# Patient Record
Sex: Female | Born: 1940 | Race: Black or African American | Hispanic: No | Smoking: Never smoker
Health system: Southern US, Community
[De-identification: ages and names within clinical notes are randomized; demographics above are authoritative.]

## PROBLEM LIST (undated history)

## (undated) DIAGNOSIS — M1990 Unspecified osteoarthritis, unspecified site: Secondary | ICD-10-CM

## (undated) DIAGNOSIS — E669 Obesity, unspecified: Secondary | ICD-10-CM

## (undated) DIAGNOSIS — R7303 Prediabetes: Secondary | ICD-10-CM

## (undated) DIAGNOSIS — H409 Unspecified glaucoma: Secondary | ICD-10-CM

## (undated) DIAGNOSIS — N6019 Diffuse cystic mastopathy of unspecified breast: Secondary | ICD-10-CM

## (undated) DIAGNOSIS — Z1211 Encounter for screening for malignant neoplasm of colon: Secondary | ICD-10-CM

## (undated) DIAGNOSIS — I10 Essential (primary) hypertension: Secondary | ICD-10-CM

## (undated) DIAGNOSIS — T7840XA Allergy, unspecified, initial encounter: Secondary | ICD-10-CM

## (undated) HISTORY — DX: Diffuse cystic mastopathy of unspecified breast: N60.19

## (undated) HISTORY — PX: BREAST BIOPSY: SHX20

## (undated) HISTORY — DX: Obesity, unspecified: E66.9

## (undated) HISTORY — DX: Unspecified glaucoma: H40.9

## (undated) HISTORY — DX: Unspecified osteoarthritis, unspecified site: M19.90

## (undated) HISTORY — DX: Allergy, unspecified, initial encounter: T78.40XA

## (undated) HISTORY — PX: TUBAL LIGATION: SHX77

## (undated) HISTORY — DX: Encounter for screening for malignant neoplasm of colon: Z12.11

---

## 1995-01-05 HISTORY — PX: OTHER SURGICAL HISTORY: SHX169

## 2004-11-30 ENCOUNTER — Ambulatory Visit: Payer: Self-pay

## 2005-11-02 ENCOUNTER — Ambulatory Visit: Payer: Self-pay | Admitting: Family Medicine

## 2006-01-18 ENCOUNTER — Other Ambulatory Visit: Payer: Self-pay

## 2006-01-18 ENCOUNTER — Ambulatory Visit: Payer: Self-pay | Admitting: General Surgery

## 2006-01-25 ENCOUNTER — Ambulatory Visit: Payer: Self-pay | Admitting: General Surgery

## 2006-11-08 ENCOUNTER — Ambulatory Visit: Payer: Self-pay | Admitting: General Surgery

## 2007-11-13 ENCOUNTER — Ambulatory Visit: Payer: Self-pay

## 2008-01-05 HISTORY — PX: COLONOSCOPY: SHX174

## 2008-04-03 ENCOUNTER — Ambulatory Visit: Payer: Self-pay

## 2008-06-14 ENCOUNTER — Ambulatory Visit: Payer: Self-pay | Admitting: Gastroenterology

## 2008-11-14 ENCOUNTER — Ambulatory Visit: Payer: Self-pay | Admitting: General Surgery

## 2009-11-17 ENCOUNTER — Ambulatory Visit: Payer: Self-pay | Admitting: General Surgery

## 2010-02-04 ENCOUNTER — Ambulatory Visit: Payer: Self-pay | Admitting: Family

## 2010-11-19 ENCOUNTER — Ambulatory Visit: Payer: Self-pay | Admitting: Family

## 2011-12-16 ENCOUNTER — Ambulatory Visit: Payer: Self-pay | Admitting: General Surgery

## 2012-05-19 ENCOUNTER — Other Ambulatory Visit: Payer: Self-pay | Admitting: Family Medicine

## 2012-05-19 LAB — LIPID PANEL
Cholesterol: 136 mg/dL (ref 0–200)
Ldl Cholesterol, Calc: 85 mg/dL (ref 0–100)
VLDL Cholesterol, Calc: 13 mg/dL (ref 5–40)

## 2012-05-19 LAB — BASIC METABOLIC PANEL
Anion Gap: 4 — ABNORMAL LOW (ref 7–16)
BUN: 10 mg/dL (ref 7–18)
Chloride: 107 mmol/L (ref 98–107)
Co2: 29 mmol/L (ref 21–32)
EGFR (African American): >60
Glucose: 95 mg/dL (ref 65–99)
Potassium: 4 mmol/L (ref 3.5–5.1)
Sodium: 140 mmol/L (ref 136–145)

## 2012-07-03 ENCOUNTER — Encounter: Payer: Self-pay | Admitting: *Deleted

## 2012-11-27 ENCOUNTER — Telehealth: Payer: Self-pay

## 2012-11-27 NOTE — Telephone Encounter (Signed)
Patient will follow up with her PCP from now on.

## 2012-11-27 NOTE — Telephone Encounter (Signed)
Message copied by Sinda Du on Mon Nov 27, 2012  8:51 AM ------      Message from: Kieth Brightly      Created: Mon Nov 27, 2012  8:33 AM      Regarding: RE: recalls       Please document this. Ok for her to follow with PCP      ----- Message -----         From: Sinda Du, LPN         Sent: 11/20/2012  10:31 AM           To: Kieth Brightly, MD      Subject: recalls                                                  Patient was in recalls for a yearly follow up screening mammogram. Patient called and says that she will be seeing her PCP Dr. Nira Conn at [facility] from now on for her breast exams and mammograms.       ------

## 2012-12-21 ENCOUNTER — Ambulatory Visit: Payer: Self-pay | Admitting: General Surgery

## 2013-01-04 HISTORY — PX: BREAST CYST ASPIRATION: SHX578

## 2013-01-05 LAB — HM DIABETES EYE EXAM

## 2013-01-24 ENCOUNTER — Ambulatory Visit: Payer: Self-pay | Admitting: Family Medicine

## 2013-01-24 LAB — HM MAMMOGRAPHY: HM Mammogram: NORMAL

## 2013-07-15 ENCOUNTER — Ambulatory Visit: Payer: Self-pay | Admitting: Physician Assistant

## 2013-07-15 LAB — URINALYSIS, COMPLETE
BILIRUBIN, UR: NEGATIVE
GLUCOSE, UR: NEGATIVE mg/dL (ref 0–75)
KETONE: NEGATIVE
LEUKOCYTE ESTERASE: NEGATIVE
Nitrite: NEGATIVE
Ph: 6 (ref 4.5–8.0)
Specific Gravity: 1.025 (ref 1.003–1.030)

## 2013-07-17 LAB — URINE CULTURE

## 2013-09-13 ENCOUNTER — Encounter: Payer: Self-pay | Admitting: *Deleted

## 2013-09-25 ENCOUNTER — Other Ambulatory Visit: Payer: Medicare Other

## 2013-09-25 ENCOUNTER — Encounter: Payer: Self-pay | Admitting: General Surgery

## 2013-09-25 ENCOUNTER — Ambulatory Visit (INDEPENDENT_AMBULATORY_CARE_PROVIDER_SITE_OTHER): Payer: Medicare Other | Admitting: General Surgery

## 2013-09-25 VITALS — BP 130/90 | HR 80 | Resp 14 | Ht 64.0 in | Wt 188.0 lb

## 2013-09-25 DIAGNOSIS — N644 Mastodynia: Secondary | ICD-10-CM

## 2013-09-25 NOTE — Patient Instructions (Signed)
Continue self breast exams. Call office for any new breast issues or concerns. 

## 2013-09-25 NOTE — Progress Notes (Signed)
[name], female   DOB: Sep 25, 1940, 73 y.o.   MRN: [id number]  Chief Complaint  Patient presents with  . Other    left breast soreness    HPI Natalie Gibbs is a 73 y.o. female.  who presents for a breast evaluation. The most recent mammogram was done in Jan 2015.  Patient does perform regular self breast checks and gets regular mammograms done.  States she is having tenderness in the left breast for about a week. States she can't feel anything different in the breast. Family history of breast cancer includes a niece.  She has a history of breast cyst and a core biopsy that was benign.  HPI  Past Medical History  Diagnosis Date  . Allergy   . Glaucoma   . Arthritis   . Obesity, unspecified   . Diabetes mellitus without complication   . Diffuse cystic mastopathy   . Special screening for malignant neoplasms, colon     Past Surgical History  Procedure Laterality Date  . Breast biopsy Left 2008    benign  . Tubal ligation    . Excision mass abdominal wall  1997  . Colonoscopy  2010    Dr. Candace Cruise, [facility]    Family History  Problem Relation Age of Onset  . Cancer Other     niece/breast    Social History History  Substance Use Topics  . Smoking status: Never Smoker   . Smokeless tobacco: Never Used  . Alcohol Use: No    Allergies  Allergen Reactions  . Iodine Itching  . Shellfish Allergy Itching  . Codeine Rash    Current Outpatient Prescriptions  Medication Sig Dispense Refill  . aspirin 81 MG tablet Take 81 mg by mouth daily.      . dorzolamide (TRUSOPT) 2 % ophthalmic solution 1 drop 3 (three) times daily.      . Travoprost, BAK Free, (TRAVATAN) 0.004 % SOLN ophthalmic solution Place 1 drop into both eyes at bedtime.       No current facility-administered medications for this visit.    Review of Systems Review of Systems  Constitutional: Negative.   Respiratory: Negative.   Cardiovascular: Negative.     Blood pressure 130/90,  pulse 80, resp. rate 14, height 5\' 4"  (1.626 m), weight 188 lb (85.276 kg).  Physical Exam Physical Exam  Constitutional: She is oriented to person, place, and time. She appears well-developed and well-nourished.  Eyes: Conjunctivae are normal. No scleral icterus.  Neck: Neck supple.  Cardiovascular: Normal rate, regular rhythm and normal heart sounds.   Pulmonary/Chest: Effort normal and breath sounds normal. Right breast exhibits no inverted nipple, no mass, no nipple discharge, no skin change and no tenderness. Left breast exhibits no inverted nipple, no mass, no nipple discharge, no skin change and no tenderness.  Her soreness is above the left nipple region  Lymphadenopathy:    She has no cervical adenopathy.    She has no axillary adenopathy.  Neurological: She is alert and oriented to person, place, and time.  Skin: Skin is warm and dry.  multiple keratotic lesions over her trunk    Data Reviewed Mammogram from January was reviewed showing prior biopsy clip above the left nipple but no abnormality otherwise.  Assessment    Stable physical exam. Ultrasound today shows a 4 mm shadowing area just above the left nipple and a 4 -5 mm hypoechoic irregular mass 3 cm above left nipple.[name]    Plan  Follow up office visit with left mammogram.       SANKAR,SEEPLAPUTHUR G 09/25/2013, 11:51 AM

## 2013-09-27 ENCOUNTER — Encounter: Payer: Self-pay | Admitting: General Surgery

## 2013-10-08 ENCOUNTER — Other Ambulatory Visit: Payer: Medicare Other

## 2013-10-08 ENCOUNTER — Encounter: Payer: Self-pay | Admitting: General Surgery

## 2013-10-08 ENCOUNTER — Ambulatory Visit (INDEPENDENT_AMBULATORY_CARE_PROVIDER_SITE_OTHER): Payer: Medicare Other | Admitting: General Surgery

## 2013-10-08 VITALS — BP 110/70 | HR 68 | Resp 12 | Ht 64.0 in | Wt 190.0 lb

## 2013-10-08 DIAGNOSIS — N63 Unspecified lump in breast: Secondary | ICD-10-CM

## 2013-10-08 DIAGNOSIS — N6320 Unspecified lump in the left breast, unspecified quadrant: Secondary | ICD-10-CM

## 2013-10-08 NOTE — Patient Instructions (Signed)

## 2013-10-08 NOTE — Progress Notes (Signed)
[name], female   DOB: October 22, 1940, 73 y.o.   MRN: [id number]  Chief Complaint  Patient presents with  . Follow-up    mammogram    HPI Natalie Gibbs is a 73 y.o. female who presents for a breast evaluation. She has a history of breast cyst and a core biopsy that was benign.     HPI  Past Medical History  Diagnosis Date  . Allergy   . Glaucoma   . Arthritis   . Obesity, unspecified   . Diabetes mellitus without complication   . Diffuse cystic mastopathy   . Special screening for malignant neoplasms, colon     Past Surgical History  Procedure Laterality Date  . Breast biopsy Left 2008    benign  . Tubal ligation    . Excision mass abdominal wall  1997  . Colonoscopy  2010    Dr. Candace Cruise, [facility]    Family History  Problem Relation Age of Onset  . Cancer Other     niece/breast    Social History History  Substance Use Topics  . Smoking status: Never Smoker   . Smokeless tobacco: Never Used  . Alcohol Use: No    Allergies  Allergen Reactions  . Iodine Itching  . Shellfish Allergy Itching  . Codeine Rash    Current Outpatient Prescriptions  Medication Sig Dispense Refill  . aspirin 81 MG tablet Take 81 mg by mouth daily.      . dorzolamide (TRUSOPT) 2 % ophthalmic solution 1 drop 3 (three) times daily.      . Travoprost, BAK Free, (TRAVATAN) 0.004 % SOLN ophthalmic solution Place 1 drop into both eyes at bedtime.       No current facility-administered medications for this visit.    Review of Systems Review of Systems  Constitutional: Negative.   Respiratory: Negative.   Cardiovascular: Negative.     Blood pressure 110/70, pulse 68, resp. rate 12, height 5\' 4"  (1.626 m), weight 190 lb (86.183 kg).  Physical Exam Physical Exam Unchanged from 09/25/13 Data Reviewed Left mammogram- no findings to correlate with [place]  Assessment  Patient was seen on 09/25/13 with complaint of pain above left nipple. Exam was normal. Ultrasound showed  a small shadowing mass above left nipple and a second small hypoechoic mass 3 cm from nipple. Left diagnostic mammogram is normal. Core biopsy of the shadowing mass recommended and patient agreeable. The second 14mm hypoechoic mass can be followed.    Plan    Core biopsy completed today. If path is benign will recheck in 3 mos.       Criss Alvine F 10/08/2013, 9:15 AM

## 2013-10-10 ENCOUNTER — Telehealth: Payer: Self-pay | Admitting: *Deleted

## 2013-10-10 LAB — PATHOLOGY

## 2013-10-10 NOTE — Telephone Encounter (Signed)
Message copied by Carson Myrtle on Wed Oct 10, 2013  3:56 PM ------      Message from: Christene Lye      Created: Wed Oct 10, 2013  1:13 PM       Please let pt pt know the pathology was normal. F/u as scheduled ------

## 2013-10-11 NOTE — Telephone Encounter (Signed)
Notified patient as instructed, patient pleased. Discussed follow-up appointments, patient agrees  

## 2013-10-15 ENCOUNTER — Ambulatory Visit (INDEPENDENT_AMBULATORY_CARE_PROVIDER_SITE_OTHER): Payer: Self-pay | Admitting: *Deleted

## 2013-10-15 DIAGNOSIS — N6320 Unspecified lump in the left breast, unspecified quadrant: Secondary | ICD-10-CM

## 2013-10-15 DIAGNOSIS — N63 Unspecified lump in breast: Secondary | ICD-10-CM

## 2013-10-15 NOTE — Progress Notes (Signed)
Patient here today for follow up post left breast biopsy.  Dressing removed, steristrip in place and aware it may come off in one week.  Minimal bruising noted.  The patient is aware that a heating pad may be used for comfort as needed.  Aware of pathology. Follow up as scheduled. 

## 2013-11-05 ENCOUNTER — Encounter: Payer: Self-pay | Admitting: General Surgery

## 2013-11-23 ENCOUNTER — Ambulatory Visit: Payer: Self-pay | Admitting: Gastroenterology

## 2014-01-16 ENCOUNTER — Ambulatory Visit: Payer: Medicare Other | Admitting: General Surgery

## 2014-01-22 ENCOUNTER — Other Ambulatory Visit: Payer: Commercial Managed Care - HMO

## 2014-01-22 ENCOUNTER — Encounter: Payer: Self-pay | Admitting: General Surgery

## 2014-01-22 ENCOUNTER — Ambulatory Visit (INDEPENDENT_AMBULATORY_CARE_PROVIDER_SITE_OTHER): Payer: Commercial Managed Care - HMO | Admitting: General Surgery

## 2014-01-22 VITALS — BP 130/70 | HR 82 | Resp 14 | Ht 64.0 in | Wt 188.0 lb

## 2014-01-22 DIAGNOSIS — N6002 Solitary cyst of left breast: Secondary | ICD-10-CM

## 2014-01-22 NOTE — Patient Instructions (Signed)
Continue self breast exams. Call office for any new breast issues or concerns. 

## 2014-01-22 NOTE — Progress Notes (Signed)
[name], female   DOB: [birth year], 74 y.o.   MRN: [id number]  Chief Complaint  Patient presents with  . Follow-up    HPI Natalie Gibbs is a 74 y.o. female.  Here today for follow up left breast ultrasound. She was having pain above the left nipple which has resolved. She has a history of breast cyst and a core biopsy that was benign. No new breast issues.  HPI  Past Medical History  Diagnosis Date  . Allergy   . Glaucoma   . Arthritis   . Obesity, unspecified   . Diabetes mellitus without complication   . Diffuse cystic mastopathy   . Special screening for malignant neoplasms, colon     Past Surgical History  Procedure Laterality Date  . Breast biopsy Left 2008    benign  . Tubal ligation    . Excision mass abdominal wall  1997  . Colonoscopy  2010    Dr. Candace Cruise, [facility]    Family History  Problem Relation Age of Onset  . Cancer Other     niece/breast    Social History History  Substance Use Topics  . Smoking status: Never Smoker   . Smokeless tobacco: Never Used  . Alcohol Use: No    Allergies  Allergen Reactions  . Iodine Itching  . Shellfish Allergy Itching  . Codeine Rash    Current Outpatient Prescriptions  Medication Sig Dispense Refill  . aspirin 81 MG tablet Take 81 mg by mouth daily.    . dorzolamide (TRUSOPT) 2 % ophthalmic solution 1 drop 3 (three) times daily.    . Travoprost, BAK Free, (TRAVATAN) 0.004 % SOLN ophthalmic solution Place 1 drop into both eyes at bedtime.     No current facility-administered medications for this visit.    Review of Systems Review of Systems  Constitutional: Negative.   Respiratory: Negative.   Cardiovascular: Negative.     Blood pressure 130/70, pulse 82, resp. rate 14, height 5\' 4"  (1.626 m), weight 188 lb (85.276 kg).  Physical Exam Physical Exam  Constitutional: She is oriented to person, place, and time. She appears well-developed and well-nourished.  Pulmonary/Chest: Right  breast exhibits no inverted nipple, no mass, no nipple discharge, no skin change and no tenderness. Left breast exhibits no inverted nipple, no mass, no nipple discharge, no skin change and no tenderness.  Lymphadenopathy:    She has no axillary adenopathy.  Neurological: She is alert and oriented to person, place, and time.  Skin: Skin is warm and dry.    Data Reviewed Previous breast ultrasound.   Assessment    Stable physical exam. [place] left breast repeated today. There is a 67mm cystic structure 1cm above nipple-at site of core biopsy done 3 mos ago-path stromal fibrosis.  3cm above nipple an irregular hypo to anochoic mass 7 by 5 mm is noted with some enhancement. Likely benign findings. Pt advised.    Plan     The patient has been asked to return to the office in 3 months with a bilateral screening mammogram.       SANKAR,SEEPLAPUTHUR G 01/22/2014, 10:08 AM

## 2014-02-21 DIAGNOSIS — H4011X2 Primary open-angle glaucoma, moderate stage: Secondary | ICD-10-CM | POA: Diagnosis not present

## 2014-03-06 DIAGNOSIS — N6019 Diffuse cystic mastopathy of unspecified breast: Secondary | ICD-10-CM | POA: Diagnosis not present

## 2014-03-06 DIAGNOSIS — E119 Type 2 diabetes mellitus without complications: Secondary | ICD-10-CM | POA: Diagnosis not present

## 2014-03-06 DIAGNOSIS — Z23 Encounter for immunization: Secondary | ICD-10-CM | POA: Diagnosis not present

## 2014-03-06 DIAGNOSIS — Z0000 Encounter for general adult medical examination without abnormal findings: Secondary | ICD-10-CM | POA: Diagnosis not present

## 2014-03-06 DIAGNOSIS — J3089 Other allergic rhinitis: Secondary | ICD-10-CM | POA: Diagnosis not present

## 2014-03-06 LAB — LIPID PANEL
Cholesterol: 132 mg/dL (ref 0–200)
HDL: 36 mg/dL (ref 35–70)
LDL CALC: 86 mg/dL
Triglycerides: 64 mg/dL (ref 40–160)

## 2014-03-06 LAB — HM DIABETES FOOT EXAM: HM Diabetic Foot Exam: NORMAL

## 2014-03-06 LAB — HEMOGLOBIN A1C: Hgb A1c MFr Bld: 6.2 % — AB (ref 4.0–6.0)

## 2014-03-06 LAB — BASIC METABOLIC PANEL
BUN: 12 mg/dL (ref 4–21)
Creatinine: 0.7 mg/dL (ref <=1.1)

## 2014-03-06 LAB — TSH: TSH: 1.6 u[IU]/mL (ref <=5.90)

## 2014-04-02 ENCOUNTER — Ambulatory Visit: Payer: Self-pay | Admitting: General Surgery

## 2014-04-02 DIAGNOSIS — Z1231 Encounter for screening mammogram for malignant neoplasm of breast: Secondary | ICD-10-CM | POA: Diagnosis not present

## 2014-04-05 ENCOUNTER — Encounter: Payer: Self-pay | Admitting: General Surgery

## 2014-04-10 ENCOUNTER — Encounter: Payer: Self-pay | Admitting: General Surgery

## 2014-04-10 ENCOUNTER — Ambulatory Visit (INDEPENDENT_AMBULATORY_CARE_PROVIDER_SITE_OTHER): Payer: Commercial Managed Care - HMO | Admitting: General Surgery

## 2014-04-10 VITALS — BP 124/82 | HR 72 | Resp 14 | Ht 64.0 in | Wt 189.0 lb

## 2014-04-10 DIAGNOSIS — N6019 Diffuse cystic mastopathy of unspecified breast: Secondary | ICD-10-CM

## 2014-04-10 NOTE — Progress Notes (Signed)
[name], female   DOB: [birth year], 74 y.o.   MRN: [id number]  Chief Complaint  Patient presents with  . Follow-up    mammogram    HPI Natalie Gibbs is a 74 y.o. female who presents for a breast evaluation. The most recent mammogram was done on 04/02/14.  Patient does perform regular self breast checks and gets regular mammograms done.    HPI  Past Medical History  Diagnosis Date  . Allergy   . Glaucoma   . Arthritis   . Obesity, unspecified   . Diabetes mellitus without complication   . Diffuse cystic mastopathy   . Special screening for malignant neoplasms, colon     Past Surgical History  Procedure Laterality Date  . Breast biopsy Left 2008    benign  . Tubal ligation    . Excision mass abdominal wall  1997  . Colonoscopy  2010    Dr. Candace Cruise, [facility]    Family History  Problem Relation Age of Onset  . Cancer Other     niece/breast    Social History History  Substance Use Topics  . Smoking status: Never Smoker   . Smokeless tobacco: Never Used  . Alcohol Use: No    Allergies  Allergen Reactions  . Iodine Itching  . Shellfish Allergy Itching  . Codeine Rash    Current Outpatient Prescriptions  Medication Sig Dispense Refill  . aspirin 81 MG tablet Take 81 mg by mouth daily.    . dorzolamide (TRUSOPT) 2 % ophthalmic solution 1 drop 3 (three) times daily.    . Travoprost, BAK Free, (TRAVATAN) 0.004 % SOLN ophthalmic solution Place 1 drop into both eyes at bedtime.     No current facility-administered medications for this visit.    Review of Systems Review of Systems  Constitutional: Negative.   Respiratory: Negative.   Cardiovascular: Negative.     Blood pressure 124/82, pulse 72, resp. rate 14, height 5\' 4"  (1.626 m), weight 189 lb (85.73 kg).  Physical Exam Physical Exam  Constitutional: She is oriented to person, place, and time. She appears well-developed and well-nourished.  Eyes: Conjunctivae are normal. No  scleral icterus.  Neck: Neck supple.  Cardiovascular: Normal rate, regular rhythm and normal heart sounds.   Pulmonary/Chest: Effort normal and breath sounds normal. Right breast exhibits no inverted nipple, no mass, no nipple discharge, no skin change and no tenderness. Left breast exhibits no inverted nipple, no mass, no nipple discharge, no skin change and no tenderness.  Abdominal: Soft. Bowel sounds are normal. There is no tenderness.  Lymphadenopathy:    She has no cervical adenopathy.    She has no axillary adenopathy.  Neurological: She is alert and oriented to person, place, and time.  Skin: Skin is warm and dry.  Keratotic lesions thoughout the body .     Data Reviewed Mammogram reviewed and stable  Assessment    Stable exam , Fibrocystic disease.      Plan    The patient has been asked to return to the office in one year with a bilateral screening mammogram.      PCP:  Levy Pupa 04/10/2014, 10:26 AM

## 2014-04-10 NOTE — Patient Instructions (Addendum)
The patient has been asked to return to the office in one year with a bilateral screening mammogram. Continue self breast exams. Call office for any new breast issues or concerns.  

## 2014-04-29 LAB — SURGICAL PATHOLOGY

## 2014-05-13 ENCOUNTER — Encounter
Admission: RE | Disposition: A | Discharge status: Home / Self Care | Payer: Self-pay | Source: Ambulatory Visit | Attending: Gastroenterology

## 2014-05-13 ENCOUNTER — Ambulatory Visit
Admission: RE | Admit: 2014-05-13 | Discharge: 2014-05-13 | Disposition: A | Discharge status: Home / Self Care | Payer: Commercial Managed Care - HMO | Source: Ambulatory Visit | Attending: Gastroenterology | Admitting: Gastroenterology

## 2014-05-13 ENCOUNTER — Encounter: Payer: Self-pay | Admitting: *Deleted

## 2014-05-13 ENCOUNTER — Ambulatory Visit: Payer: Commercial Managed Care - HMO | Admitting: Certified Registered"

## 2014-05-13 DIAGNOSIS — Z91013 Allergy to seafood: Secondary | ICD-10-CM | POA: Insufficient documentation

## 2014-05-13 DIAGNOSIS — K5730 Diverticulosis of large intestine without perforation or abscess without bleeding: Secondary | ICD-10-CM | POA: Diagnosis not present

## 2014-05-13 DIAGNOSIS — Z09 Encounter for follow-up examination after completed treatment for conditions other than malignant neoplasm: Secondary | ICD-10-CM | POA: Diagnosis not present

## 2014-05-13 DIAGNOSIS — D124 Benign neoplasm of descending colon: Secondary | ICD-10-CM | POA: Insufficient documentation

## 2014-05-13 DIAGNOSIS — Z91041 Radiographic dye allergy status: Secondary | ICD-10-CM | POA: Insufficient documentation

## 2014-05-13 DIAGNOSIS — Z885 Allergy status to narcotic agent status: Secondary | ICD-10-CM | POA: Diagnosis not present

## 2014-05-13 DIAGNOSIS — Z8371 Family history of colonic polyps: Secondary | ICD-10-CM | POA: Insufficient documentation

## 2014-05-13 DIAGNOSIS — Z86010 Personal history of colonic polyps: Secondary | ICD-10-CM | POA: Diagnosis not present

## 2014-05-13 DIAGNOSIS — K635 Polyp of colon: Secondary | ICD-10-CM | POA: Diagnosis not present

## 2014-05-13 DIAGNOSIS — Z1211 Encounter for screening for malignant neoplasm of colon: Secondary | ICD-10-CM | POA: Diagnosis not present

## 2014-05-13 HISTORY — PX: COLONOSCOPY: SHX5424

## 2014-05-13 SURGERY — COLONOSCOPY
Anesthesia: General

## 2014-05-13 MED ORDER — SODIUM CHLORIDE 0.9 % IV SOLN
INTRAVENOUS | Status: DC
  Administered 2014-05-13: 08:00:00 via INTRAVENOUS

## 2014-05-13 MED ORDER — LIDOCAINE HCL (CARDIAC) 20 MG/ML IV SOLN
INTRAVENOUS | Status: DC | PRN
  Administered 2014-05-13: 60 mg via INTRAVENOUS

## 2014-05-13 MED ORDER — PROPOFOL INFUSION 10 MG/ML OPTIME
INTRAVENOUS | Status: DC | PRN
  Administered 2014-05-13: 120 ug/kg/min via INTRAVENOUS

## 2014-05-13 MED ORDER — PROPOFOL INFUSION 10 MG/ML OPTIME: INTRAVENOUS | Status: DC | PRN

## 2014-05-13 MED ORDER — EPHEDRINE SULFATE 50 MG/ML IJ SOLN
INTRAMUSCULAR | Status: DC | PRN
  Administered 2014-05-13: 5 mg via INTRAVENOUS

## 2014-05-13 NOTE — Anesthesia Preprocedure Evaluation (Signed)
Anesthesia Evaluation  Patient identified by MRN, date of birth, ID band Patient awake    Reviewed: Allergy & Precautions, H&P , NPO status , Patient's Chart, lab work & pertinent test results, reviewed documented beta blocker date and time   Airway Mallampati: III  TM Distance: >3 FB Neck ROM: full    Dental no notable dental hx.    Pulmonary neg pulmonary ROS,  breath sounds clear to auscultation  Pulmonary exam normal       Cardiovascular Exercise Tolerance: Good negative cardio ROS  Rhythm:regular Rate:Normal     Neuro/Psych negative neurological ROS  negative psych ROS   GI/Hepatic negative GI ROS, Neg liver ROS,   Endo/Other  negative endocrine ROS  Renal/GU negative Renal ROS  negative genitourinary   Musculoskeletal   Abdominal   Peds  Hematology negative hematology ROS (+)   Anesthesia Other Findings   Reproductive/Obstetrics negative OB ROS                             Anesthesia Physical Anesthesia Plan  ASA: II  Anesthesia Plan: General   Post-op Pain Management:    Induction:   Airway Management Planned:   Additional Equipment:   Intra-op Plan:   Post-operative Plan:   Informed Consent: I have reviewed the patients History and Physical, chart, labs and discussed the procedure including the risks, benefits and alternatives for the proposed anesthesia with the patient or authorized representative who has indicated his/her understanding and acceptance.   Dental Advisory Given  Plan Discussed with: CRNA  Anesthesia Plan Comments:         Anesthesia Quick Evaluation

## 2014-05-13 NOTE — H&P (Signed)
  Here for repeat colon. Allergies:codeine,iodine PMH; see above Meds:see above  PE; NAD VSS CV:RRR Lungs:CTA Abdomen:NABS, soft, nontender, -HSM  Imp: Hx of colon polyps. Plan: Colon

## 2014-05-13 NOTE — Anesthesia Postprocedure Evaluation (Signed)
  Anesthesia Post-op Note  Patient: Natalie Gibbs  Procedure(s) Performed: Procedure(s): COLONOSCOPY (N/A)  Anesthesia type:General  Patient location: PACU  Post pain: Pain level controlled  Post assessment: Post-op Vital signs reviewed, Patient's Cardiovascular Status Stable, Respiratory Function Stable, Patent Airway and No signs of Nausea or vomiting  Post vital signs: Reviewed and stable  Last Vitals:  Filed Vitals:   05/13/14 0857  BP: 105/56  Pulse: 83  Temp: 35.9 C  Resp: 19    Level of consciousness: awake, alert  and patient cooperative  Complications: No apparent anesthesia complications

## 2014-05-13 NOTE — Transfer of Care (Signed)
Immediate Anesthesia Transfer of Care Note  Patient: Natalie Gibbs  Procedure(s) Performed: Procedure(s): COLONOSCOPY (N/A)  Patient Location: endoscopy unit  Anesthesia Type:General  Level of Consciousness: awake, alert , oriented and patient cooperative  Airway & Oxygen Therapy: Patient Spontanous Breathing and Patient connected to nasal cannula oxygen  Post-op Assessment: Report given to RN, Post -op Vital signs reviewed and stable and Patient moving all extremities X 4  Post vital signs: Reviewed and stable  Last Vitals:  Filed Vitals:   05/13/14 0857  BP: 105/56  Pulse: 83  Temp: 35.9 C  Resp: 19    Complications: No apparent anesthesia complications

## 2014-05-13 NOTE — Op Note (Signed)
[facility] Gastroenterology Patient Name: Natalie Gibbs Procedure Date: 05/13/2014 7:52 AM MRN: [id number] Account #: [id number] Date of Birth: 07-27-1940 Admit Type: Outpatient Age: 74 Room: [facility] ENDO ROOM 4 Gender: Female Note Status: Finalized Procedure:         Colonoscopy Indications:       Personal history of colonic polyps, F/U of large polyp in                     ascending colon from 11/15. Providers:         Lupita Dawn. Candace Cruise, MD Referring MD:      Forest Gleason Md, MD (Referring MD) Medicines:         Monitored Anesthesia Care Complications:     No immediate complications. Procedure:         Pre-Anesthesia Assessment:                    - Prior to the procedure, a History and Physical was                     performed, and patient medications, allergies and                     sensitivities were reviewed. The patient's tolerance of                     previous anesthesia was reviewed.                    - The risks and benefits of the procedure and the sedation                     options and risks were discussed with the patient. All                     questions were answered and informed consent was obtained.                    - After reviewing the risks and benefits, the patient was                     deemed in satisfactory condition to undergo the procedure.                    After obtaining informed consent, the colonoscope was                     passed under direct vision. Throughout the procedure, the                     patient's blood pressure, pulse, and oxygen saturations                     were monitored continuously. The Colonoscope was                     introduced through the anus and advanced to the the cecum,                     identified by appendiceal orifice and ileocecal valve. The                     colonoscopy was performed without difficulty. The patient  tolerated the procedure well. The quality of the bowel                     preparation was good. Findings:      Multiple small and large-mouthed diverticula were found in the sigmoid       colon.      A diminutive polyp was found in the descending colon. The polyp was       sessile. The polyp was removed with a jumbo cold forceps. Resection and       retrieval were complete.      No evidence of polyp left from previous polypectomy      The exam was otherwise without abnormality. Impression:        - Diverticulosis in the sigmoid colon.                    - One diminutive polyp in the descending colon. Resected                     and retrieved.                    - The examination was otherwise normal. Recommendation:    - Discharge patient to home.                    - Await pathology results.                    - Repeat colonoscopy in 3 years for surveillance.                    - The findings and recommendations were discussed with the                     patient. Procedure Code(s): --- Professional ---                    [phone], Colonoscopy, flexible; with biopsy, single or                     multiple Diagnosis Code(s): --- Professional ---                    D12.4, Benign neoplasm of descending colon                    Z86.010, Personal history of colonic polyps                    K57.30, Diverticulosis of large intestine without                     perforation or abscess without bleeding CPT copyright 2014 [employer]. All rights reserved. The codes documented in this report are preliminary and upon coder review may  be revised to meet current compliance requirements. Hulen Luster, MD 05/13/2014 8:52:54 AM This report has been signed electronically. Number of Addenda: 0 Note Initiated On: 05/13/2014 7:52 AM Scope Withdrawal Time: 0 hours 10 minutes 41 seconds  Total Procedure Duration: 0 hours 14 minutes 51 seconds       [facility]

## 2014-05-15 ENCOUNTER — Encounter: Payer: Self-pay | Admitting: Gastroenterology

## 2014-05-15 ENCOUNTER — Encounter: Payer: Self-pay | Admitting: Internal Medicine

## 2014-05-15 DIAGNOSIS — E1129 Type 2 diabetes mellitus with other diabetic kidney complication: Secondary | ICD-10-CM | POA: Insufficient documentation

## 2014-05-15 DIAGNOSIS — H409 Unspecified glaucoma: Secondary | ICD-10-CM | POA: Insufficient documentation

## 2014-05-15 DIAGNOSIS — R809 Proteinuria, unspecified: Secondary | ICD-10-CM

## 2014-05-15 DIAGNOSIS — T7840XA Allergy, unspecified, initial encounter: Secondary | ICD-10-CM | POA: Insufficient documentation

## 2014-05-15 DIAGNOSIS — N6019 Diffuse cystic mastopathy of unspecified breast: Secondary | ICD-10-CM | POA: Insufficient documentation

## 2014-05-15 DIAGNOSIS — R7303 Prediabetes: Secondary | ICD-10-CM | POA: Insufficient documentation

## 2014-05-15 LAB — HM COLONOSCOPY

## 2014-05-15 LAB — SURGICAL PATHOLOGY

## 2014-08-01 DIAGNOSIS — H2513 Age-related nuclear cataract, bilateral: Secondary | ICD-10-CM | POA: Diagnosis not present

## 2014-08-01 DIAGNOSIS — H5210 Myopia, unspecified eye: Secondary | ICD-10-CM | POA: Diagnosis not present

## 2014-08-01 DIAGNOSIS — H4011X2 Primary open-angle glaucoma, moderate stage: Secondary | ICD-10-CM | POA: Diagnosis not present

## 2014-08-01 DIAGNOSIS — H524 Presbyopia: Secondary | ICD-10-CM | POA: Diagnosis not present

## 2014-09-06 ENCOUNTER — Ambulatory Visit: Payer: Self-pay | Admitting: Internal Medicine

## 2014-09-19 ENCOUNTER — Ambulatory Visit: Payer: Self-pay | Admitting: Internal Medicine

## 2014-09-25 ENCOUNTER — Encounter: Payer: Self-pay | Admitting: Internal Medicine

## 2014-09-26 ENCOUNTER — Ambulatory Visit (INDEPENDENT_AMBULATORY_CARE_PROVIDER_SITE_OTHER): Payer: Commercial Managed Care - HMO | Admitting: Internal Medicine

## 2014-09-26 ENCOUNTER — Encounter: Payer: Self-pay | Admitting: Internal Medicine

## 2014-09-26 VITALS — BP 124/76 | HR 68 | Ht 64.0 in | Wt 182.8 lb

## 2014-09-26 DIAGNOSIS — E1129 Type 2 diabetes mellitus with other diabetic kidney complication: Secondary | ICD-10-CM | POA: Diagnosis not present

## 2014-09-26 DIAGNOSIS — H409 Unspecified glaucoma: Secondary | ICD-10-CM

## 2014-09-26 NOTE — Progress Notes (Signed)
Date:  09/26/2014   Name:  Natalie Gibbs   DOB:  [birth year]   MRN:  [id number]   Chief Complaint: Diabetes Diabetes She presents for her follow-up diabetic visit. She has type 2 diabetes mellitus. Her disease course has been stable (a1c 6.2). There are no hypoglycemic associated symptoms. Pertinent negatives for diabetes include no chest pain and no fatigue. There are no hypoglycemic complications. Symptoms are stable. Current diabetic treatment includes diet. Her breakfast blood glucose range is generally 110-130 mg/dl.   Review of Systems:  Review of Systems  Constitutional: Negative for fever, fatigue and unexpected weight change.  HENT: Negative for hearing loss.   Eyes: Positive for visual disturbance.  Respiratory: Negative for cough, shortness of breath and wheezing.   Cardiovascular: Negative for chest pain, palpitations and leg swelling.  Gastrointestinal: Negative for nausea and vomiting.  Genitourinary: Negative for dysuria and urgency.  Skin: Negative for rash.  Psychiatric/Behavioral: Negative for dysphoric mood.    Patient Active Problem List   Diagnosis Date Noted  . Type II diabetes mellitus with renal manifestations 05/15/2014  . Allergic state 05/15/2014  . Fibrocystic breast 05/15/2014  . Glaucoma 05/15/2014    Prior to Admission medications   Medication Sig Start Date End Date Taking? Authorizing Provider  aspirin 81 MG tablet Take 81 mg by mouth daily.   Yes Historical Provider, MD  dorzolamide (TRUSOPT) 2 % ophthalmic solution Place 1 drop into both eyes 2 (two) times daily.    Yes Historical Provider, MD  ibuprofen (ADVIL,MOTRIN) 200 MG tablet Take 200 mg by mouth every 6 (six) hours as needed for moderate pain.   Yes Historical Provider, MD  Multiple Vitamin (MULTIVITAMIN) tablet Take 1 tablet by mouth daily.   Yes Historical Provider, MD  Travoprost, BAK Free, (TRAVATAN) 0.004 % SOLN ophthalmic solution Place 1 drop into both eyes at bedtime.   Yes  Historical Provider, MD    Allergies  Allergen Reactions  . Iodine Itching  . Shellfish Allergy Itching  . Codeine Rash    Past Surgical History  Procedure Laterality Date  . Breast biopsy Left 2008    benign  . Tubal ligation    . Excision mass abdominal wall  1997  . Colonoscopy  2010    Dr. Candace Cruise, [facility]  . Colonoscopy N/A 05/13/2014    Procedure: COLONOSCOPY;  Surgeon: Hulen Luster, MD;  Location: [facility] ENDOSCOPY;  Service: Gastroenterology;  Laterality: N/A;  . Breast biopsy Left 10/2013    benign    Social History  Substance Use Topics  . Smoking status: Never Smoker   . Smokeless tobacco: Never Used  . Alcohol Use: No     Medication list has been reviewed and updated.  Physical Examination:  Physical Exam  Constitutional: She is oriented to person, place, and time. She appears well-developed. No distress.  HENT:  Head: Normocephalic and atraumatic.  Eyes: Conjunctivae are normal. Right eye exhibits no discharge. Left eye exhibits no discharge. No scleral icterus.  Neck: Normal range of motion. Neck supple. Carotid bruit is not present. No thyromegaly present.  Cardiovascular: Normal rate, regular rhythm and normal heart sounds.   Pulmonary/Chest: Effort normal and breath sounds normal. No respiratory distress. She has no wheezes.  Musculoskeletal: Normal range of motion.  Lymphadenopathy:    She has no cervical adenopathy.  Neurological: She is alert and oriented to person, place, and time.  Skin: Skin is warm and dry. No rash noted.  Psychiatric: She has a normal mood and  affect. Her behavior is normal. Thought content normal.  Nursing note and vitals reviewed.   BP 124/76 mmHg  Pulse 68  Ht 5\' 4"  (1.626 m)  Wt 182 lb 12.8 oz (82.918 kg)  BMI 31.36 kg/m2  Assessment and Plan: 1. Type 2 diabetes mellitus with other diabetic kidney complication Doing well with diet alone - Hemoglobin A1c  2. Glaucoma Referrals are up-to-date   Halina Maidens, MD [facility]  09/26/2014

## 2014-09-27 LAB — HEMOGLOBIN A1C
Est. average glucose Bld gHb Est-mCnc: 126 mg/dL
Hgb A1c MFr Bld: 6 % — ABNORMAL HIGH (ref 4.8–5.6)

## 2014-10-31 DIAGNOSIS — H401122 Primary open-angle glaucoma, left eye, moderate stage: Secondary | ICD-10-CM | POA: Diagnosis not present

## 2014-10-31 DIAGNOSIS — H40033 Anatomical narrow angle, bilateral: Secondary | ICD-10-CM | POA: Diagnosis not present

## 2014-12-09 DIAGNOSIS — H2511 Age-related nuclear cataract, right eye: Secondary | ICD-10-CM | POA: Diagnosis not present

## 2014-12-09 DIAGNOSIS — H18411 Arcus senilis, right eye: Secondary | ICD-10-CM | POA: Diagnosis not present

## 2014-12-09 DIAGNOSIS — H2512 Age-related nuclear cataract, left eye: Secondary | ICD-10-CM | POA: Diagnosis not present

## 2014-12-09 DIAGNOSIS — H02839 Dermatochalasis of unspecified eye, unspecified eyelid: Secondary | ICD-10-CM | POA: Diagnosis not present

## 2014-12-09 DIAGNOSIS — H402290 Chronic angle-closure glaucoma, unspecified eye, stage unspecified: Secondary | ICD-10-CM | POA: Diagnosis not present

## 2014-12-09 DIAGNOSIS — H402220 Chronic angle-closure glaucoma, left eye, stage unspecified: Secondary | ICD-10-CM | POA: Diagnosis not present

## 2015-01-05 HISTORY — PX: CATARACT EXTRACTION, BILATERAL: SHX1313

## 2015-01-05 HISTORY — PX: EYE SURGERY: SHX253

## 2015-01-30 DIAGNOSIS — H2513 Age-related nuclear cataract, bilateral: Secondary | ICD-10-CM | POA: Diagnosis not present

## 2015-01-30 DIAGNOSIS — H269 Unspecified cataract: Secondary | ICD-10-CM | POA: Diagnosis not present

## 2015-01-30 DIAGNOSIS — H2512 Age-related nuclear cataract, left eye: Secondary | ICD-10-CM | POA: Diagnosis not present

## 2015-01-31 DIAGNOSIS — H2511 Age-related nuclear cataract, right eye: Secondary | ICD-10-CM | POA: Diagnosis not present

## 2015-02-04 ENCOUNTER — Encounter: Payer: Self-pay | Admitting: Internal Medicine

## 2015-02-04 ENCOUNTER — Ambulatory Visit (INDEPENDENT_AMBULATORY_CARE_PROVIDER_SITE_OTHER): Payer: Commercial Managed Care - HMO | Admitting: Internal Medicine

## 2015-02-04 ENCOUNTER — Other Ambulatory Visit: Payer: Self-pay | Admitting: *Deleted

## 2015-02-04 VITALS — BP 118/66 | HR 76 | Temp 98.0°F | Ht 64.0 in | Wt 183.0 lb

## 2015-02-04 DIAGNOSIS — N3000 Acute cystitis without hematuria: Secondary | ICD-10-CM

## 2015-02-04 DIAGNOSIS — Z1231 Encounter for screening mammogram for malignant neoplasm of breast: Secondary | ICD-10-CM

## 2015-02-04 LAB — POC URINALYSIS WITH MICROSCOPIC (NON AUTO)MANUAL RESULT
BILIRUBIN UA: NEGATIVE
Crystals: 0
Epithelial cells, urine per micros: 2
GLUCOSE UA: NEGATIVE
KETONES UA: NEGATIVE
Mucus, UA: 0
NITRITE UA: NEGATIVE
RBC: 2 M/uL — AB (ref 4.04–5.48)
Spec Grav, UA: 1.02
Urobilinogen, UA: 1
WBC Casts, UA: 5
pH, UA: 6

## 2015-02-04 MED ORDER — CIPROFLOXACIN HCL 250 MG PO TABS: 250.0000 mg | ORAL_TABLET | Freq: Two times a day (BID) | ORAL | Status: DC

## 2015-02-04 NOTE — Progress Notes (Signed)
Date:  02/04/2015   Name:  Natalie Gibbs   DOB:  26-Dec-1940   MRN:  [id number]   Chief Complaint: Urinary Frequency Urinary Frequency  This is a new problem. The current episode started in the past 7 days. The problem occurs every urination. The problem has been unchanged. The quality of the pain is described as burning. The pain is mild. There has been no fever. Associated symptoms include frequency and urgency. Pertinent negatives include no chills, hematuria, nausea or vomiting.  Diabetes She presents for her follow-up diabetic visit. She has type 2 diabetes mellitus. Her disease course has been stable. Pertinent negatives for diabetes include no fatigue. Risk factors for coronary artery disease include diabetes mellitus. Current diabetic treatment includes diet. Her breakfast blood glucose is taken between 6-7 am. Her breakfast blood glucose range is generally 90-110 mg/dl.     Review of Systems  Constitutional: Negative for fever, chills and fatigue.  Eyes: Positive for visual disturbance (had left cataract surgery; now having right next month).  Gastrointestinal: Negative for nausea, vomiting, abdominal pain, diarrhea and abdominal distention.  Genitourinary: Positive for dysuria, urgency and frequency. Negative for hematuria and vaginal discharge.  Musculoskeletal: Negative for back pain.    Patient Active Problem List   Diagnosis Date Noted  . Type II diabetes mellitus with renal manifestations ([place]) 05/15/2014  . Allergic state 05/15/2014  . Fibrocystic breast 05/15/2014  . Glaucoma 05/15/2014    Prior to Admission medications   Medication Sig Start Date End Date Taking? Authorizing Provider  aspirin 81 MG tablet Take 81 mg by mouth daily.   Yes Historical Provider, MD  dorzolamide (TRUSOPT) 2 % ophthalmic solution Place 1 drop into both eyes 2 (two) times daily.    Yes Historical Provider, MD  Travoprost, BAK Free, (TRAVATAN) 0.004 % SOLN ophthalmic solution  Place 1 drop into both eyes at bedtime.   Yes Historical Provider, MD    Allergies  Allergen Reactions  . Iodine Itching  . Shellfish Allergy Itching  . Codeine Rash    Past Surgical History  Procedure Laterality Date  . Breast biopsy Left 2008    benign  . Tubal ligation    . Excision mass abdominal wall  1997  . Colonoscopy  2010    Dr. Candace Cruise, [facility]  . Colonoscopy N/A 05/13/2014    Procedure: COLONOSCOPY;  Surgeon: Hulen Luster, MD;  Location: [facility] ENDOSCOPY;  Service: Gastroenterology;  Laterality: N/A;  . Breast biopsy Left 10/2013    benign    Social History  Substance Use Topics  . Smoking status: Never Smoker   . Smokeless tobacco: Never Used  . Alcohol Use: No   Medication list has been reviewed and updated.   Physical Exam  Constitutional: She is oriented to person, place, and time. She appears well-developed. No distress.  Cardiovascular: Normal rate, regular rhythm and normal heart sounds.   Pulmonary/Chest: Effort normal and breath sounds normal.  Abdominal: Soft. Bowel sounds are normal. She exhibits no mass. There is tenderness in the suprapubic area. There is no guarding.  Musculoskeletal: She exhibits no edema.  Neurological: She is alert and oriented to person, place, and time.  Nursing note and vitals reviewed.   BP 118/66 mmHg  Pulse 76  Temp(Src) 98 F (36.7 C) (Oral)  Ht 5\' 4"  (1.626 m)  Wt 183 lb (83.008 kg)  BMI 31.40 kg/m2  Assessment and Plan: 1. Acute cystitis without hematuria Increase fluid intake - ciprofloxacin (CIPRO) 250 MG  tablet; Take 1 tablet (250 mg total) by mouth 2 (two) times daily.  Dispense: 14 tablet; Refill: 0 - POC urinalysis w microscopic (non auto)   Halina Maidens, MD [facility]  02/04/2015

## 2015-02-05 DIAGNOSIS — H2513 Age-related nuclear cataract, bilateral: Secondary | ICD-10-CM | POA: Diagnosis not present

## 2015-02-12 ENCOUNTER — Encounter: Payer: Self-pay | Admitting: *Deleted

## 2015-02-20 DIAGNOSIS — H40211 Acute angle-closure glaucoma, right eye: Secondary | ICD-10-CM | POA: Diagnosis not present

## 2015-02-20 DIAGNOSIS — H2511 Age-related nuclear cataract, right eye: Secondary | ICD-10-CM | POA: Diagnosis not present

## 2015-02-20 DIAGNOSIS — H2513 Age-related nuclear cataract, bilateral: Secondary | ICD-10-CM | POA: Diagnosis not present

## 2015-02-21 DIAGNOSIS — H2511 Age-related nuclear cataract, right eye: Secondary | ICD-10-CM | POA: Diagnosis not present

## 2015-03-10 ENCOUNTER — Ambulatory Visit (INDEPENDENT_AMBULATORY_CARE_PROVIDER_SITE_OTHER): Payer: Commercial Managed Care - HMO | Admitting: Internal Medicine

## 2015-03-10 ENCOUNTER — Encounter: Payer: Self-pay | Admitting: Internal Medicine

## 2015-03-10 VITALS — BP 108/70 | HR 84 | Ht 64.0 in | Wt 185.8 lb

## 2015-03-10 DIAGNOSIS — Z0000 Encounter for general adult medical examination without abnormal findings: Secondary | ICD-10-CM | POA: Diagnosis not present

## 2015-03-10 DIAGNOSIS — E1121 Type 2 diabetes mellitus with diabetic nephropathy: Secondary | ICD-10-CM

## 2015-03-10 DIAGNOSIS — Z23 Encounter for immunization: Secondary | ICD-10-CM

## 2015-03-10 DIAGNOSIS — N6019 Diffuse cystic mastopathy of unspecified breast: Secondary | ICD-10-CM

## 2015-03-10 LAB — POCT URINALYSIS DIPSTICK
BILIRUBIN UA: NEGATIVE
Blood, UA: NEGATIVE
GLUCOSE UA: NEGATIVE
Ketones, UA: NEGATIVE
Leukocytes, UA: NEGATIVE
Nitrite, UA: NEGATIVE
PH UA: 7
SPEC GRAV UA: 1.02
Urobilinogen, UA: 0.2

## 2015-03-10 NOTE — Patient Instructions (Addendum)
Health Maintenance  Topic Date Due  . TETANUS/TDAP  03/06/1961  . ZOSTAVAX  03/07/2002  . DEXA SCAN  03/07/2007  . FOOT EXAM  03/06/2015  . URINE MICROALBUMIN  03/06/2015  . INFLUENZA VACCINE  08/05/2015 (Originally 08/05/2014)  . HEMOGLOBIN A1C  03/26/2015  . MAMMOGRAM  04/02/2015  . OPHTHALMOLOGY EXAM  01/19/2016  . COLONOSCOPY  05/14/2017  . PNA vac Low Risk Adult  Completed   Tdap Vaccine (Tetanus, Diphtheria and Pertussis): What You Need to Know 1. Why get vaccinated? Tetanus, diphtheria and pertussis are very serious diseases. Tdap vaccine can protect [place] from these diseases. And, Tdap vaccine given to pregnant women can protect newborn babies against pertussis. TETANUS (Lockjaw) is rare in the [place] today. It causes painful muscle tightening and stiffness, usually all over the body.  It can lead to tightening of muscles in the head and neck so you can't open your mouth, swallow, or sometimes even breathe. Tetanus kills about 1 out of 10 people who are infected even after receiving the best medical care. DIPHTHERIA is also rare in the [place] today. It can cause a thick coating to form in the back of the throat.  It can lead to breathing problems, heart failure, paralysis, and death. PERTUSSIS (Whooping Cough) causes severe coughing spells, which can cause difficulty breathing, vomiting and disturbed sleep.  It can also lead to weight loss, incontinence, and rib fractures. Up to 2 in 100 adolescents and 5 in 100 adults with pertussis are hospitalized or have complications, which could include pneumonia or death. These diseases are caused by bacteria. Diphtheria and pertussis are spread from person to person through secretions from coughing or sneezing. Tetanus enters the body through cuts, scratches, or wounds. Before vaccines, as many as 200,000 cases of diphtheria, 200,000 cases of pertussis, and hundreds of cases of tetanus, were reported in the [place] each year.  Since vaccination began, reports of cases for tetanus and diphtheria have dropped by about 99% and for pertussis by about 80%. 2. Tdap vaccine Tdap vaccine can protect adolescents and adults from tetanus, diphtheria, and pertussis. One dose of Tdap is routinely given at age 50 or 40. People who did not get Tdap at that age should get it as soon as possible. Tdap is especially important for healthcare professionals and anyone having close contact with a baby younger than 12 months. Pregnant women should get a dose of Tdap during every pregnancy, to protect the newborn from pertussis. Infants are most at risk for severe, life-threatening complications from pertussis. Another vaccine, called Td, protects against tetanus and diphtheria, but not pertussis. A Td booster should be given every 10 years. Tdap may be given as one of these boosters if you have never gotten Tdap before. Tdap may also be given after a severe cut or burn to prevent tetanus infection. Your doctor or the person giving you the vaccine can give you more information. Tdap may safely be given at the same time as other vaccines. 3. Some people should not get this vaccine  A person who has ever had a life-threatening allergic reaction after a previous dose of any diphtheria, tetanus or pertussis containing vaccine, OR has a severe allergy to any part of this vaccine, should not get Tdap vaccine. Tell the person giving the vaccine about any severe allergies.  Anyone who had coma or long repeated seizures within 7 days after a childhood dose of DTP or DTaP, or a previous dose of Tdap, should  not get Tdap, unless a cause other than the vaccine was found. They can still get Td.  Talk to your doctor if you:  have seizures or another nervous system problem,  had severe pain or swelling after any vaccine containing diphtheria, tetanus or pertussis,  ever had a condition called Guillain-Barr Syndrome (GBS),  aren't feeling well on the day  the shot is scheduled. 4. Risks With any medicine, including vaccines, there is a chance of side effects. These are usually mild and go away on their own. Serious reactions are also possible but are rare. Most people who get Tdap vaccine do not have any problems with it. Mild problems following Tdap (Did not interfere with activities)  Pain where the shot was given (about 3 in 4 adolescents or 2 in 3 adults)  Redness or swelling where the shot was given (about 1 person in 5)  Mild fever of at least 100.10F (up to about 1 in 25 adolescents or 1 in 100 adults)  Headache (about 3 or 4 people in 10)  Tiredness (about 1 person in 3 or 4)  Nausea, vomiting, diarrhea, stomach ache (up to 1 in 4 adolescents or 1 in 10 adults)  Chills, sore joints (about 1 person in 10)  Body aches (about 1 person in 3 or 4)  Rash, swollen glands (uncommon) Moderate problems following Tdap (Interfered with activities, but did not require medical attention)  Pain where the shot was given (up to 1 in 5 or 6)  Redness or swelling where the shot was given (up to about 1 in 16 adolescents or 1 in 12 adults)  Fever over 102F (about 1 in 100 adolescents or 1 in 250 adults)  Headache (about 1 in 7 adolescents or 1 in 10 adults)  Nausea, vomiting, diarrhea, stomach ache (up to 1 or 3 people in 100)  Swelling of the entire arm where the shot was given (up to about 1 in 500). Severe problems following Tdap (Unable to perform usual activities; required medical attention)  Swelling, severe pain, bleeding and redness in the arm where the shot was given (rare). Problems that could happen after any vaccine:  People sometimes faint after a medical procedure, including vaccination. Sitting or lying down for about 15 minutes can help prevent fainting, and injuries caused by a fall. Tell your doctor if you feel dizzy, or have vision changes or ringing in the ears.  Some people get severe pain in the shoulder and  have difficulty moving the arm where a shot was given. This happens very rarely.  Any medication can cause a severe allergic reaction. Such reactions from a vaccine are very rare, estimated at fewer than 1 in a million doses, and would happen within a few minutes to a few hours after the vaccination. As with any medicine, there is a very remote chance of a vaccine causing a serious injury or death. The safety of vaccines is always being monitored. For more information, visit: http://www.aguilar.org/ 5. What if there is a serious problem? What should I look for?  Look for anything that concerns you, such as signs of a severe allergic reaction, very high fever, or unusual behavior.  Signs of a severe allergic reaction can include hives, swelling of the face and throat, difficulty breathing, a fast heartbeat, dizziness, and weakness. These would usually start a few minutes to a few hours after the vaccination. What should I do?  If you think it is a severe allergic reaction or other emergency  that can't wait, call 9-1-1 or get the person to the nearest hospital. Otherwise, call your doctor.  Afterward, the reaction should be reported to the Vaccine Adverse Event Reporting System (VAERS). Your doctor might file this report, or you can do it yourself through the VAERS web site at www.vaers.SamedayNews.es, or by calling (443) 150-0909. VAERS does not give medical advice.  6. The National Vaccine Injury Compensation Program The [employer] Vaccine Injury Compensation Program (VICP) is a federal program that was created to compensate people who may have been injured by certain vaccines. Persons who believe they may have been injured by a vaccine can learn about the program and about filing a claim by calling 236-547-3637 or visiting the [employer] website at GoldCloset.com.ee. There is a time limit to file a claim for compensation. 7. How can I learn more?  Ask your doctor. He or she can give you  the vaccine package insert or suggest other sources of information.  Call your local or state health department.  Contact the Centers for Disease Control and Prevention (CDC):  Call 854 629 7729 (1-800-CDC-INFO) or  Visit CDC's website at http://hunter.com/ CDC Tdap Vaccine VIS (02/27/13)   This information is not intended to replace advice given to you by your health care provider. Make sure you discuss any questions you have with your health care provider.   Document Released: 06/22/2011 Document Revised: 01/11/2014 Document Reviewed: 04/04/2013 Elsevier Interactive Patient Education [employer].

## 2015-03-10 NOTE — Progress Notes (Signed)
Patient: Natalie Gibbs, Female    DOB: September 28, 1940, 75 y.o.   MRN: [id number] Visit Date: 03/10/2015  Today's Provider: Halina Maidens, MD   Chief Complaint  Patient presents with  . Medicare Wellness   Subjective:    Annual wellness visit Natalie Gibbs is a 75 y.o. female who presents today for her Subsequent Annual Wellness Visit. She feels well. She reports exercising rarely - home exercise bike and treadmill. She reports she is sleeping well. She has a mammogram scheduled in April.  She also will see general surgery for followup from her breast biopsy last year.   She remains very active caring for her daughter with MS.  ----------------------------------------------------------- Diabetes She presents for her follow-up diabetic visit. She has type 2 diabetes mellitus. Her disease course has been stable. Pertinent negatives for hypoglycemia include no dizziness or headaches. Pertinent negatives for diabetes include no chest pain and no fatigue. Her weight is stable. She is following a generally healthy diet. Her breakfast blood glucose is taken between 6-7 am. Her breakfast blood glucose range is generally 110-130 mg/dl. Her dinner blood glucose is taken between 6-7 pm. Her dinner blood glucose range is generally 110-130 mg/dl. An ACE inhibitor/angiotensin II receptor blocker is not being taken. Eye exam is current.    Review of Systems  Constitutional: Negative for fever, diaphoresis, fatigue and unexpected weight change.  HENT: Negative for sore throat, tinnitus and trouble swallowing.   Respiratory: Negative for cough, chest tightness and shortness of breath.   Cardiovascular: Negative for chest pain, palpitations and leg swelling.  Gastrointestinal: Negative for abdominal pain, diarrhea and constipation.  Genitourinary: Negative for dysuria and hematuria.  Musculoskeletal: Negative for back pain and arthralgias.  Skin: Negative for color change and rash.    Neurological: Negative for dizziness, light-headedness and headaches.  Hematological: Negative for adenopathy. Does not bruise/bleed easily.  Psychiatric/Behavioral: Negative for sleep disturbance and dysphoric mood.    Social History   Social History  . Marital Status: Married    Spouse Name: N/A  . Number of Children: N/A  . Years of Education: N/A   Occupational History  . Not on file.   Social History Main Topics  . Smoking status: Never Smoker   . Smokeless tobacco: Never Used  . Alcohol Use: No  . Drug Use: No  . Sexual Activity: Not on file   Other Topics Concern  . Not on file   Social History Narrative    Patient Active Problem List   Diagnosis Date Noted  . Type II diabetes mellitus with renal manifestations ([place]) 05/15/2014  . Allergic state 05/15/2014  . Fibrocystic breast 05/15/2014  . Glaucoma 05/15/2014    Past Surgical History  Procedure Laterality Date  . Breast biopsy Left 2008    benign  . Tubal ligation    . Excision mass abdominal wall  1997  . Colonoscopy  2010    Dr. Candace Cruise, [facility]  . Colonoscopy N/A 05/13/2014    Procedure: COLONOSCOPY;  Surgeon: Hulen Luster, MD;  Location: [facility] ENDOSCOPY;  Service: Gastroenterology;  Laterality: N/A;  . Breast biopsy Left 10/2013    benign  . Cataract extraction, bilateral Bilateral 2017    Her family history includes Breast cancer in her other; Diabetes in her sister.    Previous Medications   ASPIRIN 81 MG TABLET    Take 81 mg by mouth daily.   DORZOLAMIDE (TRUSOPT) 2 % OPHTHALMIC SOLUTION    Place 1 drop into both eyes  2 (two) times daily.    TRAVOPROST, BAK FREE, (TRAVATAN) 0.004 % SOLN OPHTHALMIC SOLUTION    Place 1 drop into both eyes at bedtime.    Patient Care Team: Glean Hess, MD as PCP - General (Family Medicine) Christene Lye, MD (General Surgery) Glean Hess, MD (Family Medicine)     Objective:   Vitals: BP 108/70 mmHg  Pulse 84  Ht 5\' 4"  (1.626 m)  Wt 185 lb 12.8  oz (84.278 kg)  BMI 31.88 kg/m2  Physical Exam  Constitutional: She is oriented to person, place, and time. She appears well-developed and well-nourished. No distress.  HENT:  Head: Normocephalic and atraumatic.  Right Ear: Tympanic membrane and ear canal normal.  Left Ear: Tympanic membrane and ear canal normal.  Nose: Right sinus exhibits no maxillary sinus tenderness. Left sinus exhibits no maxillary sinus tenderness.  Mouth/Throat: Uvula is midline and oropharynx is clear and moist.  Eyes: Conjunctivae and EOM are normal. Right eye exhibits no discharge. Left eye exhibits no discharge. No scleral icterus.  Neck: Normal range of motion. Carotid bruit is not present. No erythema present. No thyromegaly present.  Cardiovascular: Normal rate, regular rhythm, normal heart sounds and normal pulses.   Pulmonary/Chest: Effort normal. No respiratory distress. She has no wheezes. Right breast exhibits no mass, no nipple discharge, no skin change and no tenderness. Left breast exhibits no mass, no nipple discharge, no skin change and no tenderness.  Abdominal: Soft. Bowel sounds are normal. There is no hepatosplenomegaly. There is no tenderness. There is no CVA tenderness.  Musculoskeletal: Normal range of motion.  Lymphadenopathy:    She has no cervical adenopathy.    She has no axillary adenopathy.  Neurological: She is alert and oriented to person, place, and time. She has normal reflexes. No cranial nerve deficit or sensory deficit.  Skin: Skin is warm, dry and intact. No rash noted.  Multiple skin tags and SK over chest and back  Psychiatric: She has a normal mood and affect. Her speech is normal and behavior is normal. Thought content normal.  Nursing note and vitals reviewed.   Activities of Daily Living In your present state of health, do you have any difficulty performing the following activities: 02/04/2015  Hearing? N  Vision? N  Difficulty concentrating or making decisions? N   Walking or climbing stairs? N  Dressing or bathing? N  Doing errands, shopping? N    Fall Risk Assessment Fall Risk  02/04/2015  Falls in the past year? No      Depression Screen PHQ 2/9 Scores 02/04/2015  PHQ - 2 Score 0    Cognitive Testing - 6-CIT   Correct? Score   What year is it? yes 0 Yes = 0    No = 4  What month is it? yes 0 Yes = 0    No = 3  Remember:     Pia [address], [place], [place]     What time is it? yes 0 Yes = 0    No = 3  Count backwards from 20 to 1 yes 0 Correct = 0    1 error = 2   More than 1 error = 4  Say the months of the year in reverse. yes 0 Correct = 0    1 error = 2   More than 1 error = 4  What address did I ask you to remember? no 2 Correct = 0  1 error = 2  2 error = 4    3 error = 6    4 error = 8    All wrong = 10       TOTAL SCORE  2/28   Interpretation:  Normal  Normal (0-7) Abnormal (8-28)        Medicare Annual Wellness Visit Summary:  Reviewed patient's Family Medical History Reviewed and updated list of patient's medical providers Assessment of cognitive impairment was done Assessed patient's functional ability Established a written schedule for health screening [place] Completed and Reviewed  Exercise Activities and Dietary recommendations Goals    . Increase physical activity       Immunization History  Administered Date(s) Administered  . Pneumococcal Conjugate-13 03/06/2014  . Pneumococcal Polysaccharide-23 03/06/2008    Health Maintenance  Topic Date Due  . TETANUS/TDAP  03/06/1961  . ZOSTAVAX  03/07/2002  . DEXA SCAN  03/07/2007  . FOOT EXAM  03/06/2015  . URINE MICROALBUMIN  03/06/2015  . INFLUENZA VACCINE  08/05/2015 (Originally 08/05/2014)  . HEMOGLOBIN A1C  03/26/2015  . MAMMOGRAM  04/02/2015  . OPHTHALMOLOGY EXAM  01/19/2016  . COLONOSCOPY  05/14/2017  . PNA vac Low Risk Adult  Completed     Discussed health benefits of physical activity, and encouraged her to  engage in regular exercise appropriate for her age and condition.    ------------------------------------------------------------------------------------------------------------   Assessment & Plan:  1. Medicare annual wellness visit, subsequent Measures satisfied - POCT urinalysis dipstick  2. Type 2 diabetes mellitus with diabetic nephropathy, without long-term current use of insulin (HCC) Controlled with diet If microalbumin elevated will start ACEI or ARB (discussed with patient) - Microalbumin / creatinine urine ratio - CBC with Differential/Platelet - Comprehensive metabolic panel - Hemoglobin A1c - Lipid panel - TSH  3. Fibrocystic breast, unspecified laterality followup with mammogram and Gen Surg as planned  4. Need for diphtheria-tetanus-pertussis (Tdap) vaccine - Tdap vaccine greater than or equal to 7yo IM   Halina Maidens, MD [facility]  03/10/2015

## 2015-03-11 ENCOUNTER — Other Ambulatory Visit: Payer: Self-pay | Admitting: Internal Medicine

## 2015-03-11 LAB — COMPREHENSIVE METABOLIC PANEL
A/G RATIO: 1.2 (ref 1.1–2.5)
ALK PHOS: 88 IU/L (ref 39–117)
ALT: 16 IU/L (ref 0–32)
AST: 13 IU/L (ref 0–40)
Albumin: 4.1 g/dL (ref 3.5–4.8)
BUN/Creatinine Ratio: 19 (ref 11–26)
BUN: 12 mg/dL (ref 8–27)
Bilirubin Total: 0.4 mg/dL (ref 0.0–1.2)
CHLORIDE: 102 mmol/L (ref 96–106)
CO2: 24 mmol/L (ref 18–29)
Calcium: 8.9 mg/dL (ref 8.7–10.3)
Creatinine, Ser: 0.64 mg/dL (ref 0.57–1.00)
GFR calc Af Amer: 102 mL/min/{1.73_m2} (ref >59)
GFR, EST NON AFRICAN AMERICAN: 89 mL/min/{1.73_m2} (ref >59)
GLOBULIN, TOTAL: 3.4 g/dL (ref 1.5–4.5)
Glucose: 107 mg/dL — ABNORMAL HIGH (ref 65–99)
POTASSIUM: 4.5 mmol/L (ref 3.5–5.2)
SODIUM: 142 mmol/L (ref 134–144)
Total Protein: 7.5 g/dL (ref 6.0–8.5)

## 2015-03-11 LAB — MICROALBUMIN / CREATININE URINE RATIO
CREATININE, UR: 110.4 mg/dL
MICROALB/CREAT RATIO: 59.7 mg/g creat — ABNORMAL HIGH (ref 0.0–30.0)
MICROALBUM., U, RANDOM: 65.9 ug/mL

## 2015-03-11 LAB — CBC WITH DIFFERENTIAL/PLATELET
BASOS: 0 %
Basophils Absolute: 0 10*3/uL (ref 0.0–0.2)
EOS (ABSOLUTE): 0.1 10*3/uL (ref 0.0–0.4)
EOS: 1 %
HEMATOCRIT: 38.2 % (ref 34.0–46.6)
Hemoglobin: 12.2 g/dL (ref 11.1–15.9)
IMMATURE GRANULOCYTES: 0 %
Immature Grans (Abs): 0 10*3/uL (ref 0.0–0.1)
LYMPHS ABS: 2.2 10*3/uL (ref 0.7–3.1)
Lymphs: 45 %
MCH: 29 pg (ref 26.6–33.0)
MCHC: 31.9 g/dL (ref 31.5–35.7)
MCV: 91 fL (ref 79–97)
MONOS ABS: 0.4 10*3/uL (ref 0.1–0.9)
Monocytes: 8 %
NEUTROS ABS: 2.2 10*3/uL (ref 1.4–7.0)
Neutrophils: 46 %
Platelets: 275 10*3/uL (ref 150–379)
RBC: 4.21 x10E6/uL (ref 3.77–5.28)
RDW: 14.3 % (ref 12.3–15.4)
WBC: 4.8 10*3/uL (ref 3.4–10.8)

## 2015-03-11 LAB — LIPID PANEL
Chol/HDL Ratio: 3.3 ratio units (ref 0.0–4.4)
Cholesterol, Total: 139 mg/dL (ref 100–199)
HDL: 42 mg/dL (ref >39)
LDL Calculated: 84 mg/dL (ref 0–99)
Triglycerides: 66 mg/dL (ref 0–149)
VLDL Cholesterol Cal: 13 mg/dL (ref 5–40)

## 2015-03-11 LAB — HEMOGLOBIN A1C
Est. average glucose Bld gHb Est-mCnc: 128 mg/dL
HEMOGLOBIN A1C: 6.1 % — AB (ref 4.8–5.6)

## 2015-03-11 LAB — TSH: TSH: 2.18 u[IU]/mL (ref 0.450–4.500)

## 2015-03-11 MED ORDER — LOSARTAN POTASSIUM 25 MG PO TABS: 25.0000 mg | ORAL_TABLET | Freq: Every day | ORAL | Status: DC

## 2015-03-12 ENCOUNTER — Telehealth: Payer: Self-pay

## 2015-03-12 NOTE — Telephone Encounter (Signed)
-----   Message from Glean Hess, MD sent at 03/11/2015  4:48 PM EST ----- Urine protein positive so will send in Rx for medication.  All other labs are good.  DM is well controlled.  Cholesterol is excellent.

## 2015-03-12 NOTE — Telephone Encounter (Signed)
Left message for patient to call back  

## 2015-03-12 NOTE — Telephone Encounter (Signed)
Spoke with patient. Patient advised of all results and verbalized understanding. Will call back with any future questions or concerns. MAH  

## 2015-04-07 ENCOUNTER — Ambulatory Visit: Payer: Commercial Managed Care - HMO

## 2015-04-09 ENCOUNTER — Ambulatory Visit: Payer: Commercial Managed Care - HMO

## 2015-04-09 ENCOUNTER — Ambulatory Visit
Admission: RE | Admit: 2015-04-09 | Discharge: 2015-04-09 | Disposition: A | Discharge status: Home / Self Care | Payer: Commercial Managed Care - HMO | Source: Ambulatory Visit | Attending: General Surgery | Admitting: General Surgery

## 2015-04-09 ENCOUNTER — Other Ambulatory Visit: Payer: Self-pay | Admitting: General Surgery

## 2015-04-09 DIAGNOSIS — Z1231 Encounter for screening mammogram for malignant neoplasm of breast: Secondary | ICD-10-CM

## 2015-04-15 ENCOUNTER — Ambulatory Visit: Payer: Commercial Managed Care - HMO | Admitting: General Surgery

## 2015-04-22 ENCOUNTER — Encounter: Payer: Self-pay | Admitting: General Surgery

## 2015-04-22 ENCOUNTER — Ambulatory Visit (INDEPENDENT_AMBULATORY_CARE_PROVIDER_SITE_OTHER): Payer: Commercial Managed Care - HMO | Admitting: General Surgery

## 2015-04-22 VITALS — BP 158/88 | HR 80 | Resp 14 | Ht 64.0 in | Wt 190.0 lb

## 2015-04-22 DIAGNOSIS — N6019 Diffuse cystic mastopathy of unspecified breast: Secondary | ICD-10-CM

## 2015-04-22 NOTE — Patient Instructions (Signed)
Patient will be asked to return to the office in one year with a bilateral screening mammogram. 

## 2015-04-22 NOTE — Progress Notes (Signed)
[name], female   DOB: 04-12-1940, 75 y.o.   MRN: [id number]  Chief Complaint  Patient presents with  . Follow-up    mammogram    HPI Natalie Gibbs is a 75 y.o. female who presents for a breast evaluation. The most recent mammogram was done on 04/12/15.  Patient does perform regular self breast checks and gets regular mammograms done.  No new breast issues. I have reviewed the history of present illness with the patient. HPI  Past Medical History  Diagnosis Date  . Allergy   . Glaucoma   . Arthritis   . Obesity, unspecified   . Diffuse cystic mastopathy   . Special screening for malignant neoplasms, colon   . Type II diabetes mellitus with renal manifestations ([place]) 05/15/2014    Past Surgical History  Procedure Laterality Date  . Tubal ligation    . Excision mass abdominal wall  1997  . Colonoscopy  2010    Dr. Candace Cruise, [facility]  . Colonoscopy N/A 05/13/2014    Procedure: COLONOSCOPY;  Surgeon: Hulen Luster, MD;  Location: [facility] ENDOSCOPY;  Service: Gastroenterology;  Laterality: N/A;  . Cataract extraction, bilateral Bilateral 2017  . Breast cyst aspiration Left 2015    neg  . Breast biopsy Left 20 + yrs ago    benign    Family History  Problem Relation Age of Onset  . Breast cancer Other     niece/breast  . Diabetes Sister     Social History Social History  Substance Use Topics  . Smoking status: Never Smoker   . Smokeless tobacco: Never Used  . Alcohol Use: No    Allergies  Allergen Reactions  . Iodine Itching  . Shellfish Allergy Itching  . Codeine Rash    Current Outpatient Prescriptions  Medication Sig Dispense Refill  . aspirin 81 MG tablet Take 81 mg by mouth daily.    . dorzolamide (TRUSOPT) 2 % ophthalmic solution Place 1 drop into both eyes 2 (two) times daily.     [name] losartan (COZAAR) 25 MG tablet Take 1 tablet (25 mg total) by mouth daily. 30 tablet 5  . POTASSIUM CHLORIDE ER PO Take by mouth daily.    . Travoprost,  BAK Free, (TRAVATAN) 0.004 % SOLN ophthalmic solution Place 1 drop into both eyes at bedtime.     No current facility-administered medications for this visit.    Review of Systems Review of Systems  Constitutional: Negative.   Respiratory: Negative.   Cardiovascular: Negative.     Blood pressure 158/88, pulse 80, resp. rate 14, height 5\' 4"  (1.626 m), weight 190 lb (86.183 kg).  Physical Exam Physical Exam  Constitutional: She is oriented to person, place, and time. She appears well-developed and well-nourished.  Eyes: Conjunctivae are normal. No scleral icterus.  Neck: Neck supple.  Cardiovascular: Normal rate, regular rhythm and normal heart sounds.   Pulmonary/Chest: Effort normal and breath sounds normal. Right breast exhibits no inverted nipple, no mass, no nipple discharge, no skin change and no tenderness. Left breast exhibits no inverted nipple, no mass, no nipple discharge, no skin change and no tenderness.  Abdominal: Soft. Bowel sounds are normal. There is no tenderness.  Lymphadenopathy:    She has no cervical adenopathy.    She has no axillary adenopathy.  Neurological: She is alert and oriented to person, place, and time.  Skin: Skin is warm.  Keratotic lesions thoughout the body   Data Reviewed Mammogram reviewed  Assessment  Stable exam , Fibrocystic disease.     Plan    Patient will be asked to return to the office in one year with a bilateral screening mammogram.  PCP:  Glean Hess This information has been scribed by Karie Fetch RN, BSN,BC.        SANKAR,SEEPLAPUTHUR G 04/22/2015, 4:47 PM

## 2015-05-07 ENCOUNTER — Ambulatory Visit (INDEPENDENT_AMBULATORY_CARE_PROVIDER_SITE_OTHER): Payer: Commercial Managed Care - HMO | Admitting: Internal Medicine

## 2015-05-07 ENCOUNTER — Encounter: Payer: Self-pay | Admitting: Internal Medicine

## 2015-05-07 VITALS — BP 128/80 | HR 80 | Ht 64.0 in | Wt 190.0 lb

## 2015-05-07 DIAGNOSIS — J40 Bronchitis, not specified as acute or chronic: Secondary | ICD-10-CM

## 2015-05-07 MED ORDER — CEFDINIR 300 MG PO CAPS: 300.0000 mg | ORAL_CAPSULE | Freq: Two times a day (BID) | ORAL | Status: DC

## 2015-05-07 MED ORDER — BENZONATATE 100 MG PO CAPS: 100.0000 mg | ORAL_CAPSULE | Freq: Two times a day (BID) | ORAL | Status: DC | PRN

## 2015-05-07 MED ORDER — DM-GUAIFENESIN ER 30-600 MG PO TB12: 1.0000 | ORAL_TABLET | Freq: Two times a day (BID) | ORAL | Status: DC

## 2015-05-07 NOTE — Progress Notes (Signed)
Date:  05/07/2015   Name:  Natalie Gibbs   DOB:  10-17-1940   MRN:  [id number]   Chief Complaint: Cough Cough This is a new problem. The current episode started 1 to 4 weeks ago. The problem has been unchanged. The problem occurs hourly. The cough is productive of purulent sputum. Associated symptoms include ear congestion, shortness of breath, sweats and wheezing. Pertinent negatives include no chest pain, chills, fever, headaches, heartburn, nasal congestion, postnasal drip or weight loss. She has tried OTC cough suppressant for the symptoms. The treatment provided mild relief.  She is having trouble sleeping at night due to cough.    Review of Systems  Constitutional: Positive for diaphoresis. Negative for fever, chills and weight loss.  HENT: Negative for postnasal drip.   Respiratory: Positive for cough, shortness of breath and wheezing. Negative for chest tightness.   Cardiovascular: Negative for chest pain and palpitations.  Gastrointestinal: Negative for heartburn, nausea and vomiting.  Neurological: Negative for dizziness, syncope and headaches.  Psychiatric/Behavioral: Positive for sleep disturbance.    Patient Active Problem List   Diagnosis Date Noted  . Type II diabetes mellitus with renal manifestations ([place]) 05/15/2014  . Allergic state 05/15/2014  . Fibrocystic breast 05/15/2014  . Glaucoma 05/15/2014    Prior to Admission medications   Medication Sig Start Date End Date Taking? Authorizing Provider  aspirin 81 MG tablet Take 81 mg by mouth daily.   Yes Historical Provider, MD  dorzolamide (TRUSOPT) 2 % ophthalmic solution Place 1 drop into both eyes 2 (two) times daily.    Yes Historical Provider, MD  losartan (COZAAR) 25 MG tablet Take 1 tablet (25 mg total) by mouth daily. 03/11/15  Yes Glean Hess, MD  POTASSIUM CHLORIDE ER PO Take by mouth daily.   Yes Historical Provider, MD  Travoprost, BAK Free, (TRAVATAN) 0.004 % SOLN ophthalmic solution  Place 1 drop into both eyes at bedtime.   Yes Historical Provider, MD    Allergies  Allergen Reactions  . Iodine Itching  . Shellfish Allergy Itching  . Codeine Rash    Past Surgical History  Procedure Laterality Date  . Tubal ligation    . Excision mass abdominal wall  1997  . Colonoscopy  2010    Dr. Candace Cruise, [facility]  . Colonoscopy N/A 05/13/2014    Procedure: COLONOSCOPY;  Surgeon: Hulen Luster, MD;  Location: [facility] ENDOSCOPY;  Service: Gastroenterology;  Laterality: N/A;  . Cataract extraction, bilateral Bilateral 2017  . Breast cyst aspiration Left 2015    neg  . Breast biopsy Left 20 + yrs ago    benign    Social History  Substance Use Topics  . Smoking status: Never Smoker   . Smokeless tobacco: Never Used  . Alcohol Use: No    Medication list has been reviewed and updated.   Physical Exam  Constitutional: She is oriented to person, place, and time. She appears well-developed. No distress.  HENT:  Head: Normocephalic and atraumatic.  Right Ear: Tympanic membrane and ear canal normal.  Left Ear: Tympanic membrane and ear canal normal.  Mouth/Throat: Uvula is midline and oropharynx is clear and moist.  Neck: Normal range of motion. Neck supple.  Cardiovascular: Normal rate, regular rhythm and normal heart sounds.   Pulmonary/Chest: Effort normal. No respiratory distress. She has decreased breath sounds. She has wheezes in the right upper field and the left upper field. She has no rhonchi. She has no rales.  Musculoskeletal: Normal range  of motion.  Neurological: She is alert and oriented to person, place, and time.  Skin: Skin is warm and dry. No rash noted.  Psychiatric: She has a normal mood and affect. Her behavior is normal. Thought content normal.    BP 128/80 mmHg  Pulse 80  Ht 5\' 4"  (1.626 m)  Wt 190 lb (86.183 kg)  BMI 32.60 kg/m2  Assessment and Plan: 1. Bronchitis - cefdinir (OMNICEF) 300 MG capsule; Take 1 capsule (300 mg total) by mouth 2 (two) times  daily.  Dispense: 20 capsule; Refill: 0 - dextromethorphan-guaiFENesin (MUCINEX DM) 30-600 MG 12hr tablet; Take 1 tablet by mouth 2 (two) times daily.  Dispense: 10 tablet; Refill: 0 - benzonatate (TESSALON) 100 MG capsule; Take 1 capsule (100 mg total) by mouth 2 (two) times daily as needed for cough.  Dispense: 20 capsule; Refill: 0   Halina Maidens, MD [facility]  05/07/2015

## 2015-05-07 NOTE — Patient Instructions (Addendum)
Chronic Bronchitis Chronic bronchitis is a lasting inflammation of the bronchial tubes, which are the tubes that carry air into your lungs. This is inflammation that occurs:   On most days of the week.   For at least three months at a time.   Over a period of two years in a row. When the bronchial tubes are inflamed, they start to produce mucus. The inflammation and buildup of mucus make it more difficult to breathe. Chronic bronchitis is usually a permanent problem and is one type of chronic obstructive pulmonary disease (COPD). People with chronic bronchitis are at greater risk for getting repeated colds, or respiratory infections. CAUSES  Chronic bronchitis most often occurs in people who have:  Long-standing, severe asthma.  A history of smoking.  Asthma and who also smoke. SIGNS AND SYMPTOMS  Chronic bronchitis may cause the following:   A cough that brings up mucus (productive cough).  Shortness of breath.  Early morning headache.  Wheezing.  Chest discomfort.   Recurring respiratory infections. DIAGNOSIS  Your health care provider may confirm the diagnosis by:  Taking your medical history.  Performing a physical exam.  Taking a chest X-ray.   Performing pulmonary function tests. TREATMENT  Treatment involves controlling symptoms with medicines, oxygen therapy, or making lifestyle changes, such as exercising and eating a healthy, well-balanced diet. Medicines could include:  Inhalers to improve air flow in and out of your lungs.  Antibiotics to treat bacterial infections, such as pneumonia, sinus infections, and acute bronchitis. As a preventative measure, your health care provider may recommend routine vaccinations for influenza and pneumonia. This is to prevent infection and hospitalization since you may be more at risk for these types of infections.  HOME CARE INSTRUCTIONS  Take medicines only as directed by your health care provider.   If you smoke  cigarettes, chew tobacco, or use electronic cigarettes, quit. If you need help quitting, ask your health care provider.  Avoid pollen, dust, animal dander, molds, smoke, and other things that cause shortness of breath or wheezing attacks.  Talk to your health care provider about possible exercise routines. Regular exercise is very important to help you feel better.  If you are prescribed oxygen use at home follow these guidelines:  Never smoke while using oxygen. Oxygen does not burn or explode, but flammable materials will burn faster in the presence of oxygen.  Keep a fire extinguisher close by. Let your fire department know that you have oxygen in your home.  Warn visitors not to smoke near you when you are using oxygen. Put up "no smoking" signs in your home where you most often use the oxygen.  Regularly test your smoke detectors at home to make sure they work. If you receive care in your home from a nurse or other health care provider, he or she may also check to make sure your smoke detectors work.  Ask your health care provider whether you would benefit from a pulmonary rehabilitation program.  Do not wait to get medical care if you have any concerning symptoms. Delays could cause permanent injury and may be life threatening. SEEK MEDICAL CARE IF:  You have increased coughing or shortness of breath or both.  You have muscle aches.  You have chest pain.  Your mucus gets thicker.  Your mucus changes from clear or white to yellow, green, gray, or bloody. SEEK IMMEDIATE MEDICAL CARE IF:  Your usual medicines do not stop your wheezing.   You have increased difficulty breathing.     You have any problems with the medicine you are taking, such as a rash, itching, swelling, or trouble breathing. MAKE SURE YOU:   Understand these instructions.  Will watch your condition.  Will get help right away if you are not doing well or get worse.   This information is not intended to  replace advice given to you by your health care provider. Make sure you discuss any questions you have with your health care provider.   Document Released: 10/08/2005 Document Revised: 01/11/2014 Document Reviewed: 01/29/2013 Elsevier Interactive Patient Education 2016 [employer]  

## 2015-05-20 DIAGNOSIS — M7541 Impingement syndrome of right shoulder: Secondary | ICD-10-CM | POA: Diagnosis not present

## 2015-09-10 ENCOUNTER — Encounter: Payer: Self-pay | Admitting: Internal Medicine

## 2015-09-10 ENCOUNTER — Ambulatory Visit (INDEPENDENT_AMBULATORY_CARE_PROVIDER_SITE_OTHER): Payer: Commercial Managed Care - HMO | Admitting: Internal Medicine

## 2015-09-10 VITALS — BP 132/78 | HR 68 | Resp 16 | Ht 64.0 in | Wt 188.0 lb

## 2015-09-10 DIAGNOSIS — E1121 Type 2 diabetes mellitus with diabetic nephropathy: Secondary | ICD-10-CM | POA: Diagnosis not present

## 2015-09-10 DIAGNOSIS — Z23 Encounter for immunization: Secondary | ICD-10-CM

## 2015-09-10 MED ORDER — LOSARTAN POTASSIUM 25 MG PO TABS: 25.0000 mg | ORAL_TABLET | Freq: Every day | ORAL | 12 refills | Status: DC

## 2015-09-10 NOTE — Progress Notes (Signed)
Date:  09/10/2015   Name:  Natalie Gibbs   DOB:  September 28, 1940   MRN:  [id number]   Chief Complaint: Diabetes Diabetes  She presents for her follow-up diabetic visit. She has type 2 diabetes mellitus. Her disease course has been stable. There are no hypoglycemic associated symptoms. Pertinent negatives for hypoglycemia include no headaches or tremors. Pertinent negatives for diabetes include no blurred vision, no chest pain, no fatigue, no polydipsia, no polyphagia and no polyuria. Symptoms are stable. Current diabetic treatment includes diet. Her weight is stable. Her breakfast blood glucose is taken between 6-7 am. Her breakfast blood glucose range is generally 110-130 mg/dl.     Review of Systems  Constitutional: Negative for appetite change, fatigue, fever and unexpected weight change.  HENT: Negative for tinnitus and trouble swallowing.   Eyes: Negative for blurred vision and visual disturbance.  Respiratory: Negative for cough, chest tightness and shortness of breath.   Cardiovascular: Negative for chest pain, palpitations and leg swelling.  Gastrointestinal: Negative for abdominal pain.  Endocrine: Negative for polydipsia, polyphagia and polyuria.  Genitourinary: Negative for dysuria and hematuria.  Musculoskeletal: Negative for arthralgias.  Neurological: Negative for tremors, numbness and headaches.  Psychiatric/Behavioral: Negative for dysphoric mood.    Patient Active Problem List   Diagnosis Date Noted  . Type II diabetes mellitus with renal manifestations ([place]) 05/15/2014  . Allergic state 05/15/2014  . Fibrocystic breast 05/15/2014  . Glaucoma 05/15/2014    Prior to Admission medications   Medication Sig Start Date End Date Taking? Authorizing Provider  aspirin 81 MG tablet Take 81 mg by mouth daily.   Yes Historical Provider, MD  dorzolamide (TRUSOPT) 2 % ophthalmic solution Place 1 drop into both eyes 2 (two) times daily.    Yes Historical Provider, MD    losartan (COZAAR) 25 MG tablet Take 1 tablet (25 mg total) by mouth daily. 03/11/15  Yes Glean Hess, MD  meloxicam (MOBIC) 15 MG tablet Take 15 mg by mouth daily.   Yes Historical Provider, MD  POTASSIUM CHLORIDE ER PO Take by mouth daily.   Yes Historical Provider, MD  Travoprost, BAK Free, (TRAVATAN) 0.004 % SOLN ophthalmic solution Place 1 drop into both eyes at bedtime.   Yes Historical Provider, MD    Allergies  Allergen Reactions  . Iodine Itching  . Shellfish Allergy Itching  . Codeine Rash    Past Surgical History:  Procedure Laterality Date  . BREAST BIOPSY Left 20 + yrs ago   benign  . BREAST CYST ASPIRATION Left 2015   neg  . CATARACT EXTRACTION, BILATERAL Bilateral 2017  . COLONOSCOPY  2010   Dr. Candace Cruise, [facility]  . COLONOSCOPY N/A 05/13/2014   Procedure: COLONOSCOPY;  Surgeon: Hulen Luster, MD;  Location: [facility] ENDOSCOPY;  Service: Gastroenterology;  Laterality: N/A;  . excision mass abdominal wall  1997  . TUBAL LIGATION      Social History  Substance Use Topics  . Smoking status: Never Smoker  . Smokeless tobacco: Never Used  . Alcohol use No     Medication list has been reviewed and updated.   Physical Exam  Constitutional: She is oriented to person, place, and time. She appears well-developed. No distress.  HENT:  Head: Normocephalic and atraumatic.  Neck: Normal range of motion. Neck supple. Carotid bruit is not present.  Cardiovascular: Normal rate, regular rhythm and normal heart sounds.   Pulmonary/Chest: Effort normal and breath sounds normal. No respiratory distress. She has no wheezes.  Musculoskeletal: Normal range of motion. She exhibits no edema or tenderness.  Neurological: She is alert and oriented to person, place, and time.  Skin: Skin is warm and dry. No rash noted.  Psychiatric: She has a normal mood and affect. Her speech is normal and behavior is normal. Thought content normal.  Nursing note and vitals reviewed.   BP 132/78 (BP Location:  Right Arm, Patient Position: Sitting, Cuff Size: Normal)   Pulse 68   Resp 16   Ht 5\' 4"  (1.626 m)   Wt 188 lb (85.3 kg)   SpO2 97%   BMI 32.27 kg/m   Assessment and Plan: 1. Type 2 diabetes mellitus with diabetic nephropathy, without long-term current use of insulin (HCC) Continue diet, losartan - Hemoglobin A1c - losartan (COZAAR) 25 MG tablet; Take 1 tablet (25 mg total) by mouth daily.  Dispense: 30 tablet; Refill: 12  2. Need for influenza vaccination - Flu Vaccine QUAD 36+ mos IM   Halina Maidens, MD [facility]  09/10/2015

## 2015-09-11 LAB — HEMOGLOBIN A1C
ESTIMATED AVERAGE GLUCOSE: 123 mg/dL
HEMOGLOBIN A1C: 5.9 % — AB (ref 4.8–5.6)

## 2015-09-24 DIAGNOSIS — H402222 Chronic angle-closure glaucoma, left eye, moderate stage: Secondary | ICD-10-CM | POA: Diagnosis not present

## 2016-01-15 ENCOUNTER — Telehealth: Payer: Self-pay

## 2016-01-15 NOTE — Telephone Encounter (Signed)
Called for [employer] Ref to [facility] but when I called them they said No Ref Needed. [facility]

## 2016-02-11 DIAGNOSIS — H402222 Chronic angle-closure glaucoma, left eye, moderate stage: Secondary | ICD-10-CM | POA: Diagnosis not present

## 2016-02-11 DIAGNOSIS — H402211 Chronic angle-closure glaucoma, right eye, mild stage: Secondary | ICD-10-CM | POA: Diagnosis not present

## 2016-02-20 ENCOUNTER — Other Ambulatory Visit: Payer: Self-pay

## 2016-02-20 DIAGNOSIS — Z1231 Encounter for screening mammogram for malignant neoplasm of breast: Secondary | ICD-10-CM

## 2016-03-10 ENCOUNTER — Encounter: Payer: Commercial Managed Care - HMO | Admitting: Internal Medicine

## 2016-03-24 DIAGNOSIS — H402231 Chronic angle-closure glaucoma, bilateral, mild stage: Secondary | ICD-10-CM | POA: Diagnosis not present

## 2016-03-31 ENCOUNTER — Encounter: Payer: Self-pay | Admitting: Internal Medicine

## 2016-03-31 ENCOUNTER — Ambulatory Visit (INDEPENDENT_AMBULATORY_CARE_PROVIDER_SITE_OTHER): Payer: Medicare HMO | Admitting: Internal Medicine

## 2016-03-31 VITALS — BP 116/62 | HR 66 | Ht 64.0 in | Wt 187.0 lb

## 2016-03-31 DIAGNOSIS — R809 Proteinuria, unspecified: Secondary | ICD-10-CM | POA: Diagnosis not present

## 2016-03-31 DIAGNOSIS — R7989 Other specified abnormal findings of blood chemistry: Secondary | ICD-10-CM | POA: Diagnosis not present

## 2016-03-31 DIAGNOSIS — H409 Unspecified glaucoma: Secondary | ICD-10-CM | POA: Diagnosis not present

## 2016-03-31 DIAGNOSIS — E1129 Type 2 diabetes mellitus with other diabetic kidney complication: Secondary | ICD-10-CM | POA: Diagnosis not present

## 2016-03-31 DIAGNOSIS — Z0000 Encounter for general adult medical examination without abnormal findings: Secondary | ICD-10-CM

## 2016-03-31 LAB — POCT URINALYSIS DIPSTICK
Bilirubin, UA: NEGATIVE
Blood, UA: NEGATIVE
GLUCOSE UA: NEGATIVE
Ketones, UA: NEGATIVE
Leukocytes, UA: NEGATIVE
NITRITE UA: NEGATIVE
PROTEIN UA: NEGATIVE
Spec Grav, UA: 1.01 (ref 1.030–1.035)
UROBILINOGEN UA: 0.2 (ref ?–2.0)
pH, UA: 6 (ref 5.0–8.0)

## 2016-03-31 NOTE — Patient Instructions (Signed)
Health Maintenance  Topic Date Due  . FOOT EXAM  03/09/2016  . HEMOGLOBIN A1C  03/09/2016  . MAMMOGRAM  04/08/2016  . OPHTHALMOLOGY EXAM  03/24/2017  . COLONOSCOPY  05/14/2017  . TETANUS/TDAP  03/09/2025  . INFLUENZA VACCINE  Completed  . DEXA SCAN  Completed  . PNA vac Low Risk Adult  Completed    Breast Self-Awareness Breast self-awareness means being familiar with how your breasts look and feel. It involves checking your breasts regularly and reporting any changes to your health care provider. Practicing breast self-awareness is important. A change in your breasts can be a sign of a serious medical problem. Being familiar with how your breasts look and feel allows you to find any problems early, when treatment is more likely to be successful. All women should practice breast self-awareness, including women who have had breast implants. How to do a breast self-exam One way to learn what is normal for your breasts and whether your breasts are changing is to do a breast self-exam. To do a breast self-exam: Look for Changes   1. Remove all the clothing above your waist. 2. Stand in front of a mirror in a room with good lighting. 3. Put your hands on your hips. 4. Push your hands firmly downward. 5. Compare your breasts in the mirror. Look for differences between them (asymmetry), such as:  Differences in shape.  Differences in size.  Puckers, dips, and bumps in one breast and not the other. 6. Look at each breast for changes in your skin, such as:  Redness.  Scaly areas. 7. Look for changes in your nipples, such as:  Discharge.  Bleeding.  Dimpling.  Redness.  A change in position. Feel for Changes   Carefully feel your breasts for lumps and changes. It is best to do this while lying on your back on the floor and again while sitting or standing in the shower or tub with soapy water on your skin. Feel each breast in the following way:  Place the arm on the side of the  breast you are examining above your head.  Feel your breast with the other hand.  Start in the nipple area and make  inch (2 cm) overlapping circles to feel your breast. Use the pads of your three middle fingers to do this. Apply light pressure, then medium pressure, then firm pressure. The light pressure will allow you to feel the tissue closest to the skin. The medium pressure will allow you to feel the tissue that is a little deeper. The firm pressure will allow you to feel the tissue close to the ribs.  Continue the overlapping circles, moving downward over the breast until you feel your ribs below your breast.  Move one finger-width toward the center of the body. Continue to use the  inch (2 cm) overlapping circles to feel your breast as you move slowly up toward your collarbone.  Continue the up and down exam using all three pressures until you reach your armpit. Write Down What You Find   Write down what is normal for each breast and any changes that you find. Keep a written record with breast changes or normal findings for each breast. By writing this information down, you do not need to depend only on memory for size, tenderness, or location. Write down where you are in your menstrual cycle, if you are still menstruating. If you are having trouble noticing differences in your breasts, do not get discouraged. With time  you will become more familiar with the variations in your breasts and more comfortable with the exam. How often should I examine my breasts? Examine your breasts every month. If you are breastfeeding, the best time to examine your breasts is after a feeding or after using a breast pump. If you menstruate, the best time to examine your breasts is 5-7 days after your period is over. During your period, your breasts are lumpier, and it may be more difficult to notice changes. When should I see my health care provider? See your health care provider if you notice:  A change in  shape or size of your breasts or nipples.  A change in the skin of your breast or nipples, such as a reddened or scaly area.  Unusual discharge from your nipples.  A lump or thick area that was not there before.  Pain in your breasts.  Anything that concerns you. This information is not intended to replace advice given to you by your health care provider. Make sure you discuss any questions you have with your health care provider. Document Released: 12/21/2004 Document Revised: 05/29/2015 Document Reviewed: 11/10/2014 Elsevier Interactive Patient Education  2017 [employer].

## 2016-03-31 NOTE — Progress Notes (Signed)
Patient: Natalie Gibbs, Female    DOB: 06/18/1940, 76 y.o.   MRN: [id number] Visit Date: 03/31/2016  Today's Provider: Halina Maidens, MD   Chief Complaint  Patient presents with  . Medicare Wellness    breast exam.   Subjective:    Annual wellness visit Natalie Gibbs is a 76 y.o. female who presents today for her Subsequent Annual Wellness Visit. She feels fairly well. She reports exercising none. She reports she is sleeping fairly well. No breast problems.  She is due for mammogram in April.  ----------------------------------------------------------- Diabetes  She presents for her follow-up diabetic visit. She has type 2 diabetes mellitus. Her disease course has been stable. Pertinent negatives for hypoglycemia include no dizziness, headaches, nervousness/anxiousness or tremors. Pertinent negatives for diabetes include no chest pain, no fatigue, no polydipsia and no polyuria. Diabetic complications include nephropathy. Current diabetic treatment includes diet. She is compliant with treatment most of the time. She monitors blood glucose at home 1-2 x per day. Her breakfast blood glucose is taken between 7-8 am. Her breakfast blood glucose range is generally 90-110 mg/dl. An ACE inhibitor/angiotensin II receptor blocker is being taken.    Review of Systems  Constitutional: Negative for chills, fatigue and fever.  HENT: Negative for congestion, hearing loss, tinnitus, trouble swallowing and voice change.   Eyes: Negative for visual disturbance.  Respiratory: Negative for cough, chest tightness, shortness of breath and wheezing.   Cardiovascular: Negative for chest pain, palpitations and leg swelling.  Gastrointestinal: Negative for abdominal pain, constipation, diarrhea and vomiting.  Endocrine: Negative for polydipsia and polyuria.  Genitourinary: Negative for dysuria, frequency, genital sores, vaginal bleeding and vaginal discharge.  Musculoskeletal: Positive for  back pain. Negative for arthralgias, gait problem and joint swelling.  Skin: Negative for color change and rash.  Neurological: Negative for dizziness, tremors, light-headedness and headaches.  Hematological: Negative for adenopathy. Does not bruise/bleed easily.  Psychiatric/Behavioral: Positive for sleep disturbance. Negative for dysphoric mood. The patient is not nervous/anxious.     Social History   Social History  . Marital status: Married    Spouse name: N/A  . Number of children: N/A  . Years of education: N/A   Occupational History  . Not on file.   Social History Main Topics  . Smoking status: Never Smoker  . Smokeless tobacco: Never Used  . Alcohol use No  . Drug use: No  . Sexual activity: Not on file   Other Topics Concern  . Not on file   Social History Narrative  . No narrative on file    Patient Active Problem List   Diagnosis Date Noted  . Type II diabetes mellitus with renal manifestations ([place]) 05/15/2014  . Allergic state 05/15/2014  . Fibrocystic breast 05/15/2014  . Glaucoma 05/15/2014    Past Surgical History:  Procedure Laterality Date  . BREAST BIOPSY Left 20 + yrs ago   benign  . BREAST CYST ASPIRATION Left 2015   neg  . CATARACT EXTRACTION, BILATERAL Bilateral 2017  . COLONOSCOPY  2010   Dr. Candace Cruise, [facility]  . COLONOSCOPY N/A 05/13/2014   Procedure: COLONOSCOPY;  Surgeon: Hulen Luster, MD;  Location: [facility] ENDOSCOPY;  Service: Gastroenterology;  Laterality: N/A;  . excision mass abdominal wall  1997  . TUBAL LIGATION      Her family history includes Breast cancer in her other; Diabetes in her sister.     Previous Medications   ASPIRIN 81 MG TABLET    Take 81  mg by mouth daily.   BRIMONIDINE (ALPHAGAN) 0.2 % OPHTHALMIC SOLUTION    3 (three) times daily.   DORZOLAMIDE (TRUSOPT) 2 % OPHTHALMIC SOLUTION    Place 1 drop into both eyes 2 (two) times daily.    LATANOPROST (XALATAN) 0.005 % OPHTHALMIC SOLUTION    1 drop at bedtime.   LOSARTAN  (COZAAR) 25 MG TABLET    Take 1 tablet (25 mg total) by mouth daily.   MELOXICAM (MOBIC) 15 MG TABLET    Take 15 mg by mouth daily.   POTASSIUM CHLORIDE ER PO    Take by mouth daily.    Patient Care Team: Glean Hess, MD as PCP - General (Family Medicine) Christene Lye, MD (General Surgery) Glean Hess, MD (Family Medicine)      Objective:   Vitals: BP 116/62 (BP Location: Right Arm, Patient Position: Sitting, Cuff Size: Normal)   Pulse 66   Ht 5\' 4"  (1.626 m)   Wt 187 lb (84.8 kg)   SpO2 97%   BMI 32.10 kg/m   Physical Exam  Constitutional: She is oriented to person, place, and time. She appears well-developed and well-nourished. No distress.  HENT:  Head: Normocephalic and atraumatic.  Right Ear: Tympanic membrane and ear canal normal.  Left Ear: Tympanic membrane and ear canal normal.  Nose: Right sinus exhibits no maxillary sinus tenderness. Left sinus exhibits no maxillary sinus tenderness.  Mouth/Throat: Uvula is midline and oropharynx is clear and moist.  Eyes: Conjunctivae and EOM are normal. Right eye exhibits no discharge. Left eye exhibits no discharge. No scleral icterus.  Neck: Normal range of motion. Carotid bruit is not present. No erythema present. No thyromegaly present.  Cardiovascular: Normal rate, regular rhythm, normal heart sounds and normal pulses.   Pulmonary/Chest: Effort normal. No respiratory distress. She has no wheezes. Right breast exhibits no mass, no nipple discharge, no skin change and no tenderness. Left breast exhibits no mass, no nipple discharge, no skin change and no tenderness.  Abdominal: Soft. Bowel sounds are normal. There is no hepatosplenomegaly. There is no tenderness. There is no CVA tenderness.  Musculoskeletal: Normal range of motion.  Lymphadenopathy:    She has no cervical adenopathy.    She has no axillary adenopathy.  Neurological: She is alert and oriented to person, place, and time. She has normal reflexes.  No cranial nerve deficit or sensory deficit.  Skin: Skin is warm, dry and intact. No rash noted.  Multiple nevi and skin tage  Psychiatric: She has a normal mood and affect. Her speech is normal and behavior is normal. Thought content normal.  Nursing note and vitals reviewed.   Activities of Daily Living In your present state of health, do you have any difficulty performing the following activities: 03/31/2016  Hearing? N  Vision? N  Difficulty concentrating or making decisions? N  Walking or climbing stairs? N  Dressing or bathing? N  Doing errands, shopping? N  Preparing Food and eating ? N  Using the Toilet? N  In the past six months, have you accidently leaked urine? N  Do you have problems with loss of bowel control? N  Managing your Medications? N  Managing your Finances? N  Housekeeping or managing your Housekeeping? N  Some recent data might be hidden    Fall Risk Assessment Fall Risk  03/31/2016 02/04/2015  Falls in the past year? No No    Depression Screen PHQ 2/9 Scores 03/31/2016 02/04/2015  PHQ - 2 Score 0 0  6CIT Screen 03/31/2016  What Year? 0 points  What month? 0 points  What time? 0 points  Count back from 20 0 points  Months in reverse 0 points  Repeat phrase 2 points  Total Score 2    Medicare Annual Wellness Visit Summary:  Reviewed patient's Family Medical History Reviewed and updated list of patient's medical providers Assessment of cognitive impairment was done Assessed patient's functional ability Established a written schedule for health screening [place] Completed and Reviewed  Exercise Activities and Dietary recommendations Goals    . Increase physical activity       Immunization History  Administered Date(s) Administered  . Influenza,inj,Quad PF,36+ Mos 09/10/2015  . Pneumococcal Conjugate-13 03/06/2014  . Pneumococcal Polysaccharide-23 03/06/2008  . Tdap 03/10/2015    Health Maintenance  Topic Date  Due  . OPHTHALMOLOGY EXAM  01/19/2016  . FOOT EXAM  03/09/2016  . HEMOGLOBIN A1C  03/09/2016  . MAMMOGRAM  04/08/2016  . COLONOSCOPY  05/14/2017  . TETANUS/TDAP  03/09/2025  . INFLUENZA VACCINE  Completed  . DEXA SCAN  Completed  . PNA vac Low Risk Adult  Completed    Discussed health benefits of physical activity, and encouraged her to engage in regular exercise appropriate for her age and condition.    ------------------------------------------------------------------------------------------------------------  Assessment & Plan:  1. Medicare annual wellness visit, subsequent Measures satisfied - POCT urinalysis dipstick  2. Type 2 diabetes mellitus with microalbuminuria, without long-term current use of insulin (HCC) Continue diet, ARB Consider statin  - CBC with Differential/Platelet - Lipid panel - Comprehensive metabolic panel - TSH  3. Glaucoma of both eyes, unspecified glaucoma type Followed closely by Opthalmology   No orders of the defined types were placed in this encounter.   Halina Maidens, MD [facility]  03/31/2016

## 2016-04-01 LAB — COMPREHENSIVE METABOLIC PANEL
ALBUMIN: 4.1 g/dL (ref 3.5–4.8)
ALT: 19 IU/L (ref 0–32)
AST: 14 IU/L (ref 0–40)
Albumin/Globulin Ratio: 1.2 (ref 1.2–2.2)
Alkaline Phosphatase: 94 IU/L (ref 39–117)
BILIRUBIN TOTAL: 0.4 mg/dL (ref 0.0–1.2)
BUN / CREAT RATIO: 15 (ref 12–28)
BUN: 10 mg/dL (ref 8–27)
CALCIUM: 9.3 mg/dL (ref 8.7–10.3)
CO2: 26 mmol/L (ref 18–29)
Chloride: 102 mmol/L (ref 96–106)
Creatinine, Ser: 0.68 mg/dL (ref 0.57–1.00)
GFR, EST AFRICAN AMERICAN: 100 mL/min/{1.73_m2} (ref >59)
GFR, EST NON AFRICAN AMERICAN: 86 mL/min/{1.73_m2} (ref >59)
GLOBULIN, TOTAL: 3.4 g/dL (ref 1.5–4.5)
Glucose: 103 mg/dL — ABNORMAL HIGH (ref 65–99)
POTASSIUM: 4.7 mmol/L (ref 3.5–5.2)
SODIUM: 142 mmol/L (ref 134–144)
TOTAL PROTEIN: 7.5 g/dL (ref 6.0–8.5)

## 2016-04-01 LAB — CBC WITH DIFFERENTIAL/PLATELET
BASOS ABS: 0 10*3/uL (ref 0.0–0.2)
Basos: 0 %
EOS (ABSOLUTE): 0.1 10*3/uL (ref 0.0–0.4)
EOS: 2 %
HEMATOCRIT: 39.6 % (ref 34.0–46.6)
Hemoglobin: 12.4 g/dL (ref 11.1–15.9)
IMMATURE GRANULOCYTES: 0 %
Immature Grans (Abs): 0 10*3/uL (ref 0.0–0.1)
Lymphocytes Absolute: 1.9 10*3/uL (ref 0.7–3.1)
Lymphs: 43 %
MCH: 29 pg (ref 26.6–33.0)
MCHC: 31.3 g/dL — AB (ref 31.5–35.7)
MCV: 93 fL (ref 79–97)
MONOS ABS: 0.4 10*3/uL (ref 0.1–0.9)
Monocytes: 8 %
NEUTROS PCT: 47 %
Neutrophils Absolute: 2.1 10*3/uL (ref 1.4–7.0)
PLATELETS: 270 10*3/uL (ref 150–379)
RBC: 4.28 x10E6/uL (ref 3.77–5.28)
RDW: 13.7 % (ref 12.3–15.4)
WBC: 4.5 10*3/uL (ref 3.4–10.8)

## 2016-04-01 LAB — LIPID PANEL
CHOL/HDL RATIO: 3.2 ratio (ref 0.0–4.4)
CHOLESTEROL TOTAL: 126 mg/dL (ref 100–199)
HDL: 39 mg/dL — AB (ref >39)
LDL Calculated: 73 mg/dL (ref 0–99)
TRIGLYCERIDES: 69 mg/dL (ref 0–149)
VLDL Cholesterol Cal: 14 mg/dL (ref 5–40)

## 2016-04-01 LAB — TSH: TSH: 2.24 u[IU]/mL (ref 0.450–4.500)

## 2016-04-08 LAB — HGB A1C W/O EAG: Hgb A1c MFr Bld: 6.1 % — ABNORMAL HIGH (ref 4.8–5.6)

## 2016-04-08 LAB — SPECIMEN STATUS REPORT

## 2016-04-09 ENCOUNTER — Ambulatory Visit
Admission: RE | Admit: 2016-04-09 | Discharge: 2016-04-09 | Disposition: A | Discharge status: Home / Self Care | Payer: Commercial Managed Care - HMO | Source: Ambulatory Visit | Attending: General Surgery | Admitting: General Surgery

## 2016-04-09 DIAGNOSIS — Z1231 Encounter for screening mammogram for malignant neoplasm of breast: Secondary | ICD-10-CM

## 2016-04-15 ENCOUNTER — Ambulatory Visit: Payer: Commercial Managed Care - HMO | Admitting: General Surgery

## 2016-04-19 ENCOUNTER — Encounter: Payer: Self-pay | Admitting: General Surgery

## 2016-04-19 ENCOUNTER — Ambulatory Visit (INDEPENDENT_AMBULATORY_CARE_PROVIDER_SITE_OTHER): Payer: Medicare HMO | Admitting: General Surgery

## 2016-04-19 VITALS — BP 130/78 | HR 74 | Resp 12 | Ht 62.0 in | Wt 189.0 lb

## 2016-04-19 DIAGNOSIS — N6019 Diffuse cystic mastopathy of unspecified breast: Secondary | ICD-10-CM | POA: Diagnosis not present

## 2016-04-19 NOTE — Progress Notes (Signed)
[name], female   DOB: 13-Feb-1940, 76 y.o.   MRN: [id number]  Chief Complaint  Patient presents with  . Follow-up    HPI Natalie Gibbs is a 76 y.o. female who presents for a breast evaluation. The most recent mammogram was done on 04/09/2016. Patient does perform regular self breast checks and gets regular mammograms done.  No new breast problems.  HPI  Past Medical History:  Diagnosis Date  . Allergy   . Arthritis   . Diffuse cystic mastopathy   . Glaucoma   . Obesity, unspecified   . Special screening for malignant neoplasms, colon   . Type II diabetes mellitus with renal manifestations ([place]) 05/15/2014    Past Surgical History:  Procedure Laterality Date  . BREAST BIOPSY Left 20 + yrs ago   benign  . BREAST CYST ASPIRATION Left 2015   neg  . CATARACT EXTRACTION, BILATERAL Bilateral 2017  . COLONOSCOPY  2010   Dr. Candace Cruise, [facility]  . COLONOSCOPY N/A 05/13/2014   Procedure: COLONOSCOPY;  Surgeon: Hulen Luster, MD;  Location[facility] ENDOSCOPY;  Service: Gastroenterology;  Laterality: N/A;  . excision mass abdominal wall  1997  . TUBAL LIGATION      Family History  Problem Relation Age of Onset  . Breast cancer Other     niece/breast  . Diabetes Sister     Social History Social History  Substance Use Topics  . Smoking status: Never Smoker  . Smokeless tobacco: Never Used  . Alcohol use No    Allergies  Allergen Reactions  . Iodine Itching  . Shellfish Allergy Itching  . Codeine Rash    Current Outpatient Prescriptions  Medication Sig Dispense Refill  . aspirin 81 MG tablet Take 81 mg by mouth daily.    . brimonidine (ALPHAGAN) 0.2 % ophthalmic solution 3 (three) times daily.    . dorzolamide (TRUSOPT) 2 % ophthalmic solution Place 1 drop into both eyes 2 (two) times daily.     [name] latanoprost (XALATAN) 0.005 % ophthalmic solution 1 drop at bedtime.    [name] losartan (COZAAR) 25 MG tablet Take 1 tablet (25 mg total) by mouth daily. 30  tablet 12  . POTASSIUM CHLORIDE ER PO Take by mouth daily.     No current facility-administered medications for this visit.     Review of Systems Review of Systems  Constitutional: Negative.   Respiratory: Negative.   Cardiovascular: Negative.     Blood pressure 130/78, pulse 74, resp. rate 12, height 5\' 2"  (1.575 m), weight 189 lb (85.7 kg).  Physical Exam Physical Exam  Constitutional: She is oriented to person, place, and time. She appears well-developed and well-nourished.  Eyes: Conjunctivae are normal. No scleral icterus.  Neck: Neck supple.  Cardiovascular: Normal rate, regular rhythm and normal heart sounds.   Pulmonary/Chest: Effort normal and breath sounds normal. Right breast exhibits no inverted nipple, no mass, no nipple discharge, no skin change and no tenderness. Left breast exhibits no inverted nipple, no mass, no nipple discharge, no skin change and no tenderness.  Abdominal: Soft. Bowel sounds are normal. There is no tenderness.  Lymphadenopathy:    She has no cervical adenopathy.    She has no axillary adenopathy.  Neurological: She is alert and oriented to person, place, and time.  Skin: Skin is warm and dry.  Multiple keratotic lesions over body, particularly chest and back. Appear benign.   Data Reviewed Mammogram reviewed and stable.   Assessment  Stable exam, Fibrocystic disease.  Patient is seeing a dermatologist for keratotic lesions.     Plan     Patient to return to her PCP for screening mammogram and breast checks.  Encouraged her to continue close monitoring of keratotic lesions with dermatologist.     HPI, Physical Exam, Assessment and Plan have been scribed under the direction and in the presence of Mckinley Jewel, MD  Gaspar Cola, CMA    I have completed the exam and reviewed the above documentation for accuracy and completeness.  I agree with the above.  Haematologist has been used and any errors in dictation or transcription  are unintentional.  Seeplaputhur G. Jamal Collin, M.D., F.A.C.S.  Junie Panning G 04/19/2016, 11:45 AM

## 2016-04-19 NOTE — Patient Instructions (Addendum)
    Patient to return to her PCP for screening mammogram and breast checks.  Encouraged her to continue close monitoring of keratotic lesions with dermatologist.

## 2016-04-28 HISTORY — PX: COLONOSCOPY: SHX174

## 2016-05-07 ENCOUNTER — Encounter: Payer: Self-pay | Admitting: Internal Medicine

## 2016-05-07 ENCOUNTER — Ambulatory Visit (INDEPENDENT_AMBULATORY_CARE_PROVIDER_SITE_OTHER): Payer: Medicare HMO | Admitting: Internal Medicine

## 2016-05-07 VITALS — BP 120/80 | HR 68 | Ht 64.0 in | Wt 188.0 lb

## 2016-05-07 DIAGNOSIS — R350 Frequency of micturition: Secondary | ICD-10-CM

## 2016-05-07 DIAGNOSIS — N3000 Acute cystitis without hematuria: Secondary | ICD-10-CM | POA: Diagnosis not present

## 2016-05-07 LAB — POCT URINALYSIS DIPSTICK
Bilirubin, UA: NEGATIVE
GLUCOSE UA: NEGATIVE
Ketones, UA: NEGATIVE
Nitrite, UA: NEGATIVE
SPEC GRAV UA: 1.01 (ref 1.010–1.025)
Urobilinogen, UA: 1 E.U./dL
pH, UA: 6.5 (ref 5.0–8.0)

## 2016-05-07 MED ORDER — CIPROFLOXACIN HCL 250 MG PO TABS: 250.0000 mg | ORAL_TABLET | Freq: Two times a day (BID) | ORAL | 0 refills | Status: DC

## 2016-05-07 NOTE — Progress Notes (Signed)
Date:  05/07/2016   Name:  Natalie Gibbs   DOB:  [birth year]   MRN:  [id number]   Chief Complaint: Urinary Tract Infection (burning/ stinging "a little bit" when urinating. Has had an infection before- this time has been occurring x 3 days) Urinary Tract Infection   This is a new problem. The current episode started in the past 7 days. The problem occurs every urination. The problem has been unchanged. The quality of the pain is described as burning. The pain is mild. There has been no fever. Associated symptoms include frequency and urgency. Pertinent negatives include no chills, hematuria or nausea. She has tried nothing for the symptoms.      Review of Systems  Constitutional: Negative for chills and fever.  Respiratory: Negative for shortness of breath.   Cardiovascular: Negative for chest pain.  Gastrointestinal: Negative for nausea.  Genitourinary: Positive for dysuria, frequency and urgency. Negative for hematuria.  Musculoskeletal: Negative for back pain.  Neurological: Negative for weakness.    Patient Active Problem List   Diagnosis Date Noted  . Type 2 diabetes mellitus with microalbuminuria, without long-term current use of insulin ([place]) 05/15/2014  . Allergic state 05/15/2014  . Fibrocystic breast 05/15/2014  . Glaucoma 05/15/2014    Prior to Admission medications   Medication Sig Start Date End Date Taking? Authorizing Provider  aspirin 81 MG tablet Take 81 mg by mouth daily.   Yes Historical Provider, MD  brimonidine (ALPHAGAN) 0.2 % ophthalmic solution 3 (three) times daily.   Yes Historical Provider, MD  dorzolamide (TRUSOPT) 2 % ophthalmic solution Place 1 drop into both eyes 2 (two) times daily.    Yes Historical Provider, MD  latanoprost (XALATAN) 0.005 % ophthalmic solution 1 drop at bedtime.   Yes Historical Provider, MD  losartan (COZAAR) 25 MG tablet Take 1 tablet (25 mg total) by mouth daily. 09/10/15  Yes Glean Hess, MD  POTASSIUM CHLORIDE  ER PO Take by mouth daily.   Yes Historical Provider, MD    Allergies  Allergen Reactions  . Iodine Itching  . Shellfish Allergy Itching  . Codeine Rash    Past Surgical History:  Procedure Laterality Date  . BREAST BIOPSY Left 20 + yrs ago   benign  . BREAST CYST ASPIRATION Left 2015   neg  . CATARACT EXTRACTION, BILATERAL Bilateral 2017  . COLONOSCOPY  2010   Dr. Candace Cruise, [facility]  . COLONOSCOPY N/A 05/13/2014   Procedure: COLONOSCOPY;  Surgeon: Hulen Luster, MD;  Location: [facility] ENDOSCOPY;  Service: Gastroenterology;  Laterality: N/A;  . excision mass abdominal wall  1997  . TUBAL LIGATION      Social History  Substance Use Topics  . Smoking status: Never Smoker  . Smokeless tobacco: Never Used  . Alcohol use No     Medication list has been reviewed and updated.   Physical Exam  Constitutional: She is oriented to person, place, and time. She appears well-developed. No distress.  HENT:  Head: Normocephalic and atraumatic.  Cardiovascular: Normal rate, regular rhythm and normal heart sounds.   Pulmonary/Chest: Effort normal and breath sounds normal. No respiratory distress.  Abdominal: Normal appearance. There is no tenderness. There is no CVA tenderness.  Musculoskeletal: Normal range of motion.  Neurological: She is alert and oriented to person, place, and time.  Skin: Skin is warm and dry. No rash noted.  Psychiatric: She has a normal mood and affect. Her behavior is normal. Thought content normal.  Nursing note  and vitals reviewed.   BP 120/80   Pulse 68   Ht 5\' 4"  (1.626 m)   Wt 188 lb (85.3 kg)   BMI 32.27 kg/m   Assessment and Plan: 1. Frequent urination Continue fluids - POCT Urinalysis Dipstick  2. Acute cystitis without hematuria Cipro x 3 days   Meds ordered this encounter  Medications  . ciprofloxacin (CIPRO) 250 MG tablet    Sig: Take 1 tablet (250 mg total) by mouth 2 (two) times daily.    Dispense:  6 tablet    Refill:  0    Halina Maidens,  MD [facility]  05/07/2016

## 2016-05-07 NOTE — Patient Instructions (Signed)
Urinary Frequency, Adult Urinary frequency means urinating more often than usual. People with urinary frequency urinate at least 8 times in 24 hours, even if they drink a normal amount of fluid. Although they urinate more often than normal, the total amount of urine produced in a day may be normal. Urinary frequency is also called pollakiuria. What are the causes? This condition may be caused by:  A urinary tract infection.  Obesity.  Bladder problems, such as bladder stones.  Caffeine or alcohol.  Eating food or drinking fluids that irritate the bladder. These include coffee, tea, soda, artificial sweeteners, citrus, tomato-based foods, and chocolate.  Certain medicines, such as medicines that help the body get rid of extra fluid (diuretics).  Muscle or nerve weakness.  Overactive bladder.  Chronic diabetes.  Interstitial cystitis.  In men, problems with the prostate, such as an enlarged prostate.  In women, pregnancy. In some cases, the cause may not be known. What increases the risk? This condition is more likely to develop in:  Women who have gone through menopause.  Men with prostate problems.  People with a disease or injury that affects the nerves or spinal cord.  People who have or have had a condition that affects the brain, such as a stroke. What are the signs or symptoms? Symptoms of this condition include:  Feeling an urgent need to urinate often. The stress and anxiety of needing to find a bathroom quickly can make this urge worse.  Urinating 8 or more times in 24 hours.  Urinating as often as every 1 to 2 hours. How is this diagnosed? This condition is diagnosed based on your symptoms, your medical history, and a physical exam. You may have tests, such as:  Blood tests.  Urine tests.  Imaging tests, such as X-rays or ultrasounds.  A bladder test.  A test of your neurological system. This is the body system that senses the need to urinate.  A  test to check for problems in the urethra and bladder called cystoscopy. You may also be asked to keep a bladder diary. A bladder diary is a record of what you eat and drink, how often you urinate, and how much you urinate. You may need to see a health care provider who specializes in conditions of the urinary tract (urologist) or kidneys (nephrologist). How is this treated? Treatment for this condition depends on the cause. Sometimes the condition goes away on its own and treatment is not necessary. If treatment is needed, it may include:  Taking medicine.  Learning exercises that strengthen the muscles that help control urination.  Following a bladder training program. This may include:  Learning to delay going to the bathroom.  Double urinating (voiding). This helps if you are not completely emptying your bladder.  Scheduled voiding.  Making diet changes, such as:  Avoiding caffeine.  Drinking fewer fluids, especially alcohol.  Not drinking in the evening.  Not having foods or drinks that may irritate the bladder.  Eating foods that help prevent or ease constipation. Constipation can make this condition worse.  Having the nerves in your bladder stimulated. There are two options for stimulating the nerves to your bladder:  Outpatient electrical nerve stimulation. This is done by your health care provider.  Surgery to implant a bladder pacemaker. The pacemaker helps to control the urge to urinate. Follow these instructions at home:  Keep a bladder diary if told to by your health care provider.  Take over-the-counter and prescription medicines only as   told by your health care provider.  Do any exercises as told by your health care provider.  Follow a bladder training program as told by your health care provider.  Make any recommended diet changes.  Keep all follow-up visits as told by your health care provider. This is important. Contact a health care provider  if:  You start urinating more often.  You feel pain or irritation when you urinate.  You notice blood in your urine.  Your urine looks cloudy.  You develop a fever.  You begin vomiting. Get help right away if:  You are unable to urinate. This information is not intended to replace advice given to you by your health care provider. Make sure you discuss any questions you have with your health care provider. Document Released: 10/17/2008 Document Revised: 01/22/2015 Document Reviewed: 07/17/2014 Elsevier Interactive Patient Education  2017 Elsevier Inc.  

## 2016-05-12 DIAGNOSIS — H402231 Chronic angle-closure glaucoma, bilateral, mild stage: Secondary | ICD-10-CM | POA: Diagnosis not present

## 2016-06-01 ENCOUNTER — Other Ambulatory Visit: Payer: Self-pay

## 2016-06-01 DIAGNOSIS — E1121 Type 2 diabetes mellitus with diabetic nephropathy: Secondary | ICD-10-CM

## 2016-06-01 MED ORDER — LOSARTAN POTASSIUM 25 MG PO TABS: 25.0000 mg | ORAL_TABLET | Freq: Every day | ORAL | 1 refills | Status: DC

## 2016-06-07 ENCOUNTER — Telehealth: Payer: Self-pay

## 2016-06-07 NOTE — Telephone Encounter (Signed)
Pt calling to get potasium medication sent into [facility].

## 2016-06-08 ENCOUNTER — Ambulatory Visit (INDEPENDENT_AMBULATORY_CARE_PROVIDER_SITE_OTHER): Payer: Medicare HMO | Admitting: Internal Medicine

## 2016-06-08 ENCOUNTER — Encounter: Payer: Self-pay | Admitting: Internal Medicine

## 2016-06-08 ENCOUNTER — Ambulatory Visit: Payer: Medicare HMO | Admitting: Internal Medicine

## 2016-06-08 ENCOUNTER — Other Ambulatory Visit: Payer: Self-pay | Admitting: Internal Medicine

## 2016-06-08 VITALS — BP 118/70 | HR 67 | Ht 64.0 in | Wt 188.0 lb

## 2016-06-08 DIAGNOSIS — E1121 Type 2 diabetes mellitus with diabetic nephropathy: Secondary | ICD-10-CM | POA: Diagnosis not present

## 2016-06-08 DIAGNOSIS — N939 Abnormal uterine and vaginal bleeding, unspecified: Secondary | ICD-10-CM

## 2016-06-08 MED ORDER — LOSARTAN POTASSIUM 25 MG PO TABS: 25.0000 mg | ORAL_TABLET | Freq: Every day | ORAL | 3 refills | Status: DC

## 2016-06-08 NOTE — Addendum Note (Signed)
Addended by: Glean Hess on: 06/08/2016 09:54 AM   Modules accepted: Orders

## 2016-06-08 NOTE — Telephone Encounter (Signed)
I have never prescribed the potassium and there is no dose or instructions in the chart.  Ask to have the pharmacy request the refill.

## 2016-06-08 NOTE — Progress Notes (Signed)
Date:  06/08/2016   Name:  Natalie Gibbs   DOB:  November 25, 1940   MRN:  [id number]   Chief Complaint: Vaginal Bleeding (Friday X 4 days ago her daughter fell. She lifted her and started spotting. Spotting stopped last night. It was just "a little drop or two, not a spoon full". Would like checked out just to be sure. ) Vaginal Bleeding  The patient's primary symptoms include vaginal bleeding. The patient's pertinent negatives include no pelvic pain or vaginal discharge. This is a new problem. The current episode started in the past 7 days. The problem occurs intermittently. The problem has been gradually improving. The patient is experiencing no pain. Pertinent negatives include no chills or fever. The vaginal discharge was bloody and scant. The vaginal bleeding is spotting. She has not been passing clots. She has not been passing tissue. The symptoms are aggravated by heavy lifting (had to lift her daughter 5 days ago). She has tried nothing for the symptoms.     Review of Systems  Constitutional: Negative for chills, fatigue and fever.  Respiratory: Negative for chest tightness and shortness of breath.   Cardiovascular: Negative for chest pain and leg swelling.  Genitourinary: Positive for vaginal bleeding. Negative for difficulty urinating, genital sores, pelvic pain and vaginal discharge.    Patient Active Problem List   Diagnosis Date Noted  . Type 2 diabetes mellitus with microalbuminuria, without long-term current use of insulin ([place]) 05/15/2014  . Allergic state 05/15/2014  . Fibrocystic breast 05/15/2014  . Glaucoma 05/15/2014    Prior to Admission medications   Medication Sig Start Date End Date Taking? Authorizing Provider  aspirin 81 MG tablet Take 81 mg by mouth daily.   Yes [provider]  brimonidine (ALPHAGAN) 0.2 % ophthalmic solution 3 (three) times daily.   Yes [provider]  dorzolamide (TRUSOPT) 2 % ophthalmic solution Place 1 drop into  both eyes 2 (two) times daily.    Yes [provider]  latanoprost (XALATAN) 0.005 % ophthalmic solution 1 drop at bedtime.   Yes [provider]  losartan (COZAAR) 25 MG tablet Take 1 tablet (25 mg total) by mouth daily. 06/01/16  Yes Glean Hess, MD  POTASSIUM CHLORIDE ER PO Take by mouth daily.   Yes [provider]    Allergies  Allergen Reactions  . Iodine Itching  . Shellfish Allergy Itching  . Codeine Rash    Past Surgical History:  Procedure Laterality Date  . BREAST BIOPSY Left 20 + yrs ago   benign  . BREAST CYST ASPIRATION Left 2015   neg  . CATARACT EXTRACTION, BILATERAL Bilateral 2017  . COLONOSCOPY  2010   Dr. Candace Cruise, [facility]  . COLONOSCOPY N/A 05/13/2014   Procedure: COLONOSCOPY;  Surgeon: Hulen Luster, MD;  Location: [facility] ENDOSCOPY;  Service: Gastroenterology;  Laterality: N/A;  . excision mass abdominal wall  1997  . TUBAL LIGATION      Social History  Substance Use Topics  . Smoking status: Never Smoker  . Smokeless tobacco: Never Used  . Alcohol use No     Medication list has been reviewed and updated.   Physical Exam  Constitutional: She is oriented to person, place, and time. She appears well-developed. No distress.  HENT:  Head: Normocephalic and atraumatic.  Cardiovascular: Normal rate, regular rhythm and normal heart sounds.   Pulmonary/Chest: Effort normal and breath sounds normal. No respiratory distress.  Genitourinary: Vagina normal. There is no tenderness, lesion or  injury on the right labia. There is no tenderness, lesion or injury on the left labia. Uterus is not tender. Cervix exhibits discharge (scant dark blood). Right adnexum displays no mass and no tenderness. Left adnexum displays no mass and no tenderness. No erythema in the vagina.  Musculoskeletal: Normal range of motion.  Neurological: She is alert and oriented to person, place, and time.  Skin: No rash noted.  Psychiatric: She has a normal mood and affect.  Her behavior is normal. Thought content normal.  Nursing note and vitals reviewed.   BP 118/70 (BP Location: Right Arm, Patient Position: Sitting, Cuff Size: Normal)   Pulse 67   Ht 5\' 4"  (1.626 m)   Wt 188 lb (85.3 kg)   SpO2 96%   BMI 32.27 kg/m   Assessment and Plan: 1. Abnormal vaginal bleeding - US Transvaginal Non-OB; Future  2. Type 2 diabetes mellitus with diabetic nephropathy, without long-term current use of insulin ([facility]) - losartan (COZAAR) 25 MG tablet; Take 1 tablet (25 mg total) by mouth daily.  Dispense: 90 tablet; Refill: 3   Meds ordered this encounter  Medications  . losartan (COZAAR) 25 MG tablet    Sig: Take 1 tablet (25 mg total) by mouth daily.    Dispense:  90 tablet    Refill:  [place], MD [facility]  06/08/2016

## 2016-06-08 NOTE — Patient Instructions (Signed)
Abnormal Uterine Bleeding Abnormal uterine bleeding can affect women at various stages in life, including teenagers, women in their reproductive years, pregnant women, and women who have reached menopause. Several kinds of uterine bleeding are considered abnormal, including:  Bleeding or spotting between periods.  Bleeding after sexual intercourse.  Bleeding that is heavier or more than normal.  Periods that last longer than usual.  Bleeding after menopause. Many cases of abnormal uterine bleeding are minor and simple to treat, while others are more serious. Any type of abnormal bleeding should be evaluated by your health care provider. Treatment will depend on the cause of the bleeding. Follow these instructions at home: Monitor your condition for any changes. The following actions may help to alleviate any discomfort you are experiencing:  Avoid the use of tampons and douches as directed by your health care provider.  Change your pads frequently. You should get regular pelvic exams and Pap tests. Keep all follow-up appointments for diagnostic tests as directed by your health care provider. Contact a health care provider if:  Your bleeding lasts more than 1 week.  You feel dizzy at times. Get help right away if:  You pass out.  You are changing pads every 15 to 30 minutes.  You have abdominal pain.  You have a fever.  You become sweaty or weak.  You are passing large blood clots from the vagina.  You start to feel nauseous and vomit. This information is not intended to replace advice given to you by your health care provider. Make sure you discuss any questions you have with your health care provider. Document Released: 12/21/2004 Document Revised: 06/04/2015 Document Reviewed: 07/20/2012 Elsevier Interactive Patient Education  2017 Elsevier Inc.  

## 2016-06-11 ENCOUNTER — Ambulatory Visit
Admission: RE | Admit: 2016-06-11 | Discharge: 2016-06-11 | Disposition: A | Discharge status: Home / Self Care | Payer: Medicare HMO | Source: Ambulatory Visit | Attending: Internal Medicine | Admitting: Internal Medicine

## 2016-06-11 DIAGNOSIS — R938 Abnormal findings on diagnostic imaging of other specified body structures: Secondary | ICD-10-CM | POA: Diagnosis not present

## 2016-06-11 DIAGNOSIS — D259 Leiomyoma of uterus, unspecified: Secondary | ICD-10-CM | POA: Insufficient documentation

## 2016-06-11 DIAGNOSIS — N939 Abnormal uterine and vaginal bleeding, unspecified: Secondary | ICD-10-CM | POA: Diagnosis present

## 2016-06-14 ENCOUNTER — Other Ambulatory Visit: Payer: Self-pay | Admitting: Internal Medicine

## 2016-06-14 DIAGNOSIS — N8500 Endometrial hyperplasia, unspecified: Secondary | ICD-10-CM | POA: Insufficient documentation

## 2016-06-14 DIAGNOSIS — N950 Postmenopausal bleeding: Secondary | ICD-10-CM

## 2016-06-16 ENCOUNTER — Other Ambulatory Visit: Payer: Self-pay | Admitting: Internal Medicine

## 2016-06-16 DIAGNOSIS — E1121 Type 2 diabetes mellitus with diabetic nephropathy: Secondary | ICD-10-CM

## 2016-06-16 MED ORDER — LOSARTAN POTASSIUM 25 MG PO TABS: 25.0000 mg | ORAL_TABLET | Freq: Every day | ORAL | 3 refills | Status: DC

## 2016-07-06 ENCOUNTER — Encounter: Payer: Self-pay | Admitting: Obstetrics & Gynecology

## 2016-07-06 ENCOUNTER — Ambulatory Visit (INDEPENDENT_AMBULATORY_CARE_PROVIDER_SITE_OTHER): Payer: Medicare HMO | Admitting: Obstetrics & Gynecology

## 2016-07-06 VITALS — BP 128/80 | HR 78 | Ht 64.0 in | Wt 190.0 lb

## 2016-07-06 DIAGNOSIS — N840 Polyp of corpus uteri: Secondary | ICD-10-CM | POA: Diagnosis not present

## 2016-07-06 DIAGNOSIS — N841 Polyp of cervix uteri: Secondary | ICD-10-CM

## 2016-07-06 DIAGNOSIS — N950 Postmenopausal bleeding: Secondary | ICD-10-CM | POA: Diagnosis not present

## 2016-07-06 DIAGNOSIS — N858 Other specified noninflammatory disorders of uterus: Secondary | ICD-10-CM | POA: Diagnosis not present

## 2016-07-06 NOTE — Patient Instructions (Signed)

## 2016-07-06 NOTE — Progress Notes (Signed)
Postmenopausal Bleeding Patient complains of vaginal bleeding. She has been menopausal for several years. Currently on no HRT or blood thinners.  Bleeding is described as one episode of bleeding, may have been after lifting, and has occurred 1 times. Other menopausal symptoms include: none. Workup to date: pelvic ultrasound.  Menstrual History: OB History    Gravida Para Term Preterm AB Living   4         4   SAB TAB Ectopic Multiple Live Births                  Obstetric Comments   Age with first pregnancy-24     No LMP recorded. Patient is postmenopausal.     PMHx: She  has a past medical history of Allergy; Arthritis; Diffuse cystic mastopathy; Glaucoma; Obesity, unspecified; Special screening for malignant neoplasms, colon; and Type II diabetes mellitus with renal manifestations ([place]) (05/15/2014). Also,  has a past surgical history that includes Tubal ligation; excision mass abdominal wall (1997); Colonoscopy (2010); Colonoscopy (N/A, 05/13/2014); Cataract extraction, bilateral (Bilateral, 2017); Breast cyst aspiration (Left, 2015); and Breast biopsy (Left, 20 + yrs ago)., family history includes Breast cancer in her other; Diabetes in her sister.,  reports that she has never smoked. She has never used smokeless tobacco. She reports that she does not drink alcohol or use drugs.  She has a current medication list which includes the following prescription(s): aspirin, brimonidine, dorzolamide, latanoprost, losartan, and potassium chloride. Also, is allergic to iodine; shellfish allergy; and codeine.  Review of Systems  Constitutional: Negative for chills, fever and malaise/fatigue.  HENT: Negative for congestion, sinus pain and sore throat.   Eyes: Negative for blurred vision and pain.  Respiratory: Negative for cough and wheezing.   Cardiovascular: Negative for chest pain and leg swelling.  Gastrointestinal: Negative for abdominal pain, constipation, diarrhea, heartburn, nausea and  vomiting.  Genitourinary: Negative for dysuria, frequency, hematuria and urgency.  Musculoskeletal: Negative for back pain, joint pain, myalgias and neck pain.  Skin: Negative for itching and rash.  Neurological: Negative for dizziness, tremors and weakness.  Endo/Heme/Allergies: Does not bruise/bleed easily.  Psychiatric/Behavioral: Negative for depression. The patient is not nervous/anxious and does not have insomnia.     Objective: BP 128/80   Pulse 78   Ht 5\' 4"  (1.626 m)   Wt 190 lb (86.2 kg)   BMI 32.61 kg/m  Physical Exam  Constitutional: She is oriented to person, place, and time. She appears well-developed and well-nourished. No distress.  Genitourinary: Vagina normal and uterus normal. Pelvic exam was performed with patient supine. There is no rash, tenderness or lesion on the right labia. There is no rash, tenderness or lesion on the left labia. No erythema or bleeding in the vagina. Right adnexum does not display mass and does not display tenderness. Left adnexum does not display mass and does not display tenderness. Cervix does not exhibit motion tenderness, discharge, polyp or nabothian cyst.   Uterus is mobile and midaxial. Uterus is not enlarged or exhibiting a mass.  Abdominal: Soft. She exhibits no distension. There is no tenderness.  Musculoskeletal: Normal range of motion.  Neurological: She is alert and oriented to person, place, and time. No cranial nerve deficit.  Skin: Skin is warm and dry.  Psychiatric: She has a normal mood and affect.    Review of ULTRASOUND.    I have personally reviewed images and report of recent ultrasound done at [facility].    Plan of management to be discussed with  patient. TRANSABDOMINAL AND TRANSVAGINAL ULTRASOUND OF PELVIS TECHNIQUE: Study was performed transabdominally to optimize pelvic field of view evaluation and transvaginally to optimize internal visceral architecture evaluation. COMPARISON:   None FINDINGS: Uterus Measurements: 7.2 x 4.5 x 5.6 cm. There is a focal mass along the leftward aspect of the myometrium adjacent to the endometrium measuring 1.7 x 2.5 x 2.1 cm. This lesion contains foci of calcification. More superiorly, there is a mixed echogenicity mass with calcification measuring 1.6 x 1.8 x 1.6 cm. There is a mass in the superior fundal region of slightly mixed echogenicity measuring 2.1 x 1.9 x 1.8 cm. Endometrium Thickness: 18 mm. The endometrium appears mildly inhomogeneous in echotexture.  Right ovary Unable to visualize by either transabdominal or transvaginal technique. No right-sided pelvic mass evident.  Left ovary Unable to visualize by either transabdominal or transvaginal technique. No left-sided adnexal mass evident.  Other findings No abnormal free fluid.  IMPRESSION: Leiomyomatous uterus. Diffuse thickening of the endometrium for age with mild inhomogeneous echotexture. Endometrial sampling is felt to be warranted, particularly given history of vaginal bleeding.  Neither ovary could be visualized, likely due to overlying gas and postmenopausal state. No extrauterine pelvic mass or fluid evident.  Endometrial Biopsy After discussion with the patient regarding her abnormal uterine bleeding I recommended that she proceed with an endometrial biopsy for further diagnosis. The risks, benefits, alternatives, and indications for an endometrial biopsy were discussed with the patient in detail. She understood the risks including infection, bleeding, cervical laceration and uterine perforation.  Verbal consent was obtained.   PROCEDURE NOTE:  [place] endometrial biopsy was performed using aseptic technique with iodine preparation.  The uterus was sounded to a length of 6 cm.  Adequate sampling was obtained with minimal blood loss.  The patient tolerated the procedure well.  Disposition will be pending pathology.  Polypectomy: Cervix visualized  and polyp noted.  Ring forcep applied to cervix and twisting motion removed polyp intact.  Hemostasis obtained.  ASSESSMENT/PLAN:   Problem List Items Addressed This Visit      Other   Post-menopausal bleeding - Primary   Relevant Orders   Pathology- polyp   Pathology- EMB    Other Visit Diagnoses    Cervical polyp       Relevant Orders   Removed    [place] discussed. Fibroids counseled. Call w EMB results; if negative then no further therapy and monitor for bleeding. Monitor for bleeding after polypectomy  Barnett Applebaum, MD, [facility], [facility] 07/06/2016  9:41 AM

## 2016-07-09 LAB — PATHOLOGY

## 2016-07-15 NOTE — Progress Notes (Signed)
D/w pt, benign polyp.  Pt reports no further bleeding.

## 2016-10-01 ENCOUNTER — Encounter: Payer: Self-pay | Admitting: Internal Medicine

## 2016-10-01 ENCOUNTER — Ambulatory Visit (INDEPENDENT_AMBULATORY_CARE_PROVIDER_SITE_OTHER): Payer: Medicare HMO | Admitting: Internal Medicine

## 2016-10-01 VITALS — BP 104/72 | HR 66 | Ht 64.0 in | Wt 189.0 lb

## 2016-10-01 DIAGNOSIS — R809 Proteinuria, unspecified: Secondary | ICD-10-CM | POA: Diagnosis not present

## 2016-10-01 DIAGNOSIS — E1129 Type 2 diabetes mellitus with other diabetic kidney complication: Secondary | ICD-10-CM

## 2016-10-01 DIAGNOSIS — N8500 Endometrial hyperplasia, unspecified: Secondary | ICD-10-CM

## 2016-10-01 NOTE — Progress Notes (Signed)
Date:  10/01/2016   Name:  Natalie Gibbs   DOB:  April 05, 1940   MRN:  [id number]   Chief Complaint: Diabetes Diabetes  She presents for her follow-up diabetic visit. She has type 2 diabetes mellitus. Pertinent negatives for hypoglycemia include no tremors. Pertinent negatives for diabetes include no fatigue, no polydipsia and no polyuria.  Postmenopausal bleeding - she diagnosed with mild endometrial hyperplasia and was seen by GYN. She underwent endometrial biopsy which showed 2 benign polyps. She's had no further bleeding and feels well.  Lab Results  Component Value Date   HGBA1C 6.1 (H) 03/31/2016     Review of Systems  Constitutional: Negative for appetite change, fatigue, fever and unexpected weight change.  HENT: Negative for tinnitus and trouble swallowing.   Eyes: Negative for visual disturbance.  Respiratory: Negative for cough and chest tightness.   Cardiovascular: Negative for leg swelling.  Gastrointestinal: Negative for abdominal pain.  Endocrine: Negative for polydipsia and polyuria.  Genitourinary: Negative for dysuria and hematuria.  Musculoskeletal: Negative for arthralgias.  Neurological: Negative for tremors and numbness.  Psychiatric/Behavioral: Negative for dysphoric mood.    Patient Active Problem List   Diagnosis Date Noted  . Post-menopausal bleeding 06/14/2016  . Endometrial hyperplasia 06/14/2016  . Type 2 diabetes mellitus with microalbuminuria, without long-term current use of insulin ([place]) 05/15/2014  . Allergic state 05/15/2014  . Fibrocystic breast 05/15/2014  . Glaucoma 05/15/2014    Prior to Admission medications   Medication Sig Start Date End Date Taking? Authorizing Provider  aspirin 81 MG tablet Take 81 mg by mouth daily.   Yes [provider]  brimonidine (ALPHAGAN) 0.2 % ophthalmic solution 3 (three) times daily.   Yes [provider]  dorzolamide (TRUSOPT) 2 % ophthalmic solution Place 1 drop into both  eyes 2 (two) times daily.    Yes [provider]  latanoprost (XALATAN) 0.005 % ophthalmic solution 1 drop at bedtime.   Yes [provider]  losartan (COZAAR) 25 MG tablet Take 1 tablet (25 mg total) by mouth daily. 06/16/16  Yes Glean Hess, MD  POTASSIUM CHLORIDE ER PO Take by mouth daily.   Yes [provider]    Allergies  Allergen Reactions  . Iodine Itching  . Shellfish Allergy Itching  . Codeine Rash    Past Surgical History:  Procedure Laterality Date  . BREAST BIOPSY Left 20 + yrs ago   benign  . BREAST CYST ASPIRATION Left 2015   neg  . CATARACT EXTRACTION, BILATERAL Bilateral 2017  . COLONOSCOPY  2010   Dr. Candace Cruise, [facility]  . COLONOSCOPY N/A 05/13/2014   Procedure: COLONOSCOPY;  Surgeon: Hulen Luster, MD;  Location: [facility] ENDOSCOPY;  Service: Gastroenterology;  Laterality: N/A;  . excision mass abdominal wall  1997  . TUBAL LIGATION      Social History  Substance Use Topics  . Smoking status: Never Smoker  . Smokeless tobacco: Never Used  . Alcohol use No     Medication list has been reviewed and updated.  PHQ 2/9 Scores 03/31/2016 02/04/2015  PHQ - 2 Score 0 0    Physical Exam  Constitutional: She is oriented to person, place, and time. She appears well-developed. No distress.  HENT:  Head: Normocephalic and atraumatic.  Neck: Normal range of motion. Carotid bruit is not present. No thyromegaly present.  Cardiovascular: Normal rate, regular rhythm and normal heart sounds.   Pulmonary/Chest: Effort normal and breath sounds normal. No respiratory distress. She has  no wheezes.  Musculoskeletal: She exhibits no edema.  Neurological: She is alert and oriented to person, place, and time.  Skin: Skin is warm and dry. No rash noted.  Psychiatric: She has a normal mood and affect. Her behavior is normal. Thought content normal.  Nursing note and vitals reviewed.   BP 104/72 (BP Location: Right Arm, Patient Position: Sitting, Cuff Size:  Normal)   Pulse 66   Ht 5\' 4"  (1.626 m)   Wt 189 lb (85.7 kg)   SpO2 96%   BMI 32.44 kg/m   Assessment and Plan: 1. Type 2 diabetes mellitus with microalbuminuria, without long-term current use of insulin (HCC) Diet controlled On ARB - Hemoglobin A1c  2. Endometrial hyperplasia Resolved bleeding   No orders of the defined types were placed in this encounter.   Partially dictated using Editor, commissioning. Any errors are unintentional.  Halina Maidens, MD [facility]  10/01/2016

## 2016-10-02 LAB — HEMOGLOBIN A1C
Est. average glucose Bld gHb Est-mCnc: 123 mg/dL
Hgb A1c MFr Bld: 5.9 % — ABNORMAL HIGH (ref 4.8–5.6)

## 2016-10-11 ENCOUNTER — Inpatient Hospital Stay: Payer: Self-pay

## 2016-10-11 ENCOUNTER — Ambulatory Visit (INDEPENDENT_AMBULATORY_CARE_PROVIDER_SITE_OTHER): Payer: Medicare HMO | Admitting: General Surgery

## 2016-10-11 ENCOUNTER — Encounter: Payer: Self-pay | Admitting: General Surgery

## 2016-10-11 ENCOUNTER — Telehealth: Payer: Self-pay | Admitting: *Deleted

## 2016-10-11 VITALS — BP 124/74 | HR 78 | Resp 12 | Ht 60.0 in | Wt 188.0 lb

## 2016-10-11 DIAGNOSIS — N6019 Diffuse cystic mastopathy of unspecified breast: Secondary | ICD-10-CM

## 2016-10-11 DIAGNOSIS — N6452 Nipple discharge: Secondary | ICD-10-CM | POA: Diagnosis not present

## 2016-10-11 DIAGNOSIS — N6012 Diffuse cystic mastopathy of left breast: Secondary | ICD-10-CM

## 2016-10-11 NOTE — Telephone Encounter (Signed)
Spoke with the patient and she is aware of date and time of Pre-admission appointment.

## 2016-10-11 NOTE — Patient Instructions (Signed)
Will schedule for left breast mass excision procedure in the near future.

## 2016-10-11 NOTE — Progress Notes (Signed)
[name], female   DOB: 07-28-1940, 76 y.o.   MRN: [id number]  Chief Complaint  Patient presents with  . Follow-up    HPI Natalie Gibbs is a 76 y.o. female here today for clear discharge from left nipple. She noticed a clear spot of discharge from her left nipple last week after hitting a wall. She states the last time she noticed this again was over a year ago. Last mammogram was 04/09/2016 cat 1. History of FCD.   HPI  Past Medical History:  Diagnosis Date  . Allergy   . Arthritis   . Diffuse cystic mastopathy   . Glaucoma   . Obesity, unspecified   . Special screening for malignant neoplasms, colon   . Type II diabetes mellitus with renal manifestations ([place]) 05/15/2014    Past Surgical History:  Procedure Laterality Date  . BREAST BIOPSY Left 20 + yrs ago   benign  . BREAST CYST ASPIRATION Left 2015   neg  . CATARACT EXTRACTION, BILATERAL Bilateral 2017  . COLONOSCOPY  2010   Dr. Candace Cruise, [facility]  . COLONOSCOPY N/A 05/13/2014   Procedure: COLONOSCOPY;  Surgeon: Hulen Luster, MD;  Location: [facility] ENDOSCOPY;  Service: Gastroenterology;  Laterality: N/A;  . excision mass abdominal wall  1997  . TUBAL LIGATION      Family History  Problem Relation Age of Onset  . Breast cancer Other        niece/breast  . Diabetes Sister     Social History Social History  Substance Use Topics  . Smoking status: Never Smoker  . Smokeless tobacco: Never Used  . Alcohol use No    Allergies  Allergen Reactions  . Iodine Itching  . Shellfish Allergy Itching  . Codeine Rash    Current Outpatient Prescriptions  Medication Sig Dispense Refill  . aspirin 81 MG tablet Take 81 mg by mouth daily.    . brimonidine (ALPHAGAN) 0.2 % ophthalmic solution 3 (three) times daily.    . dorzolamide (TRUSOPT) 2 % ophthalmic solution Place 1 drop into both eyes 2 (two) times daily.     [name] latanoprost (XALATAN) 0.005 % ophthalmic solution 1 drop at bedtime.    [name] losartan  (COZAAR) 25 MG tablet Take 1 tablet (25 mg total) by mouth daily. 90 tablet 3  . POTASSIUM CHLORIDE ER PO Take by mouth daily.     No current facility-administered medications for this visit.     Review of Systems Review of Systems  Constitutional: Negative.   Respiratory: Negative.   Cardiovascular: Negative.     Blood pressure 124/74, pulse 78, resp. rate 12, height 5' (1.524 m), weight 188 lb (85.3 kg).  Physical Exam Physical Exam  Constitutional: She is oriented to person, place, and time. She appears well-developed and well-nourished.  Eyes: Conjunctivae are normal. No scleral icterus.  Neck: Neck supple.  Cardiovascular: Normal rate, regular rhythm and normal heart sounds.   Pulmonary/Chest: Effort normal and breath sounds normal. No respiratory distress. Right breast exhibits nipple discharge ( milky discharge with manipulation). Right breast exhibits no inverted nipple, no mass, no skin change and no tenderness. Left breast exhibits nipple discharge (watery discharge from upper outer nipple with manipulation). Left breast exhibits no inverted nipple, no mass, no skin change and no tenderness.  Abdominal: Soft. Bowel sounds are normal. There is no tenderness.  Lymphadenopathy:    She has no cervical adenopathy.    She has no axillary adenopathy.  Neurological: She  is alert and oriented to person, place, and time.  Skin: Skin is warm and dry.  Psychiatric: She has a normal mood and affect. Her behavior is normal.    Data Reviewed Prior notes and mammogram from April 2018 reviewed [place] of left breast in areolar region showed 2 irregularly shaped hypoechoic masses in the superior portion of the areola. On physical exam pressure over this site elicits watery drainage.  Mammogram on 04/09/16 showed no evidence of malignancy. 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 Physical exam today showed milky discharge from right nipple and watery discharge from left upper outer nipple-this is benign. Watery discharge from left nipple on pressure from superior aspect.    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 Recommended excision and pt is agreeable. Will schedule for left breast mass excision procedure in the near future.     HPI, Physical Exam, Assessment and Plan have been scribed under the direction and in the presence of Mckinley Jewel, MD  Natalie Gibbs, CMA  I have completed the exam and reviewed the above documentation for accuracy and completeness.  I agree with the above.  Natalie Gibbs has been used and any errors in dictation or transcription are unintentional.  Natalie Gibbs, M.D., F.A.C.S.   Natalie Gibbs 10/11/2016, 1:26 PM   Patient's surgery has been scheduled for 10-26-16 at [facility]. It is okay for patient to continue an 81 mg aspirin once daily.    Natalie Gibbs, CMA

## 2016-10-11 NOTE — Telephone Encounter (Signed)
Called patient on home number but no answer and not able to leave a message.   We need to inform her of pre-admission appointment date and time.

## 2016-10-15 ENCOUNTER — Inpatient Hospital Stay: Admission: RE | Admit: 2016-10-15 | Payer: Medicare HMO | Source: Ambulatory Visit

## 2016-10-19 ENCOUNTER — Encounter
Admission: RE | Admit: 2016-10-19 | Discharge: 2016-10-19 | Disposition: A | Discharge status: Home / Self Care | Payer: Medicare HMO | Source: Ambulatory Visit | Attending: General Surgery | Admitting: General Surgery

## 2016-10-19 DIAGNOSIS — E119 Type 2 diabetes mellitus without complications: Secondary | ICD-10-CM | POA: Diagnosis not present

## 2016-10-19 DIAGNOSIS — N6452 Nipple discharge: Secondary | ICD-10-CM | POA: Diagnosis not present

## 2016-10-19 DIAGNOSIS — R001 Bradycardia, unspecified: Secondary | ICD-10-CM | POA: Insufficient documentation

## 2016-10-19 DIAGNOSIS — I10 Essential (primary) hypertension: Secondary | ICD-10-CM | POA: Diagnosis not present

## 2016-10-19 DIAGNOSIS — Z01818 Encounter for other preprocedural examination: Secondary | ICD-10-CM | POA: Diagnosis present

## 2016-10-19 HISTORY — DX: Essential (primary) hypertension: I10

## 2016-10-19 HISTORY — DX: Prediabetes: R73.03

## 2016-10-19 LAB — BASIC METABOLIC PANEL
Anion gap: 9 (ref 5–15)
BUN: 15 mg/dL (ref 6–20)
CALCIUM: 9.2 mg/dL (ref 8.9–10.3)
CO2: 26 mmol/L (ref 22–32)
CREATININE: 0.67 mg/dL (ref 0.44–1.00)
Chloride: 105 mmol/L (ref 101–111)
Glucose, Bld: 116 mg/dL — ABNORMAL HIGH (ref 65–99)
Potassium: 4.1 mmol/L (ref 3.5–5.1)
SODIUM: 140 mmol/L (ref 135–145)

## 2016-10-19 NOTE — Patient Instructions (Signed)
Your procedure is scheduled on: Tuesday Oct. 23, 2018. Report to Same Day Surgery. To find out your arrival time please call [phone] between 1PM - 3PM on Monday Oct. 22, 2018.  Remember: Instructions that are not followed completely may result in serious medical risk, up to and including death, or upon the discretion of your surgeon and anesthesiologist your surgery may need to be rescheduled.     _X__ 1. Do not eat food after midnight the night before your procedure.                 No gum chewing or hard candies. You may drink clear liquids up to 2 hours                 before you are scheduled to arrive for your surgery- DO not drink clear                 liquids within 2 hours of the start of your surgery.                 Clear Liquids include:  water, apple juice without pulp, clear carbohydrate                 drink such as Clearfast of Gartorade, Black Coffee or Tea (Do not add                 anything to coffee or tea).     ___ 2.  No Alcohol for 24 hours before or after surgery.   ___ 3.  Do Not Smoke or use e-cigarettes For 24 Hours Prior to Your Surgery.                 Do not use any chewable tobacco products for at least 6 hours prior to                 surgery.  ____  4.  Bring all medications with you on the day of surgery if instructed.   __x__  5.  Notify your doctor if there is any change in your medical condition      (cold, fever, infections).     Do not wear jewelry, make-up, hairpins, clips or nail polish. Do not wear lotions, powders, or perfumes. You may wear deodorant. Do not shave 48 hours prior to surgery. Men may shave face and neck. Do not bring valuables to the hospital.    [facility] is not responsible for any belongings or valuables.  Contacts, dentures or bridgework may not be worn into surgery. Leave your suitcase in the car. After surgery it may be brought to your room. For patients admitted to the hospital, discharge  time is determined by your treatment team.   Patients discharged the day of surgery will not be allowed to drive home.   Please read over the following fact sheets that you were given:   Preparing for Surgery.          ____ Take these medicines the morning of surgery with A SIP OF WATER: None    ____ Fleet Enema (as directed)   __x__ Use CHG Soap as directed  ____ Use inhalers on the day of surgery  ____ Stop metformin 2 days prior to surgery    ____ Take 1/2 of usual insulin dose the night before surgery. No insulin the morning          of surgery.   ____ Stop Coumadin/Plavix/aspirin does not apply.  _x___ Stop Anti-inflammatories Advil, Aleve, Motrin, Ibuprofen, Naproxen, Naprosyn, Goodies Powders or Aspirin products now.  Tylenol is ok.   ____ Stop supplements until after surgery.    ____ Bring C-Pap to the hospital.

## 2016-10-19 NOTE — Patient Instructions (Signed)
Your procedure is scheduled on: Tuesday Oct. 23, 2018. Report to Same Day surgery. To find out your arrival time please call [phone] between 1PM - 3PM on Monday Oct. 22, 2018.  Remember: Instructions that are not followed completely may result in serious medical risk, up to and including death, or upon the discretion of your surgeon and anesthesiologist your surgery may need to be rescheduled.     _X__ 1. Do not eat food after midnight the night before your procedure.                 No gum chewing or hard candies. You may drink clear liquids up to 2 hours                 before you are scheduled to arrive for your surgery- DO not drink clear                 liquids within 2 hours of the start of your surgery.                 Clear Liquids include:  water, apple juice without pulp, clear carbohydrate                 drink such as Clearfast of Gartorade, Black Coffee or Tea (Do not add                 anything to coffee or tea).     ___ 2.  No Alcohol for 24 hours before or after surgery.   ___ 3.  Do Not Smoke or use e-cigarettes For 24 Hours Prior to Your Surgery.                 Do not use any chewable tobacco products for at least 6 hours prior to                 surgery.  ____  4.  Bring all medications with you on the day of surgery if instructed.   __x__  5.  Notify your doctor if there is any change in your medical condition      (cold, fever, infections).     Do not wear jewelry, make-up, hairpins, clips or nail polish. Do not wear lotions, powders, or perfumes. You may wear deodorant. Do not shave 48 hours prior to surgery. Men may shave face and neck. Do not bring valuables to the hospital.    [facility] is not responsible for any belongings or valuables.  Contacts, dentures or bridgework may not be worn into surgery. Leave your suitcase in the car. After surgery it may be brought to your room. For patients admitted to the hospital, discharge  time is determined by your treatment team.   Patients discharged the day of surgery will not be allowed to drive home.   Please read over the following fact sheets that you were given:   Preparing for surgery.          ____ Take these medicines the morning of surgery with A SIP OF WATER:      ____ Fleet Enema (as directed)   __x__ Use CHG Soap as directed  ____ Use inhalers on the day of surgery  ____ Stop metformin 2 days prior to surgery    ____ Take 1/2 of usual insulin dose the night before surgery. No insulin the morning          of surgery.   ____ Stop Coumadin/Plavix/aspirin on does not  apply.  __x__ Stop Anti-inflammatories on Advil, Aleve, Motrin, Ibuprofen, Naproxen, Naprosyn, Goodies Powders or Aspirin products, Tylenol is ok to take.   ____ Stop supplements until after surgery.    ____ Bring C-Pap to the hospital.

## 2016-10-26 ENCOUNTER — Ambulatory Visit: Payer: Medicare HMO | Admitting: Anesthesiology

## 2016-10-26 ENCOUNTER — Ambulatory Visit
Admission: RE | Admit: 2016-10-26 | Discharge: 2016-10-26 | Disposition: A | Discharge status: Home Health | Payer: Medicare HMO | Source: Ambulatory Visit | Attending: General Surgery | Admitting: General Surgery

## 2016-10-26 ENCOUNTER — Encounter: Payer: Self-pay | Admitting: *Deleted

## 2016-10-26 ENCOUNTER — Encounter
Admission: RE | Disposition: A | Discharge status: Home Health | Payer: Self-pay | Source: Ambulatory Visit | Attending: General Surgery

## 2016-10-26 DIAGNOSIS — E669 Obesity, unspecified: Secondary | ICD-10-CM | POA: Diagnosis not present

## 2016-10-26 DIAGNOSIS — Z7982 Long term (current) use of aspirin: Secondary | ICD-10-CM | POA: Insufficient documentation

## 2016-10-26 DIAGNOSIS — Z6832 Body mass index (BMI) 32.0-32.9, adult: Secondary | ICD-10-CM | POA: Diagnosis not present

## 2016-10-26 DIAGNOSIS — N6452 Nipple discharge: Secondary | ICD-10-CM

## 2016-10-26 DIAGNOSIS — E119 Type 2 diabetes mellitus without complications: Secondary | ICD-10-CM | POA: Insufficient documentation

## 2016-10-26 DIAGNOSIS — I10 Essential (primary) hypertension: Secondary | ICD-10-CM | POA: Insufficient documentation

## 2016-10-26 DIAGNOSIS — Z79899 Other long term (current) drug therapy: Secondary | ICD-10-CM | POA: Insufficient documentation

## 2016-10-26 DIAGNOSIS — D242 Benign neoplasm of left breast: Secondary | ICD-10-CM | POA: Insufficient documentation

## 2016-10-26 DIAGNOSIS — N6489 Other specified disorders of breast: Secondary | ICD-10-CM | POA: Diagnosis not present

## 2016-10-26 DIAGNOSIS — Z9842 Cataract extraction status, left eye: Secondary | ICD-10-CM | POA: Insufficient documentation

## 2016-10-26 HISTORY — PX: BREAST DUCTAL SYSTEM EXCISION: SHX5242

## 2016-10-26 LAB — GLUCOSE, CAPILLARY
GLUCOSE-CAPILLARY: 108 mg/dL — AB (ref 65–99)
Glucose-Capillary: 102 mg/dL — ABNORMAL HIGH (ref 65–99)

## 2016-10-26 SURGERY — EXCISION DUCTAL SYSTEM BREAST
Anesthesia: General | Site: Breast | Laterality: Left | Wound class: Clean

## 2016-10-26 MED ORDER — SODIUM CHLORIDE 0.9 % IV SOLN
INTRAVENOUS | Status: DC
  Administered 2016-10-26: 09:00:00 via INTRAVENOUS

## 2016-10-26 MED ORDER — MIDAZOLAM HCL 2 MG/2ML IJ SOLN
INTRAMUSCULAR | Status: AC
  Filled 2016-10-26: qty 2

## 2016-10-26 MED ORDER — FENTANYL CITRATE (PF) 100 MCG/2ML IJ SOLN
INTRAMUSCULAR | Status: AC
  Filled 2016-10-26: qty 2

## 2016-10-26 MED ORDER — LACTATED RINGERS IV SOLN
INTRAVENOUS | Status: DC | PRN
  Administered 2016-10-26 (×2): via INTRAVENOUS

## 2016-10-26 MED ORDER — ONDANSETRON HCL 4 MG/2ML IJ SOLN
INTRAMUSCULAR | Status: AC
  Filled 2016-10-26: qty 2

## 2016-10-26 MED ORDER — FAMOTIDINE 20 MG PO TABS
20.0000 mg | ORAL_TABLET | Freq: Once | ORAL | Status: AC
  Administered 2016-10-26: 20 mg via ORAL

## 2016-10-26 MED ORDER — LIDOCAINE HCL (PF) 1 % IJ SOLN
INTRAMUSCULAR | Status: AC
  Filled 2016-10-26: qty 30

## 2016-10-26 MED ORDER — PHENYLEPHRINE HCL 10 MG/ML IJ SOLN
INTRAMUSCULAR | Status: DC | PRN
  Administered 2016-10-26 (×3): 100 ug via INTRAVENOUS

## 2016-10-26 MED ORDER — MIDAZOLAM HCL 2 MG/2ML IJ SOLN
INTRAMUSCULAR | Status: DC | PRN
Start: 2016-10-26 — End: 2016-10-26
  Administered 2016-10-26 (×2): 1 mg via INTRAVENOUS

## 2016-10-26 MED ORDER — HYDROMORPHONE HCL 1 MG/ML IJ SOLN
INTRAMUSCULAR | Status: AC
  Filled 2016-10-26: qty 1

## 2016-10-26 MED ORDER — BUPIVACAINE HCL (PF) 0.5 % IJ SOLN
INTRAMUSCULAR | Status: AC
  Filled 2016-10-26: qty 30

## 2016-10-26 MED ORDER — ONDANSETRON HCL 4 MG/2ML IJ SOLN
INTRAMUSCULAR | Status: DC | PRN
  Administered 2016-10-26: 4 mg via INTRAVENOUS

## 2016-10-26 MED ORDER — FAMOTIDINE 20 MG PO TABS
ORAL_TABLET | ORAL | Status: AC
  Filled 2016-10-26: qty 1

## 2016-10-26 MED ORDER — LIDOCAINE HCL (CARDIAC) 20 MG/ML IV SOLN
INTRAVENOUS | Status: DC | PRN
  Administered 2016-10-26: 40 mg via INTRAVENOUS

## 2016-10-26 MED ORDER — PROPOFOL 10 MG/ML IV BOLUS
INTRAVENOUS | Status: AC
  Filled 2016-10-26: qty 20

## 2016-10-26 MED ORDER — PROPOFOL 10 MG/ML IV BOLUS
INTRAVENOUS | Status: DC | PRN
  Administered 2016-10-26: 150 mg via INTRAVENOUS

## 2016-10-26 MED ORDER — DEXAMETHASONE SODIUM PHOSPHATE 10 MG/ML IJ SOLN
INTRAMUSCULAR | Status: DC | PRN
  Administered 2016-10-26: 10 mg via INTRAVENOUS

## 2016-10-26 MED ORDER — FENTANYL CITRATE (PF) 100 MCG/2ML IJ SOLN: 25.0000 ug | INTRAMUSCULAR | Status: DC | PRN

## 2016-10-26 MED ORDER — LIDOCAINE HCL 1 % IJ SOLN
INTRAMUSCULAR | Status: DC | PRN
  Administered 2016-10-26: 25 mL via INTRAMUSCULAR

## 2016-10-26 MED ORDER — FENTANYL CITRATE (PF) 250 MCG/5ML IJ SOLN
INTRAMUSCULAR | Status: AC
  Filled 2016-10-26: qty 5

## 2016-10-26 MED ORDER — ONDANSETRON HCL 4 MG/2ML IJ SOLN: 4.0000 mg | Freq: Once | INTRAMUSCULAR | Status: DC | PRN

## 2016-10-26 MED ORDER — CHLORHEXIDINE GLUCONATE CLOTH 2 % EX PADS
6.0000 | MEDICATED_PAD | Freq: Once | CUTANEOUS | Status: AC
  Administered 2016-10-26: 6 via TOPICAL

## 2016-10-26 MED ORDER — EPHEDRINE SULFATE 50 MG/ML IJ SOLN
INTRAMUSCULAR | Status: DC | PRN
  Administered 2016-10-26: 5 mg via INTRAVENOUS

## 2016-10-26 MED ORDER — TRAMADOL HCL 50 MG PO TABS: 50.0000 mg | ORAL_TABLET | Freq: Four times a day (QID) | ORAL | 0 refills | Status: DC | PRN

## 2016-10-26 MED ORDER — FENTANYL CITRATE (PF) 100 MCG/2ML IJ SOLN
INTRAMUSCULAR | Status: DC | PRN
Start: 2016-10-26 — End: 2016-10-26
  Administered 2016-10-26: 50 ug via INTRAVENOUS

## 2016-10-26 SURGICAL SUPPLY — 37 items
BLADE SURG 15 STRL SS SAFETY (BLADE) ×3 IMPLANT
CANISTER SUCT 1200ML W/VALVE (MISCELLANEOUS) ×3 IMPLANT
CHLORAPREP W/TINT 26ML (MISCELLANEOUS) ×3 IMPLANT
CNTNR SPEC 2.5X3XGRAD LEK (MISCELLANEOUS) ×1
CONT SPEC 4OZ STER OR WHT (MISCELLANEOUS) ×2
CONTAINER SPEC 2.5X3XGRAD LEK (MISCELLANEOUS) ×1 IMPLANT
COVER LIGHT HANDLE STERIS (MISCELLANEOUS) ×3 IMPLANT
COVER PROBE FLX POLY STRL (MISCELLANEOUS) ×3 IMPLANT
DERMABOND ADVANCED (GAUZE/BANDAGES/DRESSINGS) ×2
DERMABOND ADVANCED .7 DNX12 (GAUZE/BANDAGES/DRESSINGS) ×1 IMPLANT
DEVICE DUBIN SPECIMEN MAMMOGRA (MISCELLANEOUS) ×3 IMPLANT
DEVICE LOCALIZATION ULTRAWIRE (WIRE) ×1 IMPLANT
DEVICE ULTRAWIRE LOCAL 19X9 (WIRE) IMPLANT
DRAPE LAPAROTOMY 100X77 ABD (DRAPES) ×3 IMPLANT
ELECT CAUTERY BLADE TIP 2.5 (TIP) ×3
ELECT REM PT RETURN 9FT ADLT (ELECTROSURGICAL) ×3
ELECTRODE CAUTERY BLDE TIP 2.5 (TIP) ×1 IMPLANT
ELECTRODE REM PT RTRN 9FT ADLT (ELECTROSURGICAL) ×1 IMPLANT
GLOVE BIO SURGEON STRL SZ7 (GLOVE) ×3 IMPLANT
GOWN STRL REUS W/ TWL LRG LVL3 (GOWN DISPOSABLE) ×2 IMPLANT
GOWN STRL REUS W/TWL LRG LVL3 (GOWN DISPOSABLE) ×4
KIT RM TURNOVER STRD PROC AR (KITS) ×3 IMPLANT
LABEL OR SOLS (LABEL) ×3 IMPLANT
MARGIN MAP 10MM (MISCELLANEOUS) ×6 IMPLANT
NDL SAFETY 22GX1.5 (NEEDLE) ×3 IMPLANT
NEEDLE HYPO 25X1 1.5 SAFETY (NEEDLE) ×3 IMPLANT
PACK BASIN MINOR ARMC (MISCELLANEOUS) ×3 IMPLANT
SHEARS HARMONIC 9CM CVD (BLADE) IMPLANT
SUT ETH BLK MONO 3 0 FS 1 12/B (SUTURE) ×3 IMPLANT
SUT VIC AB 2-0 CT1 (SUTURE) ×3 IMPLANT
SUT VIC AB 3-0 54X BRD REEL (SUTURE) ×1 IMPLANT
SUT VIC AB 3-0 BRD 54 (SUTURE) ×2
SUT VIC AB 4-0 FS2 27 (SUTURE) ×3 IMPLANT
SYR CONTROL 10ML (SYRINGE) ×3 IMPLANT
ULTRAWIRE LOCAL DEVICE 19X9 (WIRE)
ULTRAWIRE LOCALIZATION DEVICE (WIRE) ×3
WATER STERILE IRR 1000ML POUR (IV SOLUTION) ×3 IMPLANT

## 2016-10-26 NOTE — Anesthesia Postprocedure Evaluation (Signed)
Anesthesia Post Note  Patient: Natalie Gibbs  Procedure(s) Performed: LEFT BREAST SUBAREOLA DUCT MASS EXCISION WITH ULTRASOUND GUIDANCE (Left Breast)  Patient location during evaluation: PACU Anesthesia Type: General Level of consciousness: awake Pain management: pain level controlled Vital Signs Assessment: post-procedure vital signs reviewed and stable Respiratory status: spontaneous breathing Cardiovascular status: stable Anesthetic complications: no     Last Vitals:  Vitals:   10/26/16 1136 10/26/16 1145  BP: 129/78 127/65  Pulse: 68 62  Resp: 14 16  Temp: 36.4 C 36.8 C  SpO2: 98% 95%    Last Pain:  Vitals:   10/26/16 1145  TempSrc: Oral  PainSc:                  VAN STAVEREN,Kieron Kantner

## 2016-10-26 NOTE — Op Note (Signed)
Preop diagnosis: Watery left nipple discharge with associated mass left breast  Post op diagnosis: Same  Operation: Excision left breast subareolar mass and ducts with ultrasound guidance and wire localization  Surgeon: Mckinley Jewel  Assistant:     Anesthesia: Gen.  Complications: None  EBL: Minimal  Drains: None  Description: . Patient was put to sleep with an LMA and then the left breast was prepped and draped as sterile field. Timeout was performed. Ultrasound probe was brought up to the field and requiring the left nipple area around areolar region again showed a vague ill-defined masslike effect at the 11:00 location in the subareolar. This area was localized with a wire using the ultrasound. 25 mL of 0.5% Marcaine mixed with 1. Xylocaine was instilled around the areolar region for postop analgesia. Circumareolar incision was made along the superior aspect. The wire was freed from the skin skin was elevated towards the nipple and skin and subcutaneous tissue superiorly. Using the wire as a guide a core tissue was excised out. This portion was tagged for margins and sent to pathology. In the inspecting the ductal system beneath the nipple there appeared to be some prominent ducts with what appeared to be a small pearly pink mass suggestive of a lipoma measuring about 2-3 mm. This was also removed and sent to pathology. No further discharge was identified after removal of these ductal tissue. After ensuring hemostasis the deeper tissue closed with interrupted 2-0 Vicryl. Skin was closed with subcuticular 4-0 Vicryl covered with Dermabond. Procedure was well-tolerated and she was returned recovery room stable condition.

## 2016-10-26 NOTE — Discharge Instructions (Signed)

## 2016-10-26 NOTE — Anesthesia Procedure Notes (Signed)
Procedure Name: LMA Insertion Date/Time: 10/26/2016 9:37 AM Performed by: Justus Memory Pre-anesthesia Checklist: Patient identified, Patient being monitored, Timeout performed, Emergency Drugs available and Suction available Patient Re-evaluated:Patient Re-evaluated prior to induction Oxygen Delivery Method: Circle system utilized Preoxygenation: Pre-oxygenation with 100% oxygen Induction Type: IV induction Ventilation: Mask ventilation without difficulty LMA: LMA inserted LMA Size: 3.5 and 4.5 Tube type: Oral Number of attempts: 1 Placement Confirmation: positive ETCO2 and breath sounds checked- equal and bilateral Tube secured with: Tape Dental Injury: Teeth and Oropharynx as per pre-operative assessment

## 2016-10-26 NOTE — Transfer of Care (Signed)
Immediate Anesthesia Transfer of Care Note  Patient: Natalie Gibbs Dr[place]  Procedure(s) Performed: LEFT BREAST SUBAREOLA DUCT MASS EXCISION WITH ULTRASOUND GUIDANCE (Left Breast)  Patient Location: PACU  Anesthesia Type:General  Level of Consciousness: sedated  Airway & Oxygen Therapy: Patient Spontanous Breathing and Patient connected to face mask oxygen  Post-op Assessment: Report given to RN and Post -op Vital signs reviewed and stable  Post vital signs: Reviewed and stable  Last Vitals:  Vitals:   10/26/16 0805  BP: 135/74  Pulse: 70  Resp: 16  Temp: 36.8 C  SpO2: 96%    Last Pain:  Vitals:   10/26/16 0805  TempSrc: Tympanic         Complications: No apparent anesthesia complications

## 2016-10-26 NOTE — Anesthesia Post-op Follow-up Note (Signed)
Anesthesia QCDR form completed.        

## 2016-10-26 NOTE — Interval H&P Note (Signed)
History and Physical Interval Note:  10/26/2016 9:10 AM  Natalie Gibbs  has presented today for surgery, with the diagnosis of NIPPLE DISCHARGE  The various methods of treatment have been discussed with the patient and family. After consideration of risks, benefits and other options for treatment, the patient has consented to  Procedure(s): LEFT BREAST [place] (Left) as a surgical intervention .  The patient's history has been reviewed, patient examined, no change in status, stable for surgery.  I have reviewed the patient's chart and labs.  Questions were answered to the patient's satisfaction.     [name] G

## 2016-10-26 NOTE — Anesthesia Preprocedure Evaluation (Signed)
Anesthesia Evaluation  Patient identified by MRN, date of birth, ID band Patient awake    Reviewed: Allergy & Precautions, NPO status , Patient's Chart, lab work & pertinent test results  Airway Mallampati: III  TM Distance: >3 FB     Dental  (+) Teeth Intact   Pulmonary neg pulmonary ROS,     + decreased breath sounds      Cardiovascular Exercise Tolerance: Good hypertension, Pt. on medications  Rhythm:Regular Rate:Normal     Neuro/Psych negative neurological ROS     GI/Hepatic negative GI ROS,   Endo/Other  diabetes, Type 2  Renal/GU negative Renal ROS     Musculoskeletal   Abdominal (+) + obese,   Peds negative pediatric ROS (+)  Hematology negative hematology ROS (+)   Anesthesia Other Findings   Reproductive/Obstetrics                             Anesthesia Physical Anesthesia Plan  ASA: III  Anesthesia Plan: General   Post-op Pain Management:    Induction: Intravenous  PONV Risk Score and Plan: 1 and Ondansetron and Dexamethasone  Airway Management Planned: LMA and Oral ETT  Additional Equipment:   Intra-op Plan:   Post-operative Plan: Extubation in OR  Informed Consent: I have reviewed the patients History and Physical, chart, labs and discussed the procedure including the risks, benefits and alternatives for the proposed anesthesia with the patient or authorized representative who has indicated his/her understanding and acceptance.     Plan Discussed with: CRNA  Anesthesia Plan Comments:         Anesthesia Quick Evaluation

## 2016-10-26 NOTE — H&P (View-Only) (Signed)
[name], female   DOB: 01-Nov-1940, 76 y.o.   MRN: [id number]  Chief Complaint  Patient presents with  . Follow-up    HPI Natalie Gibbs is a 76 y.o. female here today for clear discharge from left nipple. She noticed a clear spot of discharge from her left nipple last week after hitting a wall. She states the last time she noticed this again was over a year ago. Last mammogram was 04/09/2016 cat 1. History of FCD.   HPI  Past Medical History:  Diagnosis Date  . Allergy   . Arthritis   . Diffuse cystic mastopathy   . Glaucoma   . Obesity, unspecified   . Special screening for malignant neoplasms, colon   . Type II diabetes mellitus with renal manifestations ([place]) 05/15/2014    Past Surgical History:  Procedure Laterality Date  . BREAST BIOPSY Left 20 + yrs ago   benign  . BREAST CYST ASPIRATION Left 2015   neg  . CATARACT EXTRACTION, BILATERAL Bilateral 2017  . COLONOSCOPY  2010   Dr. Candace Cruise, [facility]  . COLONOSCOPY N/A 05/13/2014   Procedure: COLONOSCOPY;  Surgeon: Hulen Luster, MD;  Location: [facility] ENDOSCOPY;  Service: Gastroenterology;  Laterality: N/A;  . excision mass abdominal wall  1997  . TUBAL LIGATION      Family History  Problem Relation Age of Onset  . Breast cancer Other        niece/breast  . Diabetes Sister     Social History Social History  Substance Use Topics  . Smoking status: Never Smoker  . Smokeless tobacco: Never Used  . Alcohol use No    Allergies  Allergen Reactions  . Iodine Itching  . Shellfish Allergy Itching  . Codeine Rash    Current Outpatient Prescriptions  Medication Sig Dispense Refill  . aspirin 81 MG tablet Take 81 mg by mouth daily.    . brimonidine (ALPHAGAN) 0.2 % ophthalmic solution 3 (three) times daily.    . dorzolamide (TRUSOPT) 2 % ophthalmic solution Place 1 drop into both eyes 2 (two) times daily.     [name] latanoprost (XALATAN) 0.005 % ophthalmic solution 1 drop at bedtime.    [name] losartan  (COZAAR) 25 MG tablet Take 1 tablet (25 mg total) by mouth daily. 90 tablet 3  . POTASSIUM CHLORIDE ER PO Take by mouth daily.     No current facility-administered medications for this visit.     Review of Systems Review of Systems  Constitutional: Negative.   Respiratory: Negative.   Cardiovascular: Negative.     Blood pressure 124/74, pulse 78, resp. rate 12, height 5' (1.524 m), weight 188 lb (85.3 kg).  Physical Exam Physical Exam  Constitutional: She is oriented to person, place, and time. She appears well-developed and well-nourished.  Eyes: Conjunctivae are normal. No scleral icterus.  Neck: Neck supple.  Cardiovascular: Normal rate, regular rhythm and normal heart sounds.   Pulmonary/Chest: Effort normal and breath sounds normal. No respiratory distress. Right breast exhibits nipple discharge ( milky discharge with manipulation). Right breast exhibits no inverted nipple, no mass, no skin change and no tenderness. Left breast exhibits nipple discharge (watery discharge from upper outer nipple with manipulation). Left breast exhibits no inverted nipple, no mass, no skin change and no tenderness.  Abdominal: Soft. Bowel sounds are normal. There is no tenderness.  Lymphadenopathy:    She has no cervical adenopathy.    She has no axillary adenopathy.  Neurological: She  is alert and oriented to person, place, and time.  Skin: Skin is warm and dry.  Psychiatric: She has a normal mood and affect. Her behavior is normal.    Data Reviewed Prior notes and mammogram from April 2018 reviewed [place] of left breast in areolar region showed 2 irregularly shaped hypoechoic masses in the superior portion of the areola. On physical exam pressure over this site elicits watery drainage.  Mammogram on 04/09/16 showed no evidence of malignancy. 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 Physical exam today showed milky discharge from right nipple and watery discharge from left upper outer nipple-this is benign. Watery discharge from left nipple on pressure from superior aspect.    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 Recommended excision and pt is agreeable. Will schedule for left breast mass excision procedure in the near future.     HPI, Physical Exam, Assessment and Plan have been scribed under the direction and in the presence of Mckinley Jewel, MD  Gaspar Cola, CMA  I have completed the exam and reviewed the above documentation for accuracy and completeness.  I agree with the above.  Haematologist has been used and any errors in dictation or transcription are unintentional.  Seeplaputhur G. Jamal Collin, M.D., F.A.C.S.   Junie Panning G 10/11/2016, 1:26 PM   Patient's surgery has been scheduled for 10-26-16 at [facility]. It is okay for patient to continue an 81 mg aspirin once daily.    Dominga Ferry, CMA

## 2016-10-27 ENCOUNTER — Encounter: Payer: Self-pay | Admitting: Internal Medicine

## 2016-10-27 ENCOUNTER — Ambulatory Visit (INDEPENDENT_AMBULATORY_CARE_PROVIDER_SITE_OTHER): Payer: Medicare HMO | Admitting: *Deleted

## 2016-10-27 DIAGNOSIS — D249 Benign neoplasm of unspecified breast: Secondary | ICD-10-CM | POA: Insufficient documentation

## 2016-10-27 DIAGNOSIS — N6452 Nipple discharge: Secondary | ICD-10-CM

## 2016-10-27 LAB — SURGICAL PATHOLOGY

## 2016-10-27 NOTE — Patient Instructions (Signed)
Return as scheduled 

## 2016-10-27 NOTE — Progress Notes (Signed)
Patient came in today for a wound check left breast bleeding below nipple. Applied two  2 by 2 and a Tegaderm .  Ice pack given to patient.The wound is clean, with no signs of infection noted. Follow up as scheduled.

## 2016-10-28 ENCOUNTER — Telehealth: Payer: Self-pay | Admitting: *Deleted

## 2016-10-28 NOTE — Telephone Encounter (Signed)
Notified patient as instructed, patient pleased. Discussed follow-up appointments, patient agrees No further bleeding at site.

## 2016-10-28 NOTE — Telephone Encounter (Signed)
-----   Message from Christene Lye, MD sent at 10/28/2016  9:11 AM EDT ----- Please inform pt- pathology benign- papilloma as suspected.

## 2016-11-02 ENCOUNTER — Encounter: Payer: Self-pay | Admitting: General Surgery

## 2016-11-02 ENCOUNTER — Ambulatory Visit (INDEPENDENT_AMBULATORY_CARE_PROVIDER_SITE_OTHER): Payer: Medicare HMO | Admitting: General Surgery

## 2016-11-02 VITALS — BP 118/68 | HR 72 | Resp 14 | Ht 64.0 in | Wt 189.0 lb

## 2016-11-02 DIAGNOSIS — R221 Localized swelling, mass and lump, neck: Secondary | ICD-10-CM

## 2016-11-02 DIAGNOSIS — D242 Benign neoplasm of left breast: Secondary | ICD-10-CM

## 2016-11-02 NOTE — Progress Notes (Signed)
[name], female   DOB: 1940-05-07, 76 y.o.   MRN: [id number]  Chief Complaint  Patient presents with  . Routine Post Op    HPI Natalie Gibbs is a 76 y.o. female here today for her post op left breast duct mass excision done on 10/26/2016. Patient states she is doing well today.  Patient states she felt a knot under her chin last week before surgery, it has not been there previously. No pain, erythema, or difficulty swallowing.                           Natalie KitchenHPI  Past Medical History:  Diagnosis Date  . Allergy   . Arthritis   . Diffuse cystic mastopathy   . Glaucoma   . Hypertension   . Obesity, unspecified   . Pre-diabetes   . Special screening for malignant neoplasms, colon     Past Surgical History:  Procedure Laterality Date  . BREAST BIOPSY Left 20 + yrs ago   benign  . BREAST CYST ASPIRATION Left 2015   neg  . BREAST DUCTAL SYSTEM EXCISION Left 10/26/2016   Procedure: LEFT BREAST SUBAREOLA DUCT MASS EXCISION WITH ULTRASOUND GUIDANCE;  Surgeon: Christene Lye, MD;  Location: [facility] ORS;  Service: General;  Laterality: Left;  . CATARACT EXTRACTION, BILATERAL Bilateral 2017  . COLONOSCOPY  2010   Dr. Candace Cruise, [facility]  . COLONOSCOPY N/A 05/13/2014   Procedure: COLONOSCOPY;  Surgeon: Hulen Luster, MD;  Location: [facility] ENDOSCOPY;  Service: Gastroenterology;  Laterality: N/A;  . excision mass abdominal wall  1997  . EYE SURGERY  2017   bilateral  . TUBAL LIGATION      Family History  Problem Relation Age of Onset  . Breast cancer Other        niece/breast  . Diabetes Sister     Social History Social History  Substance Use Topics  . Smoking status: Never Smoker  . Smokeless tobacco: Never Used  . Alcohol use No    Allergies  Allergen Reactions  . Iodine Itching  . Shellfish Allergy Itching  . Codeine Rash    Current Outpatient Prescriptions  Medication Sig Dispense Refill  . aspirin 81 MG tablet Take 81 mg by mouth daily.    .  brimonidine (ALPHAGAN) 0.2 % ophthalmic solution Place 1 drop into both eyes 2 (two) times daily.     . dorzolamide-timolol (COSOPT) 22.3-6.8 MG/ML ophthalmic solution Place 1 drop into both eyes 2 (two) times daily.    Natalie Gibbs ibuprofen (ADVIL,MOTRIN) 200 MG tablet Take 200 mg by mouth every 8 (eight) hours as needed for mild pain.    Natalie Gibbs latanoprost (XALATAN) 0.005 % ophthalmic solution Place 1 drop into both eyes at bedtime.     Natalie Gibbs losartan (COZAAR) 25 MG tablet Take 1 tablet (25 mg total) by mouth daily. (Patient taking differently: Take 25 mg by mouth daily. In am.) 90 tablet 3  . traMADol (ULTRAM) 50 MG tablet Take 1 tablet (50 mg total) by mouth every 6 (six) hours as needed. 10 tablet 0   No current facility-administered medications for this visit.     Review of Systems Review of Systems  Constitutional: Negative.   Respiratory: Negative.   Cardiovascular: Negative.     Blood pressure 118/68, pulse 72, resp. rate 14, height 5\' 4"  (1.626 m), weight 189 lb (85.7 kg).  Physical Exam Physical Exam  Constitutional: She is oriented to person,  place, and time. She appears well-developed and well-nourished.  Neck: Neck supple.  1.5 cm firm, mobile, submental mass slightly to the left of midline.     Pulmonary/Chest:  Left breast incision site is clean and healing well.   Lymphadenopathy:    She has no cervical adenopathy.  Neurological: She is alert and oriented to person, place, and time.  Skin: Skin is warm and dry.    Data Reviewed Op notes reviewed, surgical pathology.  Surgical Pathology: papilloma   Assessment    Left breast incision site is clean     Submental mass- needs further eval. Refer to ENT. Pt advised ans is agreeable.  Plan    Patient to return in one month. The patient is aware to call back for any questions or concerns.   Ref patient to ENT office.     HPI, Physical Exam, Assessment and Plan have been scribed under the direction and in the presence of Mckinley Jewel, MD  Gaspar Cola, CMA  I have completed the exam and reviewed the above documentation for accuracy and completeness.  I agree with the above.  Haematologist has been used and any errors in dictation or transcription are unintentional.  Ngai Parcell G. Jamal Collin, M.D., F.A.C.S.   Junie Panning G 11/02/2016, 1:40 PM

## 2016-11-02 NOTE — Patient Instructions (Signed)
Return in one month.  

## 2016-11-08 ENCOUNTER — Other Ambulatory Visit: Payer: Self-pay | Admitting: Internal Medicine

## 2016-11-08 DIAGNOSIS — E1121 Type 2 diabetes mellitus with diabetic nephropathy: Secondary | ICD-10-CM

## 2016-11-10 DIAGNOSIS — H402231 Chronic angle-closure glaucoma, bilateral, mild stage: Secondary | ICD-10-CM | POA: Diagnosis not present

## 2016-11-19 DIAGNOSIS — L040 Acute lymphadenitis of face, head and neck: Secondary | ICD-10-CM | POA: Diagnosis not present

## 2016-12-02 ENCOUNTER — Ambulatory Visit: Payer: Medicare HMO | Admitting: General Surgery

## 2016-12-08 ENCOUNTER — Encounter: Payer: Self-pay | Admitting: General Surgery

## 2016-12-08 ENCOUNTER — Ambulatory Visit (INDEPENDENT_AMBULATORY_CARE_PROVIDER_SITE_OTHER): Payer: Medicare HMO | Admitting: General Surgery

## 2016-12-08 VITALS — BP 120/78 | HR 82 | Resp 13 | Ht 64.0 in | Wt 189.0 lb

## 2016-12-08 DIAGNOSIS — D242 Benign neoplasm of left breast: Secondary | ICD-10-CM

## 2016-12-08 NOTE — Patient Instructions (Signed)
Patient will be asked to return to the office in 4 months with Dr Bary Castilla and with a bilateral screening mammogram.

## 2016-12-08 NOTE — Progress Notes (Signed)
[name], female   DOB: 02/03/1940, 76 y.o.   MRN: [id number]  Chief Complaint  Patient presents with  . Routine Post Op    HPI Natalie Gibbs is a 76 y.o. female here today for her follow up left breast excision done on 12/02/2016. She states it is still sore around her nipple area.  No further discharge noted  HPI  Past Medical History:  Diagnosis Date  . Allergy   . Arthritis   . Diffuse cystic mastopathy   . Glaucoma   . Hypertension   . Obesity, unspecified   . Pre-diabetes   . Special screening for malignant neoplasms, colon     Past Surgical History:  Procedure Laterality Date  . BREAST BIOPSY Left 20 + yrs ago   benign  . BREAST CYST ASPIRATION Left 2015   neg  . BREAST DUCTAL SYSTEM EXCISION Left 10/26/2016   Procedure: LEFT BREAST SUBAREOLA DUCT MASS EXCISION WITH ULTRASOUND GUIDANCE;  Surgeon: Christene Lye, MD;  Location: [facility] ORS;  Service: General;  Laterality: Left;  . CATARACT EXTRACTION, BILATERAL Bilateral 2017  . COLONOSCOPY  2010   Dr. Candace Cruise, [facility]  . COLONOSCOPY N/A 05/13/2014   Procedure: COLONOSCOPY;  Surgeon: Hulen Luster, MD;  Location: [facility] ENDOSCOPY;  Service: Gastroenterology;  Laterality: N/A;  . excision mass abdominal wall  1997  . EYE SURGERY  2017   bilateral  . TUBAL LIGATION      Family History  Problem Relation Age of Onset  . Breast cancer Other        niece/breast  . Diabetes Sister     Social History Social History   Tobacco Use  . Smoking status: Never Smoker  . Smokeless tobacco: Never Used  Substance Use Topics  . Alcohol use: No    Alcohol/week: 0.0 oz  . Drug use: No    Allergies  Allergen Reactions  . Iodine Itching  . Shellfish Allergy Itching  . Codeine Rash    Current Outpatient Medications  Medication Sig Dispense Refill  . aspirin 81 MG tablet Take 81 mg by mouth daily.    . brimonidine (ALPHAGAN) 0.2 % ophthalmic solution Place 1 drop into both eyes 2 (two)  times daily.     . dorzolamide-timolol (COSOPT) 22.3-6.8 MG/ML ophthalmic solution Place 1 drop into both eyes 2 (two) times daily.    [name] latanoprost (XALATAN) 0.005 % ophthalmic solution Place 1 drop into both eyes at bedtime.     [name] losartan (COZAAR) 25 MG tablet TAKE ONE TABLET BY MOUTH ONCE DAILY 90 tablet 1   No current facility-administered medications for this visit.     Review of Systems Review of Systems  Constitutional: Negative.   Respiratory: Negative.   Cardiovascular: Negative.     Blood pressure 120/78, pulse 82, resp. rate 13, height 5\' 4"  (1.626 m), weight 189 lb (85.7 kg).  Physical Exam Physical Exam  Constitutional: She is oriented to person, place, and time. She appears well-developed and well-nourished.  Eyes: Conjunctivae are normal. No scleral icterus.  Neck: Neck supple.  Cardiovascular: Normal rate, regular rhythm and normal heart sounds.  Pulmonary/Chest: Effort normal and breath sounds normal. Right breast exhibits no inverted nipple, no mass, no nipple discharge, no skin change and no tenderness. Left breast exhibits no inverted nipple, no mass, no nipple discharge, no skin change and no tenderness.  Incision clean left breast  Lymphadenopathy:    She has no cervical adenopathy.  She has no axillary adenopathy.  Neurological: She is alert and oriented to person, place, and time.  Skin: Skin is warm and dry.  Psychiatric: Her behavior is normal.    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      HPI, Physical Exam, Assessment and Plan have been scribed under the direction and in the presence of Mckinley Jewel, MD  Gaspar Cola, CMA I have completed the  exam and reviewed the above documentation for accuracy and completeness.  I agree with the above.  Haematologist has been used and any errors in dictation or transcription are unintentional.  Novah Nessel G. Jamal Collin, M.D., F.A.C.S.   Junie Panning G 12/17/2016, 10:44 AM

## 2016-12-31 ENCOUNTER — Other Ambulatory Visit: Payer: Self-pay | Admitting: *Deleted

## 2016-12-31 ENCOUNTER — Other Ambulatory Visit: Payer: Self-pay | Admitting: Otolaryngology

## 2016-12-31 DIAGNOSIS — R221 Localized swelling, mass and lump, neck: Secondary | ICD-10-CM

## 2016-12-31 DIAGNOSIS — L040 Acute lymphadenitis of face, head and neck: Secondary | ICD-10-CM | POA: Diagnosis not present

## 2017-01-06 ENCOUNTER — Ambulatory Visit: Payer: Medicare HMO

## 2017-01-21 ENCOUNTER — Ambulatory Visit
Admission: RE | Admit: 2017-01-21 | Discharge: 2017-01-21 | Disposition: A | Discharge status: Home / Self Care | Payer: Medicare HMO | Source: Ambulatory Visit | Attending: Otolaryngology | Admitting: Otolaryngology

## 2017-01-21 DIAGNOSIS — R221 Localized swelling, mass and lump, neck: Secondary | ICD-10-CM | POA: Diagnosis present

## 2017-03-03 ENCOUNTER — Other Ambulatory Visit: Payer: Self-pay | Admitting: Internal Medicine

## 2017-03-03 MED ORDER — ACCU-CHEK AVIVA DEVI: 0 refills | Status: DC

## 2017-03-03 MED ORDER — ACCU-CHEK SOFTCLIX LANCETS MISC: 1.0000 | Freq: Every day | 3 refills | Status: DC

## 2017-03-03 MED ORDER — GLUCOSE BLOOD VI STRP: ORAL_STRIP | 3 refills | Status: DC

## 2017-03-07 ENCOUNTER — Other Ambulatory Visit: Payer: Self-pay

## 2017-03-07 DIAGNOSIS — Z1231 Encounter for screening mammogram for malignant neoplasm of breast: Secondary | ICD-10-CM

## 2017-03-23 ENCOUNTER — Ambulatory Visit (INDEPENDENT_AMBULATORY_CARE_PROVIDER_SITE_OTHER): Payer: Medicare HMO

## 2017-03-23 VITALS — BP 112/62 | HR 62 | Temp 98.5°F | Resp 12 | Ht 64.0 in | Wt 182.4 lb

## 2017-03-23 DIAGNOSIS — Z0000 Encounter for general adult medical examination without abnormal findings: Secondary | ICD-10-CM

## 2017-03-23 DIAGNOSIS — Z23 Encounter for immunization: Secondary | ICD-10-CM | POA: Diagnosis not present

## 2017-03-23 NOTE — Patient Instructions (Signed)
Natalie Gibbs , Thank you for taking time to come for your Medicare Wellness Visit (77) I appreciate your ongoing commitment to your health goals. Please review the following plan we discussed and let me know if I can assist you in the future.   Screening recommendations/referrals: Colorectal Screening: Completed colonoscopy 05/15/14. Repeat every 3 years. Mammogram: Completed 04/09/16. Repeat every year. Bone Density: Completed 02/04/10. Osteoporotic screenings no longer required  Vision/Dental/Diabetic Exams: Diabetic Exams: Recommend annual diabetic eye exams for retinopathy and diabetic foot exams.  Diabetic Eye Exam: Please schedule an appointment with your ophthalmologist Diabetic Foot Exam: Please keep your appointment with Dr. Army Melia on 04/01/17 Recommended yearly ophthalmology/optometry visit for glaucoma screening and checkup Recommended yearly dental visit for hygiene and checkup  Vaccinations: Influenza vaccine: Given today Pneumococcal vaccine: Completed series Tdap vaccine: Up to date Shingles vaccine: Please call your insurance company to determine your out of pocket expense for the Shingrix vaccine. You may also receive this vaccine at your local pharmacy or Health Dept.    Advanced directives: Please bring a copy of your POA (Power of Attorney) and/or Living Will to your next appointment.  Conditions/risks identified: Recommend to drink at least 6-8 8oz glasses of water per day.  Next appointment: You are scheduled to see Dr. Army Melia on 04/01/17 @ 8:30am.   Please schedule your Annual Wellness Visit with your Nurse Health Advisor in one year.  Preventive Care 28 Years and Older, Female Preventive care refers to lifestyle choices and visits with your health care provider that can promote health and wellness. What does preventive care include?  A yearly physical exam. This is also called an annual well check.  Dental exams once or twice a year.  Routine eye exams. Ask  your health care provider how often you should have your eyes checked.  Personal lifestyle choices, including:  Daily care of your teeth and gums.  Regular physical activity.  Eating a healthy diet.  Avoiding tobacco and drug use.  Limiting alcohol use.  Practicing safe sex.  Taking low-dose aspirin every day.  Taking vitamin and mineral supplements as recommended by your health care provider. What happens during an annual well check? The services and screenings done by your health care provider during your annual well check will depend on your age, overall health, lifestyle risk factors, and family history of disease. Counseling  Your health care provider may ask you questions about your:  Alcohol use.  Tobacco use.  Drug use.  Emotional well-being.  Home and relationship well-being.  Sexual activity.  Eating habits.  History of falls.  Memory and ability to understand (cognition).  Work and work Statistician.  Reproductive health. Screening  You may have the following tests or measurements:  Height, weight, and BMI.  Blood pressure.  Lipid and cholesterol levels. These may be checked every 5 years, or more frequently if you are over 28 years old.  Skin check.  Lung cancer screening. You may have this screening every year starting at age 30 if you have a 30-pack-year history of smoking and currently smoke or have quit within the past 15 years.  Fecal occult blood test (FOBT) of the stool. You may have this test every year starting at age 21.  Flexible sigmoidoscopy or colonoscopy. You may have a sigmoidoscopy every 5 years or a colonoscopy every 10 years starting at age 68.  Hepatitis C blood test.  Hepatitis B blood test.  Sexually transmitted disease (STD) testing.  Diabetes screening. This is done by  checking your blood sugar (glucose) after you have not eaten for a while (fasting). You may have this done every 1-3 years.  Bone density scan.  This is done to screen for osteoporosis. You may have this done starting at age 58.  Mammogram. This may be done every 1-2 years. Talk to your health care provider about how often you should have regular mammograms. Talk with your health care provider about your test results, treatment options, and if necessary, the need for more tests. Vaccines  Your health care provider may recommend certain vaccines, such as:  Influenza vaccine. This is recommended every year.  Tetanus, diphtheria, and acellular pertussis (Tdap, Td) vaccine. You may need a Td booster every 10 years.  Zoster vaccine. You may need this after age 78.  Pneumococcal 13-valent conjugate (PCV13) vaccine. One dose is recommended after age 17.  Pneumococcal polysaccharide (PPSV23) vaccine. One dose is recommended after age 43. Talk to your health care provider about which screenings and vaccines you need and how often you need them. This information is not intended to replace advice given to you by your health care provider. Make sure you discuss any questions you have with your health care provider. Document Released: 01/17/2015 Document Revised: 09/10/2015 Document Reviewed: 10/22/2014 Elsevier Interactive Patient Education  2017 [place] Prevention in the Home Falls can cause injuries. They can happen to people of all ages. There are many things you can do to make your home safe and to help prevent falls. What can I do on the outside of my home?  Regularly fix the edges of walkways and driveways and fix any cracks.  Remove anything that might make you trip as you walk through a door, such as a raised step or threshold.  Trim any bushes or trees on the path to your home.  Use bright outdoor lighting.  Clear any walking paths of anything that might make someone trip, such as rocks or tools.  Regularly check to see if handrails are loose or broken. Make sure that both sides of any steps have handrails.  Any  raised decks and porches should have guardrails on the edges.  Have any leaves, snow, or ice cleared regularly.  Use sand or salt on walking paths during winter.  Clean up any spills in your garage right away. This includes oil or grease spills. What can I do in the bathroom?  Use night lights.  Install grab bars by the toilet and in the tub and shower. Do not use towel bars as grab bars.  Use non-skid mats or decals in the tub or shower.  If you need to sit down in the shower, use a plastic, non-slip stool.  Keep the floor dry. Clean up any water that spills on the floor as soon as it happens.  Remove soap buildup in the tub or shower regularly.  Attach bath mats securely with double-sided non-slip rug tape.  Do not have throw rugs and other things on the floor that can make you trip. What can I do in the bedroom?  Use night lights.  Make sure that you have a light by your bed that is easy to reach.  Do not use any sheets or blankets that are too big for your bed. They should not hang down onto the floor.  Have a firm chair that has side arms. You can use this for support while you get dressed.  Do not have throw rugs and other things on the  floor that can make you trip. What can I do in the kitchen?  Clean up any spills right away.  Avoid walking on wet floors.  Keep items that you use a lot in easy-to-reach places.  If you need to reach something above you, use a strong step stool that has a grab bar.  Keep electrical cords out of the way.  Do not use floor polish or wax that makes floors slippery. If you must use wax, use non-skid floor wax.  Do not have throw rugs and other things on the floor that can make you trip. What can I do with my stairs?  Do not leave any items on the stairs.  Make sure that there are handrails on both sides of the stairs and use them. Fix handrails that are broken or loose. Make sure that handrails are as long as the  stairways.  Check any carpeting to make sure that it is firmly attached to the stairs. Fix any carpet that is loose or worn.  Avoid having throw rugs at the top or bottom of the stairs. If you do have throw rugs, attach them to the floor with carpet tape.  Make sure that you have a light switch at the top of the stairs and the bottom of the stairs. If you do not have them, ask someone to add them for you. What else can I do to help prevent falls?  Wear shoes that:  Do not have high heels.  Have rubber bottoms.  Are comfortable and fit you well.  Are closed at the toe. Do not wear sandals.  If you use a stepladder:  Make sure that it is fully opened. Do not climb a closed stepladder.  Make sure that both sides of the stepladder are locked into place.  Ask someone to hold it for you, if possible.  Clearly mark and make sure that you can see:  Any grab bars or handrails.  First and last steps.  Where the edge of each step is.  Use tools that help you move around (mobility aids) if they are needed. These include:  Canes.  Walkers.  Scooters.  Crutches.  Turn on the lights when you go into a dark area. Replace any light bulbs as soon as they burn out.  Set up your furniture so you have a clear path. Avoid moving your furniture around.  If any of your floors are uneven, fix them.  If there are any pets around you, be aware of where they are.  Review your medicines with your doctor. Some medicines can make you feel dizzy. This can increase your chance of falling. Ask your doctor what other things that you can do to help prevent falls. This information is not intended to replace advice given to you by your health care provider. Make sure you discuss any questions you have with your health care provider. Document Released: 10/17/2008 Document Revised: 05/29/2015 Document Reviewed: 01/25/2014 Elsevier Interactive Patient Education  2017 [employer].

## 2017-03-23 NOTE — Progress Notes (Signed)
Subjective:   Natalie Gibbs is a 77 y.o. female who presents for Medicare Annual (Subsequent) preventive examination.  Review of Systems:  N/A Cardiac Risk Factors include: advanced age (>103men, >28 women);diabetes mellitus;obesity (BMI >30kg/m2);hypertension     Objective:     Vitals: BP 112/62 (BP Location: Right Arm, Patient Position: Sitting, Cuff Size: Large)   Pulse 62   Temp 98.5 F (36.9 C) (Oral)   Resp 12   Ht 5\' 4"  (1.626 m)   Wt 182 lb 6.4 oz (82.7 kg)   SpO2 92%   BMI 31.31 kg/m   Body mass index is 31.31 kg/m.  Advanced Directives 03/23/2017 10/26/2016 10/19/2016 03/10/2015 02/04/2015 05/13/2014  Does Patient Have a Medical Advance Directive? Yes Yes Yes Yes Yes Yes  Type of Paramedic of [place];Living will Healthcare Power of [place] of [place] of [place] of [place] will  Does patient want to make changes to medical advance directive? - No - Patient declined No - Patient declined - - -  Copy of [place] in Chart? No - copy requested No - copy requested No - copy requested - - No - copy requested    Tobacco Social History   Tobacco Use  Smoking Status Never Smoker  Smokeless Tobacco Never Used  Tobacco Comment   smoking cessation materials not required     Counseling given: No Comment: smoking cessation materials not required   Clinical Intake:  Pre-visit preparation completed: Yes  Pain : No/denies pain     BMI - recorded: 31.31 Nutritional Status: BMI > 30  Obese Nutritional Risks: None Diabetes: Yes CBG done?: No Did pt. bring in CBG monitor from home?: No  How often do you need to have someone help you when you read instructions, pamphlets, or other written materials from your doctor or pharmacy?: 1 - Never  Interpreter Needed?: No  Information entered by :: AEversole, LPN  Past Medical History:  Diagnosis Date  .  Allergy   . Arthritis   . Diffuse cystic mastopathy   . Glaucoma   . Hypertension   . Obesity, unspecified   . Pre-diabetes   . Special screening for malignant neoplasms, colon    Past Surgical History:  Procedure Laterality Date  . BREAST BIOPSY Left 20 + yrs ago   benign  . BREAST CYST ASPIRATION Left 2015   neg  . BREAST DUCTAL SYSTEM EXCISION Left 10/26/2016   Procedure: LEFT BREAST SUBAREOLA DUCT MASS EXCISION WITH ULTRASOUND GUIDANCE;  Surgeon: Christene Lye, MD;  Location: [facility] ORS;  Service: General;  Laterality: Left;  . CATARACT EXTRACTION, BILATERAL Bilateral 2017  . COLONOSCOPY  2010   Dr. Candace Cruise, [facility]  . COLONOSCOPY N/A 05/13/2014   Procedure: COLONOSCOPY;  Surgeon: Hulen Luster, MD;  [facility] END[facility]COPY;  Service: Gastroenterology;  Laterality: N/A;  . excision mass abdominal wall  1997  . EYE SURGERY  2017   bilateral  . TUBAL LIGATION     Family History  Problem Relation Age of Onset  . Healthy Mother   . Emphysema Father   . Breast cancer Other        niece/breast  . Diabetes Sister   . Diabetes Sister    Social History   Socioeconomic History  . Marital status: Married    Spouse name: None  . Number of children: 4  . Years of education: None  . Highest education level: 10th grade  Social Needs  . Financial resource strain: Not hard at all  . Food insecurity - worry: Never true  . Food insecurity - inability: Never true  . Transportation needs - medical: No  . Transportation needs - non-medical: No  Occupational History  . Occupation: Retired    Fish farm manager: OTHER  Tobacco Use  . Smoking status: Never Smoker  . Smokeless tobacco: Never Used  . Tobacco comment: smoking cessation materials not required  Substance and Sexual Activity  . Alcohol use: No    Alcohol/week: 0.0 oz  . Drug use: No  . Sexual activity: No  Other Topics Concern  . None  Social History Narrative  . None    Outpatient Encounter Medications as of 03/23/2017    Medication Sig  . ACCU-CHEK SOFTCLIX LANCETS lancets 1 each by Other route daily. Use as instructed  . aspirin 81 MG tablet Take 81 mg by mouth daily.  . Blood Glucose Monitoring Suppl (ACCU-CHEK AVIVA) device Use as instructed  . brimonidine (ALPHAGAN) 0.2 % ophthalmic solution Place 1 drop into both eyes 2 (two) times daily.   . dorzolamide-timolol (COSOPT) 22.3-6.8 MG/ML ophthalmic solution Place 1 drop into both eyes 2 (two) times daily.  [name] glucose blood (ACCU-CHEK AVIVA) test strip Test blood sugar once daily  . latanoprost (XALATAN) 0.005 % ophthalmic solution Place 1 drop into both eyes at bedtime.   [name] losartan (COZAAR) 25 MG tablet TAKE ONE TABLET BY MOUTH ONCE DAILY   No facility-administered encounter medications on file as of 03/23/2017.     Activities of Daily Living In your present state of health, do you have any difficulty performing the following activities: 03/23/2017 10/19/2016  Hearing? N N  Comment denies hearing aids ringing in my right ear at night time  Vision? N N  Comment wears reading glasses readers  Difficulty concentrating or making decisions? N N  Walking or climbing stairs? Y N  Comment pain in back -  Dressing or bathing? N N  Doing errands, shopping? N N  Preparing Food and eating ? N -  Comment full upper dentures -  Using the Toilet? N -  In the past six months, have you accidently leaked urine? N -  Do you have problems with loss of bowel control? N -  Managing your Medications? N -  Managing your Finances? N -  Housekeeping or managing your Housekeeping? N -  Some recent data might be hidden    Patient Care Team: Glean Hess, MD as PCP - General (Family Medicine) Bary Castilla Forest Gleason, MD as Consulting Physician (General Surgery)    Assessment:   This is a routine wellness examination for Tannia Contino.  Exercise Activities and Dietary recommendations Current Exercise Habits: Home exercise routine, Type of exercise: walking, Time (Minutes):  20, Frequency (Times/Week): 2, Weekly Exercise (Minutes/Week): 40, Intensity: Mild, Exercise limited by: None identified  Goals    . DIET - INCREASE WATER INTAKE     Recommend to drink at least 6-8 8oz glasses of water per day.    . Increase physical activity       Fall Risk Fall Risk  03/23/2017 03/31/2016 02/04/2015  Falls in the past year? No No No  Risk for fall due to : Impaired vision - -  Risk for fall due to: Comment wears reading glasses - -   Is the patient's home free of loose throw rugs in walkways, pet beds, electrical cords, etc?   Yes Does the patient have any grab  bars in the bathroom? No  Does the patient use a shower chair when bathing? Yes Does the patient have any stairs in or around the home? No If so, are there any handrails?  N/A Does the patient have adequate lighting?  Yes Does the patient use a cane, walker or w/c? No Does the patient use of an elevated toilet seat? No  Timed Get Up and Go Performed: Yes. Pt ambulated 10 feet within 13 sec. Gait slow, steady and without the use of an assistive device. No intervention required at this time. Fall risk prevention has been discussed.  Pt declined my offer to send Community Resource Referral to Care Guide for  installation of grab bars in the shower or an elevated toilet seat.  Depression Screen PHQ 2/9 Scores 03/23/2017 03/31/2016 02/04/2015  PHQ - 2 Score 0 0 0  PHQ- 9 Score 0 - -     Cognitive Function     6CIT Screen 03/23/2017 03/31/2016  What Year? 0 points 0 points  What month? 0 points 0 points  What time? 3 points 0 points  Count back from 20 0 points 0 points  Months in reverse 0 points 0 points  Repeat phrase 8 points 2 points  Total Score 11 2    Immunization History  Administered Date(s) Administered  . Influenza,inj,Quad PF,6+ Mos 09/10/2015, 03/23/2017  . Pneumococcal Conjugate-13 03/06/2014  . Pneumococcal Polysaccharide-23 03/06/2008  . Tdap 03/10/2015    Qualifies for Shingles  Vaccine? Yes. Due for Zostavax or Shingrix vaccine. Education has been provided regarding the importance of this vaccine. Pt has been advised to call her insurance company to determine her out of pocket expense. Advised she may also receive this vaccine at her local pharmacy or Health Dept. Verbalized acceptance and understanding.  Screening Tests Health Maintenance  Topic Date Due  . OPHTHALMOLOGY EXAM  03/24/2017  . FOOT EXAM  03/31/2017  . HEMOGLOBIN A1C  03/31/2017  . MAMMOGRAM  04/09/2017  . COLONOSCOPY  05/14/2017  . TETANUS/TDAP  03/09/2025  . INFLUENZA VACCINE  Completed  . DEXA SCAN  Completed  . PNA vac Low Risk Adult  Completed    Cancer Screenings: Lung: Low Dose CT Chest recommended if Age 30-80 years, 30 pack-year currently smoking OR have quit w/in 15years. Patient does not qualify. Breast:  Up to date on Mammogram? Yes. Completed 04/09/16. Repeat every year.   Up to date of Bone Density/Dexa? Yes. Completed 02/04/10. Osteoporotic screenings no longer required Colorectal: Completed colonoscopy 05/15/14. Repeat every 3 years.  Additional Screenings: Hepatitis B/HIV/Syphillis: Does not qualify Hepatitis C Screening: Does not qualify  Diabetic Exams: Diabetic Eye Exam: Completed 03/24/16. Advised to schedule an appt with her ophthalmologist for completion. Diabetic Foot Exam: Completed 03/31/16. Scheduled for an appt with Dr. Army Melia on 04/01/17    Plan:  I have personally reviewed and addressed the Medicare Annual Wellness questionnaire and have noted the following in the patient's chart:  A. Medical and social history B. Use of alcohol, tobacco or illicit drugs  C. Current medications and supplements D. Functional ability and status E.  Nutritional status F.  Physical activity G. Advance directives H. List of other physicians I.  Hospitalizations, surgeries, and ER visits in previous 12 months J.  [place] such as hearing and vision if needed, cognitive  and depression L. Referrals and appointments - none  In addition, I have reviewed and discussed with patient certain preventive protocols, quality metrics, and best practice recommendations. A written  personalized care plan for preventive services as well as general preventive health recommendations were provided to patient.  Signed,  Aleatha Borer, LPN Nurse Health Advisor  MD Recommendations: Due for Zostavax or Shingrix vaccine. Education has been provided regarding the importance of this vaccine. Pt has been advised to call her insurance company to determine her out of pocket expense. Advised she may also receive this vaccine at her local pharmacy or Health Dept. Verbalized acceptance and understanding.  Will be due for Diabetic Eye Exam. Last completed 03/24/16. Advised to schedule an appt with her ophthalmologist for completion.  Will be due for Diabetic Foot Exam. Last completed 03/31/16. Scheduled for an appt with Dr. Army Melia on 04/01/17  Will be due for repeat Hgb A1C on or after 03/31/17.  6CIT score = 11

## 2017-03-30 DIAGNOSIS — H0259 Other disorders affecting eyelid function: Secondary | ICD-10-CM | POA: Diagnosis not present

## 2017-03-30 LAB — HM DIABETES EYE EXAM

## 2017-04-01 ENCOUNTER — Other Ambulatory Visit: Payer: Self-pay | Admitting: Internal Medicine

## 2017-04-01 ENCOUNTER — Encounter: Payer: Self-pay | Admitting: Internal Medicine

## 2017-04-01 ENCOUNTER — Ambulatory Visit (INDEPENDENT_AMBULATORY_CARE_PROVIDER_SITE_OTHER): Payer: Medicare HMO | Admitting: Internal Medicine

## 2017-04-01 VITALS — BP 118/78 | HR 59 | Ht 64.0 in | Wt 182.0 lb

## 2017-04-01 DIAGNOSIS — E1129 Type 2 diabetes mellitus with other diabetic kidney complication: Secondary | ICD-10-CM

## 2017-04-01 DIAGNOSIS — T7840XD Allergy, unspecified, subsequent encounter: Secondary | ICD-10-CM

## 2017-04-01 DIAGNOSIS — R809 Proteinuria, unspecified: Secondary | ICD-10-CM

## 2017-04-01 DIAGNOSIS — H409 Unspecified glaucoma: Secondary | ICD-10-CM | POA: Diagnosis not present

## 2017-04-01 DIAGNOSIS — Z0000 Encounter for general adult medical examination without abnormal findings: Secondary | ICD-10-CM | POA: Diagnosis not present

## 2017-04-01 DIAGNOSIS — Z0001 Encounter for general adult medical examination with abnormal findings: Secondary | ICD-10-CM

## 2017-04-01 LAB — POCT URINALYSIS DIPSTICK
BILIRUBIN UA: NEGATIVE
Blood, UA: NEGATIVE
Glucose, UA: NEGATIVE
KETONES UA: NEGATIVE
Leukocytes, UA: NEGATIVE
Nitrite, UA: NEGATIVE
Protein, UA: NEGATIVE
Spec Grav, UA: 1.02 (ref 1.010–1.025)
Urobilinogen, UA: 0.2 E.U./dL
pH, UA: 6.5 (ref 5.0–8.0)

## 2017-04-01 MED ORDER — LOSARTAN POTASSIUM 25 MG PO TABS: 25.0000 mg | ORAL_TABLET | Freq: Every day | ORAL | 3 refills | Status: DC

## 2017-04-01 NOTE — Progress Notes (Signed)
Date:  04/01/2017   Name:  Natalie Gibbs   DOB:  1940/10/09   MRN:  [id number]   Chief Complaint: Annual Exam (Breast Exam. ) Natalie Gibbs is a 77 y.o. female who presents today for her Complete Annual Exam. She feels well. She reports exercising very little - but caring full time for daughter with MS. She reports she is sleeping well.   Diabetes  She presents for her follow-up diabetic visit. She has type 2 diabetes mellitus. Her disease course has been stable. Pertinent negatives for hypoglycemia include no dizziness, headaches, nervousness/anxiousness or tremors. Pertinent negatives for diabetes include no chest pain, no fatigue, no polydipsia and no polyuria. Current diabetic treatment includes diet. She is compliant with treatment most of the time. She monitors blood glucose at home 1-2 x per day. Her breakfast blood glucose is taken between 7-8 am. Her breakfast blood glucose range is generally 90-110 mg/dl.   Glaucoma - followed by Ophthalmology.  No eye exam in chart.    Breast surgery - had surgery last fall for breast mass - intraductal papilloma by Dr. Fleet Contras.   She is doing well, healed.    Review of Systems  Constitutional: Negative for chills, fatigue and fever.  HENT: Negative for congestion, hearing loss, tinnitus, trouble swallowing and voice change.   Eyes: Negative for visual disturbance.  Respiratory: Negative for cough, chest tightness, shortness of breath and wheezing.   Cardiovascular: Negative for chest pain, palpitations and leg swelling.  Gastrointestinal: Negative for abdominal pain, constipation, diarrhea and vomiting.  Endocrine: Negative for polydipsia and polyuria.  Genitourinary: Negative for dysuria, frequency, genital sores, vaginal bleeding and vaginal discharge.  Musculoskeletal: Negative for arthralgias, gait problem and joint swelling.  Skin: Negative for color change and rash.  Neurological: Negative for dizziness, tremors,  light-headedness and headaches.  Hematological: Negative for adenopathy. Does not bruise/bleed easily.  Psychiatric/Behavioral: Negative for dysphoric mood and sleep disturbance. The patient is not nervous/anxious.     Patient Active Problem List   Diagnosis Date Noted  . Intraductal papilloma of breast 10/27/2016  . Endometrial hyperplasia 06/14/2016  . Type 2 diabetes mellitus with microalbuminuria, without long-term current use of insulin ([place]) 05/15/2014  . Allergic state 05/15/2014  . Fibrocystic breast 05/15/2014  . Glaucoma 05/15/2014    Prior to Admission medications   Medication Sig Start Date End Date Taking? Authorizing Provider  ACCU-CHEK SOFTCLIX LANCETS lancets 1 each by Other route daily. Use as instructed 03/03/17   Glean Hess, MD  aspirin 81 MG tablet Take 81 mg by mouth daily.    [provider]  Blood Glucose Monitoring Suppl (ACCU-CHEK AVIVA) device Use as instructed 03/03/17 03/03/18  Glean Hess, MD  brimonidine (ALPHAGAN) 0.2 % ophthalmic solution Place 1 drop into both eyes 2 (two) times daily.     [provider]  dorzolamide-timolol (COSOPT) 22.3-6.8 MG/ML ophthalmic solution Place 1 drop into both eyes 2 (two) times daily.    [provider]  glucose blood (ACCU-CHEK AVIVA) test strip Test blood sugar once daily 03/03/17   Glean Hess, MD  latanoprost (XALATAN) 0.005 % ophthalmic solution Place 1 drop into both eyes at bedtime.     [provider]  losartan (COZAAR) 25 MG tablet TAKE ONE TABLET BY MOUTH ONCE DAILY 11/08/16   Glean Hess, MD    Allergies  Allergen Reactions  . Iodine Itching  . Shellfish Allergy Itching  . Codeine Rash  Past Surgical History:  Procedure Laterality Date  . BREAST BIOPSY Left 20 + yrs ago   benign  . BREAST CYST ASPIRATION Left 2015   neg  . BREAST DUCTAL SYSTEM EXCISION Left 10/26/2016   Procedure: LEFT BREAST SUBAREOLA DUCT MASS EXCISION WITH ULTRASOUND  GUIDANCE;  Surgeon: Christene Lye, MD;  Location: [facility] ORS;  Service: General;  Laterality: Left;  . CATARACT EXTRACTION, BILATERAL Bilateral 2017  . COLONOSCOPY  2010   Dr. Candace Cruise, [facility]  . COLONOSCOPY N/A 05/13/2014   Procedure: COLONOSCOPY;  Surgeon: Hulen Luster, MD;  Location: [facility] ENDOSCOPY;  Service: Gastroenterology;  Laterality: N/A;  . excision mass abdominal wall  1997  . EYE SURGERY  2017   bilateral  . TUBAL LIGATION      Social History   Tobacco Use  . Smoking status: Never Smoker  . Smokeless tobacco: Never Used  . Tobacco comment: smoking cessation materials not required  Substance Use Topics  . Alcohol use: No    Alcohol/week: 0.0 oz  . Drug use: No     Medication list has been reviewed and updated.  PHQ 2/9 Scores 03/23/2017 03/31/2016 02/04/2015  PHQ - 2 Score 0 0 0  PHQ- 9 Score 0 - -    Physical Exam  Constitutional: She is oriented to person, place, and time. She appears well-developed and well-nourished. No distress.  HENT:  Head: Normocephalic and atraumatic.  Right Ear: Tympanic membrane and ear canal normal.  Left Ear: Tympanic membrane and ear canal normal.  Nose: Right sinus exhibits no maxillary sinus tenderness. Left sinus exhibits no maxillary sinus tenderness.  Mouth/Throat: Uvula is midline and oropharynx is clear and moist.  Eyes: Conjunctivae and EOM are normal. Right eye exhibits no discharge. Left eye exhibits no discharge. No scleral icterus.  Neck: Normal range of motion. Carotid bruit is not present. No erythema present. No thyromegaly present.  Cardiovascular: Normal rate, regular rhythm, normal heart sounds and normal pulses.  Pulmonary/Chest: Effort normal. No respiratory distress. She has no wheezes. Right breast exhibits no mass, no nipple discharge, no skin change and no tenderness. Left breast exhibits no mass, no nipple discharge, no skin change and no tenderness.  Abdominal: Soft. Bowel sounds are normal. There is no  hepatosplenomegaly. There is no tenderness. There is no CVA tenderness.  Musculoskeletal: Normal range of motion.  Lymphadenopathy:    She has no cervical adenopathy.    She has no axillary adenopathy.  Neurological: She is alert and oriented to person, place, and time. She has normal reflexes. No cranial nerve deficit or sensory deficit.  Skin: Skin is warm, dry and intact. No rash noted.  Psychiatric: She has a normal mood and affect. Her speech is normal and behavior is normal. Thought content normal.  Nursing note and vitals reviewed.   BP 118/78   Pulse (!) 59   Ht 5\' 4"  (1.626 m)   Wt 182 lb (82.6 kg)   SpO2 96%   BMI 31.24 kg/m   Assessment and Plan: 1. Annual physical exam Doing well; continue regular exercise Self breast exams - TSH  2. Type 2 diabetes mellitus with microalbuminuria, without long-term current use of insulin (HCC) Diet controlled On ARB - CBC with Differential/Platelet - Comprehensive metabolic panel - Hemoglobin A1c - Lipid panel - Microalbumin / creatinine urine ratio - POCT urinalysis dipstick - losartan (COZAAR) 25 MG tablet; Take 1 tablet (25 mg total) by mouth daily.  Dispense: 90 tablet; Refill: 3  3. Allergic state,  subsequent encounter stable  4. Glaucoma of both eyes, unspecified glaucoma type Followed by opthalmology   Meds ordered this encounter  Medications  . losartan (COZAAR) 25 MG tablet    Sig: Take 1 tablet (25 mg total) by mouth daily.    Dispense:  90 tablet    Refill:  3    Partially dictated using Editor, commissioning. Any errors are unintentional.  Halina Maidens, MD [facility]  04/01/2017

## 2017-04-01 NOTE — Patient Instructions (Signed)

## 2017-04-02 LAB — HEMOGLOBIN A1C
Est. average glucose Bld gHb Est-mCnc: 117 mg/dL
Hgb A1c MFr Bld: 5.7 % — ABNORMAL HIGH (ref 4.8–5.6)

## 2017-04-02 LAB — CBC WITH DIFFERENTIAL/PLATELET
Basophils Absolute: 0 10*3/uL (ref 0.0–0.2)
Basos: 1 %
EOS (ABSOLUTE): 0.2 10*3/uL (ref 0.0–0.4)
Eos: 3 %
Hematocrit: 38.3 % (ref 34.0–46.6)
Hemoglobin: 11.9 g/dL (ref 11.1–15.9)
Immature Grans (Abs): 0 10*3/uL (ref 0.0–0.1)
Immature Granulocytes: 0 %
Lymphocytes Absolute: 2 10*3/uL (ref 0.7–3.1)
Lymphs: 40 %
MCH: 28 pg (ref 26.6–33.0)
MCHC: 31.1 g/dL — ABNORMAL LOW (ref 31.5–35.7)
MCV: 90 fL (ref 79–97)
Monocytes Absolute: 0.3 10*3/uL (ref 0.1–0.9)
Monocytes: 7 %
Neutrophils Absolute: 2.6 10*3/uL (ref 1.4–7.0)
Neutrophils: 49 %
Platelets: 258 10*3/uL (ref 150–379)
RBC: 4.25 x10E6/uL (ref 3.77–5.28)
RDW: 13.6 % (ref 12.3–15.4)
WBC: 5.1 10*3/uL (ref 3.4–10.8)

## 2017-04-02 LAB — COMPREHENSIVE METABOLIC PANEL WITH GFR
ALT: 15 [IU]/L (ref 0–32)
AST: 12 [IU]/L (ref 0–40)
Albumin/Globulin Ratio: 1.3 (ref 1.2–2.2)
Albumin: 4.1 g/dL (ref 3.5–4.8)
Alkaline Phosphatase: 99 [IU]/L (ref 39–117)
BUN/Creatinine Ratio: 14 (ref 12–28)
BUN: 11 mg/dL (ref 8–27)
Bilirubin Total: 0.4 mg/dL (ref 0.0–1.2)
CO2: 24 mmol/L (ref 20–29)
Calcium: 9.2 mg/dL (ref 8.7–10.3)
Chloride: 106 mmol/L (ref 96–106)
Creatinine, Ser: 0.79 mg/dL (ref 0.57–1.00)
GFR calc Af Amer: 85 mL/min/{1.73_m2}
GFR calc non Af Amer: 73 mL/min/{1.73_m2}
Globulin, Total: 3.2 g/dL (ref 1.5–4.5)
Glucose: 104 mg/dL — ABNORMAL HIGH (ref 65–99)
Potassium: 4.9 mmol/L (ref 3.5–5.2)
Sodium: 142 mmol/L (ref 134–144)
Total Protein: 7.3 g/dL (ref 6.0–8.5)

## 2017-04-02 LAB — LIPID PANEL
CHOL/HDL RATIO: 3.1 ratio (ref 0.0–4.4)
Cholesterol, Total: 130 mg/dL (ref 100–199)
HDL: 42 mg/dL (ref >39)
LDL Calculated: 74 mg/dL (ref 0–99)
TRIGLYCERIDES: 69 mg/dL (ref 0–149)
VLDL Cholesterol Cal: 14 mg/dL (ref 5–40)

## 2017-04-02 LAB — TSH: TSH: 2.54 u[IU]/mL (ref 0.450–4.500)

## 2017-04-05 LAB — SPECIMEN STATUS REPORT

## 2017-04-05 LAB — MICROALBUMIN / CREATININE URINE RATIO
CREATININE, UR: 132.7 mg/dL
MICROALB/CREAT RATIO: 45.8 mg/g{creat} — AB (ref 0.0–30.0)
Microalbumin, Urine: 60.8 ug/mL

## 2017-04-09 ENCOUNTER — Encounter: Payer: Self-pay | Admitting: Internal Medicine

## 2017-04-12 ENCOUNTER — Other Ambulatory Visit: Payer: Self-pay | Admitting: General Surgery

## 2017-04-12 ENCOUNTER — Ambulatory Visit
Admission: RE | Admit: 2017-04-12 | Discharge: 2017-04-12 | Disposition: A | Discharge status: Home / Self Care | Payer: Medicare HMO | Source: Ambulatory Visit | Attending: General Surgery | Admitting: General Surgery

## 2017-04-12 DIAGNOSIS — R928 Other abnormal and inconclusive findings on diagnostic imaging of breast: Secondary | ICD-10-CM

## 2017-04-12 DIAGNOSIS — N6310 Unspecified lump in the right breast, unspecified quadrant: Secondary | ICD-10-CM

## 2017-04-12 DIAGNOSIS — Z1231 Encounter for screening mammogram for malignant neoplasm of breast: Secondary | ICD-10-CM

## 2017-04-19 ENCOUNTER — Encounter: Payer: Self-pay | Admitting: General Surgery

## 2017-04-19 ENCOUNTER — Ambulatory Visit (INDEPENDENT_AMBULATORY_CARE_PROVIDER_SITE_OTHER): Payer: Medicare HMO | Admitting: General Surgery

## 2017-04-19 VITALS — BP 130/68 | HR 72 | Resp 13 | Ht 60.0 in | Wt 187.0 lb

## 2017-04-19 DIAGNOSIS — R928 Other abnormal and inconclusive findings on diagnostic imaging of breast: Secondary | ICD-10-CM

## 2017-04-19 DIAGNOSIS — D242 Benign neoplasm of left breast: Secondary | ICD-10-CM

## 2017-04-19 NOTE — Progress Notes (Signed)
[name], female   DOB: 1940-05-01, 77 y.o.   MRN: [id number]  Chief Complaint  Patient presents with  . Follow-up    HPI Natalie Gibbs is a 77 y.o. female who presents for a breast evaluation. The most recent mammogram was done on 04/12/2017.  Patient does perform regular self breast checks and gets regular mammograms done.    HPI  Past Medical History:  Diagnosis Date  . Allergy   . Arthritis   . Diffuse cystic mastopathy   . Glaucoma   . Hypertension   . Obesity, unspecified   . Pre-diabetes   . Special screening for malignant neoplasms, colon     Past Surgical History:  Procedure Laterality Date  . BREAST BIOPSY Left 20 + yrs ago   benign  . BREAST CYST ASPIRATION Left 2015   neg  . BREAST DUCTAL SYSTEM EXCISION Left 10/26/2016   Procedure: LEFT BREAST SUBAREOLA DUCT MASS EXCISION WITH ULTRASOUND GUIDANCE;  Surgeon: Christene Lye, MD;  Location: [facility] ORS;  Service: General;  Laterality: Left;  . CATARACT EXTRACTION, BILATERAL Bilateral 2017  . COLONOSCOPY  2010   Dr. Candace Cruise, [facility]  . COLONOSCOPY N/A 05/13/2014   Procedure: COLONOSCOPY;  Surgeon: Hulen Luster, MD;  Location: [facility] ENDOSCOPY;  Service: Gastroenterology;  Laterality: N/A;  . COLONOSCOPY  04/28/2016   Dr. Jamal Collin.  [name] excision mass abdominal wall  1997  . EYE SURGERY  2017   bilateral  . TUBAL LIGATION      Family History  Problem Relation Age of Onset  . Healthy Mother   . Emphysema Father   . Breast cancer Other        niece/breast  . Diabetes Sister   . Diabetes Sister   . Breast cancer Other        niece    Social History Social History   Tobacco Use  . Smoking status: Never Smoker  . Smokeless tobacco: Never Used  . Tobacco comment: smoking cessation materials not required  Substance Use Topics  . Alcohol use: No    Alcohol/week: 0.0 oz  . Drug use: No    Allergies  Allergen Reactions  . Iodine Itching  . Shellfish Allergy Itching  . Codeine  Rash    Current Outpatient Medications  Medication Sig Dispense Refill  . ACCU-CHEK SOFTCLIX LANCETS lancets 1 each by Other route daily. Use as instructed 100 each 3  . aspirin 81 MG tablet Take 81 mg by mouth daily.    . Blood Glucose Monitoring Suppl (ACCU-CHEK AVIVA) device Use as instructed 1 each 0  . brimonidine (ALPHAGAN) 0.2 % ophthalmic solution Place 1 drop into both eyes 2 (two) times daily.     . dorzolamide-timolol (COSOPT) 22.3-6.8 MG/ML ophthalmic solution Place 1 drop into both eyes 2 (two) times daily.    [name] glucose blood (ACCU-CHEK AVIVA) test strip Test blood sugar once daily 100 each 3  . latanoprost (XALATAN) 0.005 % ophthalmic solution Place 1 drop into both eyes at bedtime.     [name] losartan (COZAAR) 25 MG tablet Take 1 tablet (25 mg total) by mouth daily. 90 tablet 3   No current facility-administered medications for this visit.     Review of Systems Review of Systems  Constitutional: Negative.   Respiratory: Negative.   Cardiovascular: Negative.     Blood pressure 130/68, pulse 72, resp. rate 13, height 5' (1.524 m), weight 187 lb (84.8 kg).  Physical Exam Physical Exam  Constitutional: She is oriented to person, place, and time. She appears well-developed and well-nourished.  Eyes: Conjunctivae are normal. No scleral icterus.  Neck: Neck supple.  Cardiovascular: Normal rate, regular rhythm and normal heart sounds.  Pulmonary/Chest: Effort normal and breath sounds normal. Right breast exhibits no inverted nipple, no mass, no nipple discharge, no skin change and no tenderness. Left breast exhibits no inverted nipple, no mass, no nipple discharge, no skin change and no tenderness.  Lymphadenopathy:    She has no cervical adenopathy.    She has no axillary adenopathy.  Neurological: She is alert and oriented to person, place, and time.  Skin: Skin is warm and dry.    Data Reviewed April 09, 2017 mammograms reviewed. Possible new posterior density on the  right breast. BIRAD 0.  Assessment    Normal exam, abnormal mammogram pending additional views.     Plan  The patient has been asked have added views. Due for her colonoscopy in April 2023. The patient is aware to call back for any questions or concerns.   HPI, Physical Exam, Assessment and Plan have been scribed under the direction and in the presence of Natalie Ard, MD.  Natalie Gibbs, CMA  I have completed the exam and reviewed the above documentation for accuracy and completeness.  I agree with the above.  Haematologist has been used and any errors in dictation or transcription are unintentional.  Natalie Gibbs, M.D., F.A.C.S.   Natalie Gibbs 04/19/2017, 9:50 AM

## 2017-04-19 NOTE — Patient Instructions (Addendum)
The patient has been asked have added views. Due for her colonoscopy in April 2023. The patient is aware to call back for any questions or concerns.

## 2017-04-25 ENCOUNTER — Ambulatory Visit: Payer: Medicare HMO

## 2017-04-25 ENCOUNTER — Other Ambulatory Visit: Payer: Medicare HMO

## 2017-04-29 ENCOUNTER — Encounter: Payer: Medicare HMO | Admitting: Internal Medicine

## 2017-05-04 ENCOUNTER — Ambulatory Visit
Admission: RE | Admit: 2017-05-04 | Discharge: 2017-05-04 | Disposition: A | Discharge status: Home / Self Care | Payer: Medicare HMO | Source: Ambulatory Visit | Attending: General Surgery | Admitting: General Surgery

## 2017-05-04 DIAGNOSIS — N6313 Unspecified lump in the right breast, lower outer quadrant: Secondary | ICD-10-CM | POA: Insufficient documentation

## 2017-05-04 DIAGNOSIS — N6310 Unspecified lump in the right breast, unspecified quadrant: Secondary | ICD-10-CM

## 2017-05-04 DIAGNOSIS — R928 Other abnormal and inconclusive findings on diagnostic imaging of breast: Secondary | ICD-10-CM | POA: Diagnosis present

## 2017-05-05 ENCOUNTER — Other Ambulatory Visit: Payer: Self-pay | Admitting: Internal Medicine

## 2017-05-10 ENCOUNTER — Other Ambulatory Visit: Payer: Self-pay | Admitting: Internal Medicine

## 2017-05-11 DIAGNOSIS — H402231 Chronic angle-closure glaucoma, bilateral, mild stage: Secondary | ICD-10-CM | POA: Diagnosis not present

## 2017-06-29 DIAGNOSIS — H40153 Residual stage of open-angle glaucoma, bilateral: Secondary | ICD-10-CM | POA: Diagnosis not present

## 2017-06-29 DIAGNOSIS — H524 Presbyopia: Secondary | ICD-10-CM | POA: Diagnosis not present

## 2017-07-11 ENCOUNTER — Other Ambulatory Visit: Payer: Self-pay | Admitting: Internal Medicine

## 2017-09-12 DIAGNOSIS — H16223 Keratoconjunctivitis sicca, not specified as Sjogren's, bilateral: Secondary | ICD-10-CM | POA: Diagnosis not present

## 2017-09-13 DIAGNOSIS — Z86010 Personal history of colonic polyps: Secondary | ICD-10-CM | POA: Diagnosis not present

## 2017-10-03 ENCOUNTER — Encounter: Payer: Self-pay | Admitting: Internal Medicine

## 2017-10-03 ENCOUNTER — Ambulatory Visit (INDEPENDENT_AMBULATORY_CARE_PROVIDER_SITE_OTHER): Payer: Medicare HMO | Admitting: Internal Medicine

## 2017-10-03 VITALS — BP 128/70 | HR 77 | Ht 64.0 in | Wt 181.0 lb

## 2017-10-03 DIAGNOSIS — R809 Proteinuria, unspecified: Secondary | ICD-10-CM | POA: Diagnosis not present

## 2017-10-03 DIAGNOSIS — E1129 Type 2 diabetes mellitus with other diabetic kidney complication: Secondary | ICD-10-CM

## 2017-10-03 NOTE — Progress Notes (Signed)
Date:  10/03/2017   Name:  Natalie Gibbs   DOB:  11/11/1940   MRN:  [id number]   Chief Complaint: Diabetes Pt declines flu vaccine.  She has colonoscopy scheduled for next month.  Diabetes  She presents for her follow-up diabetic visit. She has type 2 diabetes mellitus. Her disease course has been stable. Pertinent negatives for hypoglycemia include no headaches or tremors. Pertinent negatives for diabetes include no chest pain, no fatigue, no polydipsia and no polyuria. Current diabetic treatment includes diet. She is compliant with treatment most of the time. Her weight is stable. She is following a diabetic diet. She monitors blood glucose at home 3-4 x per week. Her breakfast blood glucose is taken between 6-7 am. Her breakfast blood glucose range is generally 90-110 mg/dl. An ACE inhibitor/angiotensin II receptor blocker is being taken.   Lab Results  Component Value Date   HGBA1C 5.7 (H) 04/01/2017   Lab Results  Component Value Date   CREATININE 0.79 04/01/2017   BUN 11 04/01/2017   NA 142 04/01/2017   K 4.9 04/01/2017   CL 106 04/01/2017   CO2 24 04/01/2017     Review of Systems  Constitutional: Negative for appetite change, fatigue, fever and unexpected weight change.  HENT: Negative for tinnitus and trouble swallowing.   Respiratory: Negative for cough, chest tightness and shortness of breath.   Cardiovascular: Negative for chest pain, palpitations and leg swelling.  Gastrointestinal: Negative for abdominal pain.  Endocrine: Negative for polydipsia and polyuria.  Genitourinary: Negative for dysuria and hematuria.  Musculoskeletal: Negative for arthralgias.  Neurological: Negative for tremors, numbness and headaches.  Psychiatric/Behavioral: Negative for dysphoric mood.    Patient Active Problem List   Diagnosis Date Noted  . Abnormal mammogram of right breast 04/19/2017  . Intraductal papilloma of breast 10/27/2016  . Endometrial hyperplasia  06/14/2016  . Type 2 diabetes mellitus with microalbuminuria, without long-term current use of insulin ([facility]) 05/15/2014  . Allergic state 05/15/2014  . Fibrocystic breast 05/15/2014  . Glaucoma 05/15/2014    Allergies  Allergen Reactions  . Iodine Itching  . Shellfish Allergy Itching  . Codeine Rash    Past Surgical History:  Procedure Laterality Date  . BREAST BIOPSY Left 20 + yrs ago   benign  . BREAST CYST ASPIRATION Left 2015   neg  . BREAST DUCTAL SYSTEM EXCISION Left 10/26/2016   Intraductal papilloma Surgeon: Christene Lye, MD;  Location: [facility] ORS;  Service: General;  Laterality: Left;  . CATARACT EXTRACTION, BILATERAL Bilateral 2017  . COLONOSCOPY  2010   Dr. Candace Cruise, [facility]  . COLONOSCOPY N/A 05/13/2014   Procedure: COLONOSCOPY;  Surgeon: Hulen Luster, MD;  Location: [facility] ENDOSCOPY;  Service: Gastroenterology;  Laterality: N/A;  . COLONOSCOPY  04/28/2016   Dr. Jamal Collin.  [name] excision mass abdominal wall  1997  . EYE SURGERY  2017   bilateral  . TUBAL LIGATION      Social History   Tobacco Use  . Smoking status: Never Smoker  . Smokeless tobacco: Never Used  . Tobacco comment: smoking cessation materials not required  Substance Use Topics  . Alcohol use: No    Alcohol/week: 0.0 standard drinks  . Drug use: No     Medication list has been reviewed and updated.  Current Meds  Medication Sig  . ACCU-CHEK SOFTCLIX LANCETS lancets 1 each by Other route daily. Use as instructed  . Alcohol Swabs (B-D SINGLE USE SWABS REGULAR) PADS USE AS DIRECTED  .  aspirin 81 MG tablet Take 81 mg by mouth daily.  . Blood Glucose Monitoring Suppl (ACCU-CHEK AVIVA PLUS) w/Device KIT USE  AS  INSTRUCTED  . brimonidine (ALPHAGAN) 0.2 % ophthalmic solution Place 1 drop into both eyes 2 (two) times daily.   . dorzolamide-timolol (COSOPT) 22.3-6.8 MG/ML ophthalmic solution Place 1 drop into both eyes 2 (two) times daily.  [name] glucose blood (ACCU-CHEK AVIVA) test strip Test blood sugar once  daily  . latanoprost (XALATAN) 0.005 % ophthalmic solution Place 1 drop into both eyes at bedtime.   [name] losartan (COZAAR) 25 MG tablet Take 1 tablet (25 mg total) by mouth daily.  . [DISCONTINUED] Blood Glucose Monitoring Suppl (ACCU-CHEK AVIVA) device Use as instructed    PHQ 2/9 Scores 03/23/2017 03/31/2016 02/04/2015  PHQ - 2 Score 0 0 0  PHQ- 9 Score 0 - -    Physical Exam  Constitutional: She is oriented to person, place, and time. She appears well-developed. No distress.  HENT:  Head: Normocephalic and atraumatic.  Neck: Normal range of motion. Neck supple.  Cardiovascular: Normal rate, regular rhythm and normal heart sounds.  Pulses:      Dorsalis pedis pulses are 2+ on the right side, and 2+ on the left side.       Posterior tibial pulses are 2+ on the right side, and 2+ on the left side.  Pulmonary/Chest: Effort normal and breath sounds normal. No respiratory distress.  Musculoskeletal: Normal range of motion.  Lymphadenopathy:    She has no cervical adenopathy.  Neurological: She is alert and oriented to person, place, and time.  Skin: Skin is warm and dry. No rash noted.  Psychiatric: She has a normal mood and affect. Her behavior is normal. Thought content normal.  Nursing note and vitals reviewed.   BP 128/70 (BP Location: Right Arm, Patient Position: Sitting, Cuff Size: Normal)   Pulse 77   Ht 5' 4"  (1.626 m)   Wt 181 lb (82.1 kg)   SpO2 96%   BMI 31.07 kg/m   Assessment and Plan: 1. Type 2 diabetes mellitus with microalbuminuria, without long-term current use of insulin (HCC) Doing well with diet control - Basic metabolic panel - Hemoglobin A1c   Partially dictated using Editor, commissioning. Any errors are unintentional.  Halina Maidens, MD [facility]  10/03/2017

## 2017-10-04 LAB — BASIC METABOLIC PANEL
BUN / CREAT RATIO: 13 (ref 12–28)
BUN: 9 mg/dL (ref 8–27)
CALCIUM: 9.3 mg/dL (ref 8.7–10.3)
CHLORIDE: 102 mmol/L (ref 96–106)
CO2: 24 mmol/L (ref 20–29)
Creatinine, Ser: 0.71 mg/dL (ref 0.57–1.00)
GFR calc Af Amer: 96 mL/min/{1.73_m2} (ref >59)
GFR calc non Af Amer: 84 mL/min/{1.73_m2} (ref >59)
GLUCOSE: 110 mg/dL — AB (ref 65–99)
Potassium: 4.7 mmol/L (ref 3.5–5.2)
Sodium: 140 mmol/L (ref 134–144)

## 2017-10-04 LAB — HEMOGLOBIN A1C
Est. average glucose Bld gHb Est-mCnc: 120 mg/dL
HEMOGLOBIN A1C: 5.8 % — AB (ref 4.8–5.6)

## 2017-10-07 ENCOUNTER — Encounter: Payer: Medicare HMO | Admitting: Internal Medicine

## 2017-10-11 ENCOUNTER — Ambulatory Visit: Payer: Medicare HMO | Admitting: Anesthesiology

## 2017-10-11 ENCOUNTER — Encounter: Payer: Self-pay | Admitting: Anesthesiology

## 2017-10-11 ENCOUNTER — Encounter
Admission: RE | Disposition: A | Discharge status: Home / Self Care | Payer: Self-pay | Source: Ambulatory Visit | Attending: Internal Medicine

## 2017-10-11 ENCOUNTER — Ambulatory Visit
Admission: RE | Admit: 2017-10-11 | Discharge: 2017-10-11 | Disposition: A | Discharge status: Home / Self Care | Payer: Medicare HMO | Source: Ambulatory Visit | Attending: Internal Medicine | Admitting: Internal Medicine

## 2017-10-11 DIAGNOSIS — K5730 Diverticulosis of large intestine without perforation or abscess without bleeding: Secondary | ICD-10-CM | POA: Insufficient documentation

## 2017-10-11 DIAGNOSIS — Z91013 Allergy to seafood: Secondary | ICD-10-CM | POA: Insufficient documentation

## 2017-10-11 DIAGNOSIS — Z79899 Other long term (current) drug therapy: Secondary | ICD-10-CM | POA: Diagnosis not present

## 2017-10-11 DIAGNOSIS — Z1211 Encounter for screening for malignant neoplasm of colon: Secondary | ICD-10-CM | POA: Diagnosis present

## 2017-10-11 DIAGNOSIS — Z86010 Personal history of colonic polyps: Secondary | ICD-10-CM | POA: Insufficient documentation

## 2017-10-11 DIAGNOSIS — Z885 Allergy status to narcotic agent status: Secondary | ICD-10-CM | POA: Diagnosis not present

## 2017-10-11 DIAGNOSIS — Z8371 Family history of colonic polyps: Secondary | ICD-10-CM | POA: Diagnosis not present

## 2017-10-11 DIAGNOSIS — M1990 Unspecified osteoarthritis, unspecified site: Secondary | ICD-10-CM | POA: Diagnosis not present

## 2017-10-11 DIAGNOSIS — E1139 Type 2 diabetes mellitus with other diabetic ophthalmic complication: Secondary | ICD-10-CM | POA: Diagnosis not present

## 2017-10-11 DIAGNOSIS — Z7982 Long term (current) use of aspirin: Secondary | ICD-10-CM | POA: Diagnosis not present

## 2017-10-11 DIAGNOSIS — I10 Essential (primary) hypertension: Secondary | ICD-10-CM | POA: Insufficient documentation

## 2017-10-11 DIAGNOSIS — K640 First degree hemorrhoids: Secondary | ICD-10-CM | POA: Diagnosis not present

## 2017-10-11 DIAGNOSIS — E669 Obesity, unspecified: Secondary | ICD-10-CM | POA: Diagnosis not present

## 2017-10-11 DIAGNOSIS — Z6830 Body mass index (BMI) 30.0-30.9, adult: Secondary | ICD-10-CM | POA: Insufficient documentation

## 2017-10-11 DIAGNOSIS — H42 Glaucoma in diseases classified elsewhere: Secondary | ICD-10-CM | POA: Insufficient documentation

## 2017-10-11 DIAGNOSIS — Z888 Allergy status to other drugs, medicaments and biological substances status: Secondary | ICD-10-CM | POA: Insufficient documentation

## 2017-10-11 DIAGNOSIS — E119 Type 2 diabetes mellitus without complications: Secondary | ICD-10-CM | POA: Diagnosis not present

## 2017-10-11 DIAGNOSIS — K648 Other hemorrhoids: Secondary | ICD-10-CM | POA: Diagnosis not present

## 2017-10-11 HISTORY — PX: COLONOSCOPY WITH PROPOFOL: SHX5780

## 2017-10-11 SURGERY — COLONOSCOPY WITH PROPOFOL
Anesthesia: General

## 2017-10-11 MED ORDER — PROPOFOL 10 MG/ML IV BOLUS
INTRAVENOUS | Status: DC | PRN
  Administered 2017-10-11: 100 mg via INTRAVENOUS

## 2017-10-11 MED ORDER — SODIUM CHLORIDE 0.9 % IV SOLN
INTRAVENOUS | Status: DC
  Administered 2017-10-11: 1000 mL via INTRAVENOUS
  Administered 2017-10-11: 12:00:00 via INTRAVENOUS

## 2017-10-11 MED ORDER — PROPOFOL 500 MG/50ML IV EMUL
INTRAVENOUS | Status: AC
  Filled 2017-10-11: qty 50

## 2017-10-11 MED ORDER — LIDOCAINE 2% (20 MG/ML) 5 ML SYRINGE
INTRAMUSCULAR | Status: DC | PRN
  Administered 2017-10-11: 30 mg via INTRAVENOUS

## 2017-10-11 MED ORDER — FENTANYL CITRATE (PF) 100 MCG/2ML IJ SOLN
INTRAMUSCULAR | Status: AC
  Filled 2017-10-11: qty 2

## 2017-10-11 MED ORDER — PHENYLEPHRINE HCL 10 MG/ML IJ SOLN
INTRAMUSCULAR | Status: DC | PRN
  Administered 2017-10-11: 100 ug via INTRAVENOUS

## 2017-10-11 MED ORDER — LIDOCAINE HCL URETHRAL/MUCOSAL 2 % EX GEL
CUTANEOUS | Status: AC
  Filled 2017-10-11: qty 5

## 2017-10-11 MED ORDER — PROPOFOL 500 MG/50ML IV EMUL
INTRAVENOUS | Status: DC | PRN
  Administered 2017-10-11: 200 ug/kg/min via INTRAVENOUS

## 2017-10-11 NOTE — Transfer of Care (Signed)
Immediate Anesthesia Transfer of Care Note  Patient: Natalie Gibbs  Procedure(s) Performed: COLONOSCOPY WITH PROPOFOL (N/A )  Patient Location: PACU and Endoscopy Unit  Anesthesia Type:General  Level of Consciousness: drowsy  Airway & Oxygen Therapy: Patient Spontanous Breathing and Patient connected to nasal cannula oxygen  Post-op Assessment: Report given to RN and Post -op Vital signs reviewed and stable  Post vital signs: Reviewed and stable  Last Vitals:  Vitals Value Taken Time  BP    Temp    Pulse    Resp    SpO2      Last Pain:  Vitals:   10/11/17 1052  TempSrc: Tympanic  PainSc: 0-No pain         Complications: No apparent anesthesia complications

## 2017-10-11 NOTE — H&P (Signed)
Outpatient short stay form Pre-procedure 10/11/2017 8:26 AM Natalie Gibbs, M.D.  Primary Physician: Halina Maidens, M.D.  Reason for visit:  Personal hx of colon polyps. FH of colon polyps (sister).  History of present illness:  Patient with hx of serrated adenoma of the colon on 2015 colonoscopy followed by only polypoid colonic mucosa (Dr. Candace Cruise) on biopsy on follow up colonoscopy in 2016. Patient denies change in bowel habits, rectal bleeding, weight loss or abdominal pain.    No current facility-administered medications for this encounter.   Current Outpatient Medications:  .  brinzolamide (AZOPT) 1 % ophthalmic suspension, 1 drop 2 (two) times daily., Disp: , Rfl:  .  travoprost, benzalkonium, (TRAVATAN) 0.004 % ophthalmic solution, 1 drop at bedtime., Disp: , Rfl:  .  ACCU-CHEK SOFTCLIX LANCETS lancets, 1 each by Other route daily. Use as instructed, Disp: 100 each, Rfl: 3 .  Alcohol Swabs (B-D SINGLE USE SWABS REGULAR) PADS, USE AS DIRECTED, Disp: 300 each, Rfl: 3 .  aspirin 81 MG tablet, Take 81 mg by mouth daily., Disp: , Rfl:  .  Blood Glucose Monitoring Suppl (ACCU-CHEK AVIVA PLUS) w/Device KIT, USE  AS  INSTRUCTED, Disp: 1 kit, Rfl: 0 .  brimonidine (ALPHAGAN) 0.2 % ophthalmic solution, Place 1 drop into both eyes 2 (two) times daily. , Disp: , Rfl:  .  dorzolamide-timolol (COSOPT) 22.3-6.8 MG/ML ophthalmic solution, Place 1 drop into both eyes 2 (two) times daily., Disp: , Rfl:  .  glucose blood (ACCU-CHEK AVIVA) test strip, Test blood sugar once daily, Disp: 100 each, Rfl: 3 .  latanoprost (XALATAN) 0.005 % ophthalmic solution, Place 1 drop into both eyes at bedtime. , Disp: , Rfl:  .  losartan (COZAAR) 25 MG tablet, Take 1 tablet (25 mg total) by mouth daily., Disp: 90 tablet, Rfl: 3  No medications prior to admission.     Allergies  Allergen Reactions  . Iodine Itching  . Shellfish Allergy Itching  . Codeine Rash     Past Medical History:  Diagnosis Date  .  Allergy   . Arthritis   . Diffuse cystic mastopathy   . Glaucoma   . Hypertension   . Obesity, unspecified   . Pre-diabetes   . Special screening for malignant neoplasms, colon     Review of systems:  Otherwise negative.    Physical Exam  Gen: Alert, oriented. Appears stated age.  HEENT: [place]/AT. PERRLA. Lungs: CTA, no wheezes. CV: RR nl S1, S2. Abd: soft, benign, no masses. BS+ Ext: No edema. Pulses 2+    Planned procedures: Proceed with colonoscopy. The patient understands the nature of the planned procedure, indications, risks, alternatives and potential complications including but not limited to bleeding, infection, perforation, damage to internal organs and possible oversedation/side effects from anesthesia. The patient agrees and gives consent to proceed.  Please refer to procedure notes for findings, recommendations and patient disposition/instructions.     Natalie Gibbs, M.D. Gastroenterology 10/11/2017  8:26 AM

## 2017-10-11 NOTE — Op Note (Signed)
[facility] Gastroenterology Patient Name: Natalie Gibbs [facility] Procedure Date: 10/11/2017 11:43 AM MRN: [id number] Account #: [id number] Date of Birth: 05-Jun-1940 Admit Type: Outpatient Age: 77 Room: [facility] ENDO ROOM 3 Gender: Female Note Status: Finalized Procedure:            Colonoscopy Indications:          High risk colon cancer surveillance: Personal history                        of colonic polyps Providers:            Benay Pike. Alice Reichert MD, MD Referring MD:         Halina Maidens, MD (Referring MD) Medicines:            Propofol per Anesthesia Complications:        No immediate complications. Procedure:            Pre-Anesthesia Assessment:                       - The risks and benefits of the procedure and the                        sedation options and risks were discussed with the                        patient. All questions were answered and informed                        consent was obtained.                       - Patient identification and proposed procedure were                        verified prior to the procedure by the nurse. The                        procedure was verified in the procedure room.                       - ASA Grade Assessment: II - A patient with mild                        systemic disease.                       - After reviewing the risks and benefits, the patient                        was deemed in satisfactory condition to undergo the                        procedure.                       After obtaining informed consent, the colonoscope was                        passed under direct vision. Throughout the procedure,  the patient's blood pressure, pulse, and oxygen                        saturations were monitored continuously. The                        Colonoscope was introduced through the anus and                        advanced to the the cecum, identified by appendiceal                         orifice and ileocecal valve. The colonoscopy was                        performed without difficulty. The patient tolerated the                        procedure well. The quality of the bowel preparation                        was excellent. The ileocecal valve, appendiceal                        orifice, and rectum were photographed. Findings:      The perianal and digital rectal examinations were normal. Pertinent       negatives include normal sphincter tone and no palpable rectal lesions.      Many small-mouthed diverticula were found in the sigmoid colon.      Non-bleeding internal hemorrhoids were found during retroflexion. The       hemorrhoids were Grade I (internal hemorrhoids that do not prolapse).      The exam was otherwise without abnormality. Impression:           - Diverticulosis in the sigmoid colon.                       - Non-bleeding internal hemorrhoids.                       - The examination was otherwise normal.                       - No specimens collected. Recommendation:       - Patient has a contact number available for                        emergencies. The signs and symptoms of potential                        delayed complications were discussed with the patient.                        Return to normal activities tomorrow. Written discharge                        instructions were provided to the patient.                       - Resume previous diet.                       -  Continue present medications.                       - No repeat colonoscopy due to age and the absence of                        advanced adenomas.                       - Return to GI office PRN.                       - The findings and recommendations were discussed with                        the patient and their family. Procedure Code(s):    --- Professional ---                       Q6761, Colorectal cancer screening; colonoscopy on                        individual at high  risk Diagnosis Code(s):    --- Professional ---                       K57.30, Diverticulosis of large intestine without                        perforation or abscess without bleeding                       K64.0, First degree hemorrhoids                       Z86.010, Personal history of colonic polyps CPT copyright 2018 American Medical Association. All rights reserved. The codes documented in this report are preliminary and upon coder review may  be revised to meet current compliance requirements. Efrain Sella MD, MD 10/11/2017 12:07:24 PM This report has been signed electronically. Number of Addenda: 0 Note Initiated On: 10/11/2017 11:43 AM Scope Withdrawal Time: 0 hours 6 minutes 11 seconds  Total Procedure Duration: 0 hours 15 minutes 15 seconds       [facility]

## 2017-10-11 NOTE — Interval H&P Note (Signed)
History and Physical Interval Note:  10/11/2017 11:42 AM  Natalie Gibbs  has presented today for surgery, with the diagnosis of PRS HX COLON POLYPS  The various methods of treatment have been discussed with the patient and family. After consideration of risks, benefits and other options for treatment, the patient has consented to  Procedure(s): COLONOSCOPY WITH PROPOFOL (N/A) as a surgical intervention .  The patient's history has been reviewed, patient examined, no change in status, stable for surgery.  I have reviewed the patient's chart and labs.  Questions were answered to the patient's satisfaction.     [place], [place]

## 2017-10-11 NOTE — Anesthesia Preprocedure Evaluation (Signed)
Anesthesia Evaluation  Patient identified by MRN, date of birth, ID band Patient awake    Reviewed: Allergy & Precautions, H&P , NPO status , Patient's Chart, lab work & pertinent test results  Airway Mallampati: III  TM Distance: <3 FB Neck ROM: limited    Dental  (+) Chipped, Poor Dentition, Upper Dentures   Pulmonary neg pulmonary ROS, neg shortness of breath,           Cardiovascular Exercise Tolerance: Good hypertension, (-) angina(-) Past MI and (-) DOE      Neuro/Psych negative neurological ROS  negative psych ROS   GI/Hepatic negative GI ROS, Neg liver ROS, neg GERD  ,  Endo/Other  diabetes, Type 2  Renal/GU negative Renal ROS  negative genitourinary   Musculoskeletal  (+) Arthritis ,   Abdominal   Peds  Hematology negative hematology ROS (+)   Anesthesia Other Findings Past Medical History: No date: Allergy No date: Arthritis No date: Diffuse cystic mastopathy No date: Glaucoma No date: Hypertension No date: Obesity, unspecified No date: Pre-diabetes No date: Special screening for malignant neoplasms, colon  Past Surgical History: 75 + yrs ago: BREAST BIOPSY; Left     Comment:  benign 2015: BREAST CYST ASPIRATION; Left     Comment:  neg 10/26/2016: BREAST DUCTAL SYSTEM EXCISION; Left     Comment:  Intraductal papilloma Surgeon: Christene Lye,               MD;  Location: [facility] ORS;  Service: General;  Laterality:               Left; 2017: CATARACT EXTRACTION, BILATERAL; Bilateral 2010: COLONOSCOPY     Comment:  Dr. Candace Cruise, [facility] 05/13/2014: COLONOSCOPY; N/A     Comment:  Procedure: COLONOSCOPY;  Surgeon: Hulen Luster, MD;                Location: [facility] ENDOSCOPY;  Service: Gastroenterology;                Laterality: N/A; 04/28/2016: COLONOSCOPY     Comment:  Dr. Jamal Collin. 1997: excision mass abdominal wall 2017: EYE SURGERY     Comment:  bilateral No date: TUBAL LIGATION  BMI    Body Mass  Index:  30.90 kg/m      Reproductive/Obstetrics negative OB ROS                             Anesthesia Physical Anesthesia Plan  ASA: III  Anesthesia Plan: General   Post-op Pain Management:    Induction: Intravenous  PONV Risk Score and Plan: Propofol infusion and TIVA  Airway Management Planned: Natural Airway and Nasal Cannula  Additional Equipment:   Intra-op Plan:   Post-operative Plan:   Informed Consent: I have reviewed the patients History and Physical, chart, labs and discussed the procedure including the risks, benefits and alternatives for the proposed anesthesia with the patient or authorized representative who has indicated his/her understanding and acceptance.   Dental Advisory Given  Plan Discussed with: Anesthesiologist, CRNA and Surgeon  Anesthesia Plan Comments: (Patient consented for risks of anesthesia including but not limited to:  - adverse reactions to medications - risk of intubation if required - damage to teeth, lips or other oral mucosa - sore throat or hoarseness - Damage to heart, brain, lungs or loss of life  Patient voiced understanding.)        Anesthesia Quick Evaluation

## 2017-10-11 NOTE — Anesthesia Post-op Follow-up Note (Signed)
Anesthesia QCDR form completed.        

## 2017-10-12 ENCOUNTER — Encounter: Payer: Self-pay | Admitting: Internal Medicine

## 2017-10-13 NOTE — Anesthesia Postprocedure Evaluation (Signed)
Anesthesia Post Note  Patient: Natalie Gibbs  Procedure(s) Performed: COLONOSCOPY WITH PROPOFOL (N/A )  Patient location during evaluation: Endoscopy Anesthesia Type: General Level of consciousness: awake and alert Pain management: pain level controlled Vital Signs Assessment: post-procedure vital signs reviewed and stable Respiratory status: spontaneous breathing, nonlabored ventilation, respiratory function stable and patient connected to nasal cannula oxygen Cardiovascular status: blood pressure returned to baseline and stable Postop Assessment: no apparent nausea or vomiting Anesthetic complications: no     Last Vitals:  Vitals:   10/11/17 1232 10/11/17 1243  BP: 96/64 110/68  Pulse: 72 71  Resp: 17 (!) 22  Temp:    SpO2: 99% 98%    Last Pain:  Vitals:   10/12/17 0739  TempSrc:   PainSc: 0-No pain                 Precious Haws Estevon Fluke

## 2017-11-08 ENCOUNTER — Other Ambulatory Visit: Payer: Self-pay | Admitting: Internal Medicine

## 2017-11-08 ENCOUNTER — Telehealth: Payer: Self-pay

## 2017-11-08 DIAGNOSIS — R928 Other abnormal and inconclusive findings on diagnostic imaging of breast: Secondary | ICD-10-CM

## 2017-11-08 NOTE — Telephone Encounter (Signed)
Patient needs repeat US and mammo ordered so she can schedule with [place].   Please Advise.

## 2017-11-08 NOTE — Telephone Encounter (Signed)
Order placed.  Natalie Gibbs will call her.

## 2017-11-08 NOTE — Telephone Encounter (Signed)
Patient informed. 

## 2017-11-09 DIAGNOSIS — H40153 Residual stage of open-angle glaucoma, bilateral: Secondary | ICD-10-CM | POA: Diagnosis not present

## 2017-12-06 ENCOUNTER — Ambulatory Visit
Admission: RE | Admit: 2017-12-06 | Discharge: 2017-12-06 | Disposition: A | Discharge status: Home / Self Care | Payer: Medicare HMO | Source: Ambulatory Visit | Attending: Internal Medicine | Admitting: Internal Medicine

## 2017-12-06 DIAGNOSIS — N6313 Unspecified lump in the right breast, lower outer quadrant: Secondary | ICD-10-CM | POA: Diagnosis not present

## 2017-12-06 DIAGNOSIS — R928 Other abnormal and inconclusive findings on diagnostic imaging of breast: Secondary | ICD-10-CM | POA: Insufficient documentation

## 2018-03-15 DIAGNOSIS — H40153 Residual stage of open-angle glaucoma, bilateral: Secondary | ICD-10-CM | POA: Diagnosis not present

## 2018-03-24 ENCOUNTER — Telehealth: Payer: Self-pay | Admitting: Internal Medicine

## 2018-03-24 NOTE — Telephone Encounter (Signed)
Spoke with patient and let her know we are cancelling her Medicare Wellness Visit for 03/27/2018 at 8:00 AM.  Patient voiced understood about the cancellation.  Janace Hoard, Care Guide.

## 2018-03-27 ENCOUNTER — Ambulatory Visit: Payer: Medicare HMO

## 2018-03-29 ENCOUNTER — Ambulatory Visit (INDEPENDENT_AMBULATORY_CARE_PROVIDER_SITE_OTHER): Payer: Medicare HMO

## 2018-03-29 DIAGNOSIS — Z1231 Encounter for screening mammogram for malignant neoplasm of breast: Secondary | ICD-10-CM | POA: Diagnosis not present

## 2018-03-29 DIAGNOSIS — Z0000 Encounter for general adult medical examination without abnormal findings: Secondary | ICD-10-CM

## 2018-03-29 DIAGNOSIS — Z780 Asymptomatic menopausal state: Secondary | ICD-10-CM

## 2018-03-29 NOTE — Progress Notes (Signed)
Subjective:   Natalie Gibbs is a 78 y.o. female who presents for an Initial Medicare Annual Wellness Visit.  This visit is being conducted via telephone due to the COVID-19 pandemic. This patient has given me verbal consent via telephone to conduct this visit. Some vital signs may be absent or patient reported.     Review of Systems      Cardiac Risk Factors include: advanced age (>31mn, >>12women);diabetes mellitus;hypertension     Objective:    There were no vitals filed for this visit. There is no height or weight on file to calculate BMI.  Advanced Directives 03/29/2018 10/11/2017 03/23/2017 10/26/2016 10/19/2016 03/10/2015 02/04/2015  Does Patient Have a Medical Advance Directive? Yes Yes Yes Yes Yes Yes Yes  Type of Advance Directive Living will;Healthcare Power of Attorney Living will HIsabellaLiving will Healthcare Power of [place] Does patient want to make changes to medical advance directive? - - - No - Patient declined No - Patient declined - -  Copy of [place]own[place] Chart? No - copy requested - No - copy requested No - copy requested No - copy requested - -    Current Medications (verified) Outpatient Encounter Medications as of 03/29/2018  Medication Sig   ACCU-CHEK SOFTCLIX LANCETS lancets 1 each by Other route daily. Use as instructed   Alcohol Swabs (B-D SINGLE USE SWABS REGULAR) PADS USE AS DIRECTED   aspirin 81 MG tablet Take 81 mg by mouth daily.   Blood Glucose Monitoring Suppl (ACCU-CHEK AVIVA PLUS) w/Device KIT USE  AS  INSTRUCTED   brimonidine (ALPHAGAN) 0.2 % ophthalmic solution Place 1 drop into both eyes 2 (two) times daily.    dorzolamide-timolol (COSOPT) 22.3-6.8 MG/ML ophthalmic solution Place 1 drop into both eyes 2 (two) times daily.   glucose blood (ACCU-CHEK AVIVA) test strip Test blood sugar once daily    latanoprost (XALATAN) 0.005 % ophthalmic solution Place 1 drop into both eyes at bedtime.    losartan (COZAAR) 25 MG tablet Take 1 tablet (25 mg total) by mouth daily.   brinzolamide (AZOPT) 1 % ophthalmic suspension 1 drop 2 (two) times daily.   [DISCONTINUED] travoprost, benzalkonium, (TRAVATAN) 0.004 % ophthalmic solution 1 drop at bedtime.   No facility-administered encounter medications on file as of 03/29/2018.     Allergies (verified) Iodine; Shellfish allergy; and Codeine   History: Past Medical History:  Diagnosis Date   Allergy    Arthritis    Diffuse cystic mastopathy    Glaucoma    Hypertension    Obesity, unspecified    Pre-diabetes    Special screening for malignant neoplasms, colon    Past Surgical History:  Procedure Laterality Date   BREAST BIOPSY Left 20 + yrs ago   benign   BREAST CYST ASPIRATION Left 2015   neg   BREAST DUCTAL SYSTEM EXCISION Left 10/26/2016   Intraductal papilloma Surgeon: SChristene Lye MD;  Location: [facility] ORS;  Service: General;  Laterality: Left;   CATARACT EXTRACTION, BILATERAL Bilateral 2017   [facility]  COLONOSCOPY N/A 05/13/2014   Procedure: COLONOSCOPY;  Surgeon: PHulen Luster MD;  Location: [facility] ENDOSCOPY;  Service: Gastroenterology;  Laterality: N/A;   COLONOSCOPY  04/28/2016   Dr. SJamal Collin   COLONOSCOPY WITH PROPOFOL N/A 10/11/2017   Procedure: COLONOSCOPY WITH PROPOFOL;  Surgeon: TAlice Reichert TBenay Pike MD;  Location:  [facility] ENDOSCOPY;  Service: Gastroenterology;  Laterality: N/A;   excision mass abdominal wall  1997   EYE SURGERY  2017   bilateral   TUBAL LIGATION     Family History  Problem Relation Age of Onset   Healthy Mother    Emphysema Father    Breast cancer Other        niece/breast   Diabetes Sister    Diabetes Sister    Breast cancer Other        niece   Kidney failure Brother    Multiple sclerosis Daughter    Social History   Socioeconomic History     Marital status: Married    Spouse name: Not on file   Number of children: 4   Years of education: Not on file   Highest education level: 10th grade  Occupational History   Occupation: Retired    Fish farm manager: OTHER  Social Designer, fashion/clothing strain: Not hard at all   Food insecurity:    Worry: Never true    Inability: Never true   Transportation needs:    Medical: No    Non-medical: No  Tobacco Use   Smoking status: Never Smoker   Smokeless tobacco: Never Used   Tobacco comment: smoking cessation materials not required  Substance and Sexual Activity   Alcohol use: No    Alcohol/week: 0.0 standard drinks   Drug use: No   Sexual activity: Never  Lifestyle   Physical activity:    Days per week: 2 days    Minutes per session: 20 min   Stress: Only a little  Relationships   Social connections:    Talks on phone: More than three times a week    Gets together: More than three times a week    Attends religious service: More than 4 times per year    Active member of club or organization: No    Attends meetings of clubs or organizations: Never    Relationship status: Married  Other Topics Concern   Not on file  Social History Narrative   Not on file    Tobacco Counseling Counseling given: Not Answered Comment: smoking cessation materials not required   Clinical Intake:  Pre-visit preparation completed: Yes  Pain : No/denies pain     Nutritional Risks: None Diabetes: Yes CBG done?: No Did pt. bring in CBG monitor from home?: No   Nutrition Risk Assessment:  Has the patient had any N/V/D within the last 2 months?  No  Does the patient have any non-healing wounds?  No  Has the patient had any unintentional weight loss or weight gain?  No   Diabetes:  Is the patient diabetic?  Yes  If diabetic, was a CBG obtained today?  No  Did the patient bring in their glucometer from home?  No  How often do you monitor your CBG's? Daily fasting  in morning.   Financial Strains and Diabetes Management:  Are you having any financial strains with the device, your supplies or your medication? No .  Does the patient want to be seen by Chronic Care Management for management of their diabetes?  No  Would the patient like to be referred to a Nutritionist or for Diabetic Management?  No   Diabetic Exams:  Diabetic Eye Exam: Completed 08/30/17.   Diabetic Foot Exam: Completed 04/01/17. Pt has been advised about the importance in completing this exam. Pt is scheduled for diabetic foot exam on 04/03/18.   How often  do you need to have someone help you when you read instructions, pamphlets, or other written materials from your doctor or pharmacy?: 1 - Never What is the last grade level you completed in school?: 10th grade  Interpreter Needed?: No  Information entered by :: Clemetine Marker LPN   Activities of Daily Living In your present state of health, do you have any difficulty performing the following activities: 03/29/2018  Hearing? N  Comment declines hearing aids  Vision? N  Comment glasses  Difficulty concentrating or making decisions? N  Walking or climbing stairs? N  Dressing or bathing? N  Doing errands, shopping? N  Preparing Food and eating ? N  Using the Toilet? N  In the past six months, have you accidently leaked urine? Y  Comment wears liner for protection  Do you have problems with loss of bowel control? N  Managing your Medications? N  Managing your Finances? N  Housekeeping or managing your Housekeeping? N  Some recent data might be hidden     Immunizations and Health Maintenance Immunization History  Administered Date(s) Administered   Influenza,inj,Quad PF,6+ Mos 09/10/2015, 03/23/2017   Pneumococcal Conjugate-13 03/06/2014   Pneumococcal Polysaccharide-23 03/06/2008   Tdap 03/10/2015   There are no preventive care reminders to display for this patient.  Patient Care Team: Glean Hess, MD as  PCP - General (Internal Medicine) Bary Castilla Forest Gleason, MD as Consulting Physician ([facility]) [facility] ([facility]) Candace Cruise, Lupita Dawn, MD (Inactive) as Physician Assistant (Gastroenterology)  Indicate any recent Medical Services you may have received from other than [facility] providers in the past year (date may be approximate).     Assessment:   This is a routine wellness examination for Natalie Gibbs.  Hearing/Vision screen Hearing Screening Comments: Pt denies hearing difficulty Vision Screening Comments: Pt sees Dr. Gloriann Loan for annual eye exams   Dietary issues and exercise activities discussed: Current Exercise Habits: Home exercise routine, Type of exercise: calisthenics;stretching, Time (Minutes): 20, Frequency (Times/Week): 2, Weekly Exercise (Minutes/Week): 40, Intensity: Mild, Exercise limited by: None identified  Goals     DIET - INCREASE WATER INTAKE     Recommend to drink at least 6-8 8oz glasses of water per day.     Increase physical activity      Depression Screen PHQ 2/9 Scores 03/29/2018 03/23/2017 03/31/2016 02/04/2015  PHQ - 2 Score 0 0 0 0  PHQ- 9 Score - 0 - -    Fall Risk Fall Risk  03/29/2018 03/23/2017 03/31/2016 02/04/2015  Falls in the past year? 0 No No No  Number falls in past yr: 0 - - -  Injury with Fall? 0 - - -  Risk for fall due to : - Impaired vision - -  Risk for fall due to: Comment - wears reading glasses - -  Follow up Falls prevention discussed - - -    FALL RISK PREVENTION PERTAINING TO THE HOME:  Any stairs in or around the home? Yes  If so, do they handrails? Yes   Home free of loose throw rugs in walkways, pet beds, electrical cords, etc? Yes  Adequate lighting in your home to reduce risk of falls? Yes   ASSISTIVE DEVICES UTILIZED TO PREVENT FALLS:  Life alert? No  Use of a cane, walker or w/c? No  Grab bars in the bathroom? No  Shower chair or bench in shower? Yes  Elevated toilet seat or a handicapped toilet? Yes    DME ORDERS:  DME order  needed?  No   TIMED UP AND GO:  Was the test performed? No . Telephonic visit Length of time to ambulate 10 feet:  sec.    Education: Fall risk prevention has been discussed.  Intervention(s) required? No   Cognitive Function:     6CIT Screen 03/29/2018 03/23/2017 03/31/2016  What Year? 0 points 0 points 0 points  What month? 0 points 0 points 0 points  What time? 0 points 3 points 0 points  Count back from 20 0 points 0 points 0 points  Months in reverse 4 points 0 points 0 points  Repeat phrase 2 points 8 points 2 points  Total Score 6 11 2     Screening Tests Health Maintenance  Topic Date Due   INFLUENZA VACCINE  09/05/2018 (Originally 08/04/2017)   OPHTHALMOLOGY EXAM  03/31/2018   FOOT EXAM  04/02/2018   HEMOGLOBIN A1C  04/03/2018   MAMMOGRAM  04/13/2018   COLONOSCOPY  10/12/2022   TETANUS/TDAP  03/09/2025   DEXA SCAN  Completed   PNA vac Low Risk Adult  Completed    Qualifies for Shingles Vaccine? Yes  . Due for Shingrix. Education has been provided regarding the importance of this vaccine. Pt has been advised to call insurance company to determine out of pocket expense. Advised may also receive vaccine at local pharmacy or Health Dept. Verbalized acceptance and understanding.  Tdap: Up to date  Flu Vaccine: Due for Flu vaccine. Does the patient want to receive this vaccine today?  No . Education has been provided regarding the importance of this vaccine but still declined. Advised may receive this vaccine at local pharmacy or Health Dept. Aware to provide a copy of the vaccination record if obtained from local pharmacy or Health Dept. Verbalized acceptance and understanding.  Pneumococcal Vaccine: Up to date   Cancer Screenings:  Colorectal Screening: Completed 10/11/17. No longer required.    Mammogram: Completed 04/12/17. Repeat every year;  Ordered today. Pt provided with contact information and advised to call to schedule appt.    Bone Density: Completed 02/04/10. Results reflect  OSTEOPENIA. Repeat every 2 years. Ordered today. Pt provided with contact information and advised to call to schedule appt.   Lung Cancer Screening: (Low Dose CT Chest recommended if Age 25-80 years, 30 pack-year currently smoking OR have quit w/in 15years.) does not qualify.   Additional Screening:  Hepatitis C Screening: no longer required  Vision Screening: Recommended annual ophthalmology exams for early detection of glaucoma and other disorders of the eye. Is the patient up to date with their annual eye exam?  Yes  Who is the provider or what is the name of the office in which the pt attends annual eye exams? Dr. Gloriann Loan  Dental Screening: Recommended annual dental exams for proper oral hygiene  Community Resource Referral:  CRR required this visit?  No      Plan:    I have personally reviewed and addressed the Medicare Annual Wellness questionnaire and have noted the following in the patients chart:  A. Medical and social history B. Use of alcohol, tobacco or illicit drugs  C. Current medications and supplements D. Functional ability and status E.  Nutritional status F.  Physical activity G. Advance directives H. List of other physicians I.  Hospitalizations, surgeries, and ER visits in previous 12 months J.  [place] such as hearing and vision if needed, cognitive and depression L. Referrals and appointments   In addition, I have reviewed and discussed with patient  certain preventive protocols, quality metrics, and best practice recommendations. A written personalized care plan for preventive services as well as general preventive health recommendations were provided to patient.   Signed,  Clemetine Marker, LPN Nurse Health Advisor   Nurse Notes: pt doing well and appreciative of telephonic visit today

## 2018-03-29 NOTE — Patient Instructions (Signed)
Natalie Gibbs , Thank you for taking time to come for your Medicare Wellness Visit. I appreciate your ongoing commitment to your health goals. Please review the following plan we discussed and let me know if I can assist you in the future.   Screening recommendations/referrals: Colonoscopy: done 10/11/17.  Mammogram: done 04/12/17. Please call [phone] to schedule your mammogram and bone density screening.  Bone Density: done 02/04/10 Recommended yearly ophthalmology/optometry visit for glaucoma screening and checkup Recommended yearly dental visit for hygiene and checkup  Vaccinations: Influenza vaccine: postponed Pneumococcal vaccine: done 03/06/14  Tdap vaccine: done 03/10/15 Shingles vaccine: Shingrix discussed. Please contact your pharmacy for coverage information.   Advanced directives: Please bring a copy of your health care power of attorney and living will to the office at your convenience.  Conditions/risks identified: Keep up the great work!  Next appointment: Please follow up in one year for your Medicare Annual Wellness visit.     Preventive Care 76 Years and Older, Female Preventive care refers to lifestyle choices and visits with your health care provider that can promote health and wellness. What does preventive care include?  A yearly physical exam. This is also called an annual well check.  Dental exams once or twice a year.  Routine eye exams. Ask your health care provider how often you should have your eyes checked.  Personal lifestyle choices, including:  Daily care of your teeth and gums.  Regular physical activity.  Eating a healthy diet.  Avoiding tobacco and drug use.  Limiting alcohol use.  Practicing safe sex.  Taking low-dose aspirin every day.  Taking vitamin and mineral supplements as recommended by your health care provider. What happens during an annual well check? The services and screenings done by your health care provider during your  annual well check will depend on your age, overall health, lifestyle risk factors, and family history of disease. Counseling  Your health care provider may ask you questions about your:  Alcohol use.  Tobacco use.  Drug use.  Emotional well-being.  Home and relationship well-being.  Sexual activity.  Eating habits.  History of falls.  Memory and ability to understand (cognition).  Work and work Statistician.  Reproductive health. Screening  You may have the following tests or measurements:  Height, weight, and BMI.  Blood pressure.  Lipid and cholesterol levels. These may be checked every 5 years, or more frequently if you are over 72 years old.  Skin check.  Lung cancer screening. You may have this screening every year starting at age 40 if you have a 30-pack-year history of smoking and currently smoke or have quit within the past 15 years.  Fecal occult blood test (FOBT) of the stool. You may have this test every year starting at age 68.  Flexible sigmoidoscopy or colonoscopy. You may have a sigmoidoscopy every 5 years or a colonoscopy every 10 years starting at age 62.  Hepatitis C blood test.  Hepatitis B blood test.  Sexually transmitted disease (STD) testing.  Diabetes screening. This is done by checking your blood sugar (glucose) after you have not eaten for a while (fasting). You may have this done every 1-3 years.  Bone density scan. This is done to screen for osteoporosis. You may have this done starting at age 58.  Mammogram. This may be done every 1-2 years. Talk to your health care provider about how often you should have regular mammograms. Talk with your health care provider about your test results, treatment options, and  if necessary, the need for more tests. Vaccines  Your health care provider may recommend certain vaccines, such as:  Influenza vaccine. This is recommended every year.  Tetanus, diphtheria, and acellular pertussis (Tdap, Td)  vaccine. You may need a Td booster every 10 years.  Zoster vaccine. You may need this after age 76.  Pneumococcal 13-valent conjugate (PCV13) vaccine. One dose is recommended after age 76.  Pneumococcal polysaccharide (PPSV23) vaccine. One dose is recommended after age 40. Talk to your health care provider about which screenings and vaccines you need and how often you need them. This information is not intended to replace advice given to you by your health care provider. Make sure you discuss any questions you have with your health care provider. Document Released: 01/17/2015 Document Revised: 09/10/2015 Document Reviewed: 10/22/2014 Elsevier Interactive Patient Education  2017 [employer] Prevention in the Home Falls can cause injuries. They can happen to people of all ages. There are many things you can do to make your home safe and to help prevent falls. What can I do on the outside of my home?  Regularly fix the edges of walkways and driveways and fix any cracks.  Remove anything that might make you trip as you walk through a door, such as a raised step or threshold.  Trim any bushes or trees on the path to your home.  Use bright outdoor lighting.  Clear any walking paths of anything that might make someone trip, such as rocks or tools.  Regularly check to see if handrails are loose or broken. Make sure that both sides of any steps have handrails.  Any raised decks and porches should have guardrails on the edges.  Have any leaves, snow, or ice cleared regularly.  Use sand or salt on walking paths during winter.  Clean up any spills in your garage right away. This includes oil or grease spills. What can I do in the bathroom?  Use night lights.  Install grab bars by the toilet and in the tub and shower. Do not use towel bars as grab bars.  Use non-skid mats or decals in the tub or shower.  If you need to sit down in the shower, use a plastic, non-slip stool.   Keep the floor dry. Clean up any water that spills on the floor as soon as it happens.  Remove soap buildup in the tub or shower regularly.  Attach bath mats securely with double-sided non-slip rug tape.  Do not have throw rugs and other things on the floor that can make you trip. What can I do in the bedroom?  Use night lights.  Make sure that you have a light by your bed that is easy to reach.  Do not use any sheets or blankets that are too big for your bed. They should not hang down onto the floor.  Have a firm chair that has side arms. You can use this for support while you get dressed.  Do not have throw rugs and other things on the floor that can make you trip. What can I do in the kitchen?  Clean up any spills right away.  Avoid walking on wet floors.  Keep items that you use a lot in easy-to-reach places.  If you need to reach something above you, use a strong step stool that has a grab bar.  Keep electrical cords out of the way.  Do not use floor polish or wax that makes floors slippery. If you  must use wax, use non-skid floor wax.  Do not have throw rugs and other things on the floor that can make you trip. What can I do with my stairs?  Do not leave any items on the stairs.  Make sure that there are handrails on both sides of the stairs and use them. Fix handrails that are broken or loose. Make sure that handrails are as long as the stairways.  Check any carpeting to make sure that it is firmly attached to the stairs. Fix any carpet that is loose or worn.  Avoid having throw rugs at the top or bottom of the stairs. If you do have throw rugs, attach them to the floor with carpet tape.  Make sure that you have a light switch at the top of the stairs and the bottom of the stairs. If you do not have them, ask someone to add them for you. What else can I do to help prevent falls?  Wear shoes that:  Do not have high heels.  Have rubber bottoms.  Are  comfortable and fit you well.  Are closed at the toe. Do not wear sandals.  If you use a stepladder:  Make sure that it is fully opened. Do not climb a closed stepladder.  Make sure that both sides of the stepladder are locked into place.  Ask someone to hold it for you, if possible.  Clearly mark and make sure that you can see:  Any grab bars or handrails.  First and last steps.  Where the edge of each step is.  Use tools that help you move around (mobility aids) if they are needed. These include:  Canes.  Walkers.  Scooters.  Crutches.  Turn on the lights when you go into a dark area. Replace any light bulbs as soon as they burn out.  Set up your furniture so you have a clear path. Avoid moving your furniture around.  If any of your floors are uneven, fix them.  If there are any pets around you, be aware of where they are.  Review your medicines with your doctor. Some medicines can make you feel dizzy. This can increase your chance of falling. Ask your doctor what other things that you can do to help prevent falls. This information is not intended to replace advice given to you by your health care provider. Make sure you discuss any questions you have with your health care provider. Document Released: 10/17/2008 Document Revised: 05/29/2015 Document Reviewed: 01/25/2014 Elsevier Interactive Patient Education  2017 [employer].

## 2018-04-03 ENCOUNTER — Encounter: Payer: Self-pay | Admitting: Internal Medicine

## 2018-04-03 ENCOUNTER — Ambulatory Visit (INDEPENDENT_AMBULATORY_CARE_PROVIDER_SITE_OTHER): Payer: Medicare HMO | Admitting: Internal Medicine

## 2018-04-03 ENCOUNTER — Other Ambulatory Visit: Payer: Self-pay

## 2018-04-03 VITALS — BP 118/78 | HR 64 | Resp 16 | Ht 64.0 in | Wt 185.0 lb

## 2018-04-03 DIAGNOSIS — E1129 Type 2 diabetes mellitus with other diabetic kidney complication: Secondary | ICD-10-CM

## 2018-04-03 DIAGNOSIS — R928 Other abnormal and inconclusive findings on diagnostic imaging of breast: Secondary | ICD-10-CM | POA: Diagnosis not present

## 2018-04-03 DIAGNOSIS — R809 Proteinuria, unspecified: Secondary | ICD-10-CM

## 2018-04-03 DIAGNOSIS — Z0000 Encounter for general adult medical examination without abnormal findings: Secondary | ICD-10-CM

## 2018-04-03 DIAGNOSIS — Z1231 Encounter for screening mammogram for malignant neoplasm of breast: Secondary | ICD-10-CM | POA: Diagnosis not present

## 2018-04-03 LAB — POCT URINALYSIS DIPSTICK
Bilirubin, UA: NEGATIVE
Glucose, UA: NEGATIVE
KETONES UA: NEGATIVE
Leukocytes, UA: NEGATIVE
Nitrite, UA: NEGATIVE
PH UA: 6 (ref 5.0–8.0)
PROTEIN UA: NEGATIVE
SPEC GRAV UA: 1.015 (ref 1.010–1.025)
Urobilinogen, UA: 0.2 E.U./dL

## 2018-04-03 NOTE — Addendum Note (Signed)
Addended by: Clista Bernhardt on: 04/03/2018 02:08 PM   Modules accepted: Orders

## 2018-04-03 NOTE — Progress Notes (Signed)
Date:  04/03/2018   Name:  Natalie Gibbs   DOB:  September 13, 1940   MRN:  [id number]   Chief Complaint: Annual Exam Natalie Gibbs is a 78 y.o. female who presents today for her Complete Annual Exam. She feels well. She reports exercising walking and exercise equipment at home. Shereports she is sleeping fairly well.  She stays busy at home caring for her disabled daughter with MS.  Mammogram last done 12/2017 - due for 6 mo right [place] and Bilateral Dx mammogram.  She denies breast lumps, tenderness, nipple discharge.  PPV-23 and Prevnar completed Colonoscopy 10/2017  Diabetes  She presents for her follow-up diabetic visit. She has type 2 diabetes mellitus. Her disease course has been stable. Pertinent negatives for hypoglycemia include no dizziness, headaches, nervousness/anxiousness or tremors. Pertinent negatives for diabetes include no chest pain, no fatigue, no polydipsia and no polyuria. Symptoms are stable. Current diabetic treatment includes diet. She is compliant with treatment most of the time. She is following a generally healthy diet. An ACE inhibitor/angiotensin II receptor blocker is being taken. Eye exam is current.   Lab Results  Component Value Date   HGBA1C 5.8 (H) 10/03/2017   Lab Results  Component Value Date   CREATININE 0.71 10/03/2017   BUN 9 10/03/2017   NA 140 10/03/2017   K 4.7 10/03/2017   CL 102 10/03/2017   CO2 24 10/03/2017     Review of Systems  Constitutional: Negative for chills, fatigue and fever.  HENT: Negative for congestion, hearing loss, tinnitus, trouble swallowing and voice change.   Eyes: Negative for visual disturbance.  Respiratory: Negative for cough, chest tightness, shortness of breath and wheezing.   Cardiovascular: Negative for chest pain, palpitations and leg swelling.  Gastrointestinal: Negative for abdominal pain, constipation, diarrhea and vomiting.  Endocrine: Negative for polydipsia and polyuria.   Genitourinary: Negative for dysuria, frequency, genital sores, vaginal bleeding and vaginal discharge.  Musculoskeletal: Negative for arthralgias, gait problem and joint swelling.  Skin: Negative for color change and rash.  Neurological: Negative for dizziness, tremors, light-headedness and headaches.  Hematological: Negative for adenopathy. Does not bruise/bleed easily.  Psychiatric/Behavioral: Negative for dysphoric mood and sleep disturbance. The patient is not nervous/anxious.     Patient Active Problem List   Diagnosis Date Noted  . Abnormal mammogram of right breast 04/19/2017  . Intraductal papilloma of breast 10/27/2016  . Endometrial hyperplasia 06/14/2016  . Type 2 diabetes mellitus with microalbuminuria, without long-term current use of insulin ([place]) 05/15/2014  . Allergic state 05/15/2014  . Fibrocystic breast 05/15/2014  . Glaucoma 05/15/2014    Allergies  Allergen Reactions  . Iodine Itching  . Shellfish Allergy Itching  . Codeine Rash    Past Surgical History:  Procedure Laterality Date  . BREAST BIOPSY Left 20 + yrs ago   benign  . BREAST CYST ASPIRATION Left 2015   neg  . BREAST DUCTAL SYSTEM EXCISION Left 10/26/2016   Intraductal papilloma Surgeon: Christene Lye, MD;  Location: [facility] ORS;  Service: General;  Laterality: Left;  . CATARACT EXTRACTION, BILATERAL Bilateral 2017  . COLONOSCOPY  2010   Dr. Candace Cruise, [facility]  . COLONOSCOPY N/A 05/13/2014   Procedure: COLONOSCOPY;  Surgeon: Hulen Luster, MD;  Location: [facility] ENDOSCOPY;  Service: Gastroenterology;  Laterality: N/A;  . COLONOSCOPY  04/28/2016   Dr. Jamal Collin.  . COLONOSCOPY WITH PROPOFOL N/A 10/11/2017   Procedure: COLONOSCOPY WITH PROPOFOL;  Surgeon: Toledo, Benay Pike, MD;  Location: [facility] ENDOSCOPY;  Service: Gastroenterology;  Laterality: N/A;  . excision mass abdominal wall  1997  . EYE SURGERY  2017   bilateral  . TUBAL LIGATION      Social History   Tobacco Use  . Smoking status: Never Smoker   . Smokeless tobacco: Never Used  . Tobacco comment: smoking cessation materials not required  Substance Use Topics  . Alcohol use: No    Alcohol/week: 0.0 standard drinks  . Drug use: No     Medication list has been reviewed and updated.  Current Meds  Medication Sig  . ACCU-CHEK SOFTCLIX LANCETS lancets 1 each by Other route daily. Use as instructed  . Alcohol Swabs (B-D SINGLE USE SWABS REGULAR) PADS USE AS DIRECTED  . aspirin 81 MG tablet Take 81 mg by mouth daily.  . Blood Glucose Monitoring Suppl (ACCU-CHEK AVIVA PLUS) w/Device KIT USE  AS  INSTRUCTED  . brimonidine (ALPHAGAN) 0.2 % ophthalmic solution Place 1 drop into both eyes 2 (two) times daily.   . brinzolamide (AZOPT) 1 % ophthalmic suspension 1 drop 2 (two) times daily.  . dorzolamide-timolol (COSOPT) 22.3-6.8 MG/ML ophthalmic solution Place 1 drop into both eyes 2 (two) times daily.  [name] glucose blood (ACCU-CHEK AVIVA) test strip Test blood sugar once daily  . latanoprost (XALATAN) 0.005 % ophthalmic solution Place 1 drop into both eyes at bedtime.   [name] losartan (COZAAR) 25 MG tablet Take 1 tablet (25 mg total) by mouth daily.    PHQ 2/9 Scores 04/03/2018 03/29/2018 03/23/2017 03/31/2016  PHQ - 2 Score 0 0 0 0  PHQ- 9 Score 0 - 0 -    BP Readings from Last 3 Encounters:  04/03/18 118/78  10/11/17 110/68  10/03/17 128/70    Physical Exam Vitals signs and nursing note reviewed.  Constitutional:      General: She is not in acute distress.    Appearance: She is well-developed.  HENT:     Head: Normocephalic and atraumatic.     Right Ear: Tympanic membrane and ear canal normal.     Left Ear: Tympanic membrane and ear canal normal.     Nose:     Right Sinus: No maxillary sinus tenderness.     Left Sinus: No maxillary sinus tenderness.     Mouth/Throat:     Mouth: Mucous membranes are moist.     Pharynx: Uvula midline.  Eyes:     General: No scleral icterus.       Right eye: No discharge.        Left eye: No  discharge.     Conjunctiva/sclera: Conjunctivae normal.  Neck:     Musculoskeletal: Normal range of motion. No erythema.     Thyroid: No thyromegaly.     Vascular: No carotid bruit.  Cardiovascular:     Rate and Rhythm: Normal rate and regular rhythm.     Pulses: Normal pulses.     Heart sounds: Normal heart sounds.  Pulmonary:     Effort: Pulmonary effort is normal. No respiratory distress.     Breath sounds: No wheezing.  Chest:     Breasts:        Right: No mass, nipple discharge, skin change or tenderness.        Left: No mass, nipple discharge, skin change or tenderness.  Abdominal:     General: Bowel sounds are normal.     Palpations: Abdomen is soft.     Tenderness: There is no abdominal tenderness.  Musculoskeletal: Normal range of motion.  Right lower leg: No edema.     Left lower leg: No edema.  Lymphadenopathy:     Cervical: No cervical adenopathy.  Skin:    General: Skin is warm and dry.     Findings: No rash.     Comments: Multiple dark AK and SK over neck, chest, back  Neurological:     Mental Status: She is alert and oriented to person, place, and time.     Cranial Nerves: No cranial nerve deficit.     Sensory: No sensory deficit.     Deep Tendon Reflexes: Reflexes are normal and symmetric.  Psychiatric:        Speech: Speech normal.        Behavior: Behavior normal.        Thought Content: Thought content normal.     Wt Readings from Last 3 Encounters:  04/03/18 185 lb (83.9 kg)  10/11/17 180 lb (81.6 kg)  10/03/17 181 lb (82.1 kg)    BP 118/78   Pulse 64   Resp 16   Ht 5' 4"  (1.626 m)   Wt 185 lb (83.9 kg)   SpO2 96%   BMI 31.76 kg/m   Assessment and Plan: 1. Annual physical exam Normal exam except weight - POCT urinalysis dipstick  2. Encounter for screening mammogram for breast cancer Will send message to scheduling - MM Digital Diagnostic Bilat; Future  3. Type 2 diabetes mellitus with microalbuminuria, without long-term current  use of insulin (HCC) Continue diet, consider monitoring - CBC with Differential/Platelet - Comprehensive metabolic panel - Hemoglobin A1c - Lipid panel - TSH  4. Abnormal mammogram of right breast - US BREAST LTD UNI RIGHT INC AXILLA; Future - MM Digital Diagnostic Bilat; Future   Partially dictated using Editor, commissioning. Any errors are unintentional.  Halina Maidens, MD [facility]  04/03/2018

## 2018-04-04 LAB — COMPREHENSIVE METABOLIC PANEL
ALBUMIN: 4.1 g/dL (ref 3.7–4.7)
ALK PHOS: 81 IU/L (ref 39–117)
ALT: 12 IU/L (ref 0–32)
AST: 14 IU/L (ref 0–40)
Albumin/Globulin Ratio: 1.1 — ABNORMAL LOW (ref 1.2–2.2)
BILIRUBIN TOTAL: 0.4 mg/dL (ref 0.0–1.2)
BUN / CREAT RATIO: 14 (ref 12–28)
BUN: 11 mg/dL (ref 8–27)
CHLORIDE: 103 mmol/L (ref 96–106)
CO2: 23 mmol/L (ref 20–29)
Calcium: 9.4 mg/dL (ref 8.7–10.3)
Creatinine, Ser: 0.81 mg/dL (ref 0.57–1.00)
GFR calc Af Amer: 82 mL/min/{1.73_m2} (ref >59)
GFR calc non Af Amer: 71 mL/min/{1.73_m2} (ref >59)
GLOBULIN, TOTAL: 3.6 g/dL (ref 1.5–4.5)
GLUCOSE: 103 mg/dL — AB (ref 65–99)
Potassium: 4.5 mmol/L (ref 3.5–5.2)
SODIUM: 141 mmol/L (ref 134–144)
Total Protein: 7.7 g/dL (ref 6.0–8.5)

## 2018-04-04 LAB — CBC WITH DIFFERENTIAL/PLATELET
Basophils Absolute: 0 10*3/uL (ref 0.0–0.2)
Basos: 1 %
EOS (ABSOLUTE): 0.1 10*3/uL (ref 0.0–0.4)
Eos: 3 %
Hematocrit: 39.7 % (ref 34.0–46.6)
Hemoglobin: 12.5 g/dL (ref 11.1–15.9)
Immature Grans (Abs): 0 10*3/uL (ref 0.0–0.1)
Immature Granulocytes: 0 %
Lymphocytes Absolute: 1.9 10*3/uL (ref 0.7–3.1)
Lymphs: 38 %
MCH: 28.3 pg (ref 26.6–33.0)
MCHC: 31.5 g/dL (ref 31.5–35.7)
MCV: 90 fL (ref 79–97)
Monocytes Absolute: 0.4 10*3/uL (ref 0.1–0.9)
Monocytes: 7 %
Neutrophils Absolute: 2.5 10*3/uL (ref 1.4–7.0)
Neutrophils: 51 %
Platelets: 255 10*3/uL (ref 150–450)
RBC: 4.41 x10E6/uL (ref 3.77–5.28)
RDW: 12.3 % (ref 11.7–15.4)
WBC: 4.9 10*3/uL (ref 3.4–10.8)

## 2018-04-04 LAB — HEMOGLOBIN A1C
ESTIMATED AVERAGE GLUCOSE: 120 mg/dL
HEMOGLOBIN A1C: 5.8 % — AB (ref 4.8–5.6)

## 2018-04-04 LAB — LIPID PANEL
CHOL/HDL RATIO: 3.7 ratio (ref 0.0–4.4)
Cholesterol, Total: 151 mg/dL (ref 100–199)
HDL: 41 mg/dL (ref >39)
LDL Calculated: 93 mg/dL (ref 0–99)
Triglycerides: 85 mg/dL (ref 0–149)
VLDL Cholesterol Cal: 17 mg/dL (ref 5–40)

## 2018-04-04 LAB — TSH: TSH: 2.22 u[IU]/mL (ref 0.450–4.500)

## 2018-05-06 ENCOUNTER — Other Ambulatory Visit: Payer: Self-pay | Admitting: Internal Medicine

## 2018-05-06 DIAGNOSIS — E1129 Type 2 diabetes mellitus with other diabetic kidney complication: Secondary | ICD-10-CM

## 2018-05-06 DIAGNOSIS — R809 Proteinuria, unspecified: Principal | ICD-10-CM

## 2018-05-18 ENCOUNTER — Other Ambulatory Visit: Payer: Self-pay | Admitting: Internal Medicine

## 2018-05-22 ENCOUNTER — Ambulatory Visit
Admission: RE | Admit: 2018-05-22 | Discharge: 2018-05-22 | Disposition: A | Discharge status: Home / Self Care | Payer: Medicare HMO | Source: Ambulatory Visit | Attending: Internal Medicine | Admitting: Internal Medicine

## 2018-05-22 ENCOUNTER — Other Ambulatory Visit: Payer: Self-pay

## 2018-05-22 DIAGNOSIS — R928 Other abnormal and inconclusive findings on diagnostic imaging of breast: Secondary | ICD-10-CM

## 2018-05-22 DIAGNOSIS — M85832 Other specified disorders of bone density and structure, left forearm: Secondary | ICD-10-CM | POA: Diagnosis not present

## 2018-05-22 DIAGNOSIS — N6313 Unspecified lump in the right breast, lower outer quadrant: Secondary | ICD-10-CM | POA: Diagnosis not present

## 2018-05-22 DIAGNOSIS — Z780 Asymptomatic menopausal state: Secondary | ICD-10-CM | POA: Diagnosis not present

## 2018-07-13 ENCOUNTER — Other Ambulatory Visit: Payer: Self-pay | Admitting: *Deleted

## 2018-07-13 DIAGNOSIS — Z20822 Contact with and (suspected) exposure to covid-19: Secondary | ICD-10-CM

## 2018-07-13 NOTE — Progress Notes (Signed)
LAB

## 2018-07-18 LAB — NOVEL CORONAVIRUS, NAA: SARS-CoV-2, NAA: NOT DETECTED

## 2018-07-25 DIAGNOSIS — H40153 Residual stage of open-angle glaucoma, bilateral: Secondary | ICD-10-CM | POA: Diagnosis not present

## 2018-10-04 ENCOUNTER — Ambulatory Visit: Payer: Medicare HMO | Admitting: Internal Medicine

## 2019-02-21 ENCOUNTER — Other Ambulatory Visit: Payer: Self-pay | Admitting: Internal Medicine

## 2019-02-21 DIAGNOSIS — E1129 Type 2 diabetes mellitus with other diabetic kidney complication: Secondary | ICD-10-CM

## 2019-03-27 DIAGNOSIS — H40153 Residual stage of open-angle glaucoma, bilateral: Secondary | ICD-10-CM | POA: Diagnosis not present

## 2019-03-27 DIAGNOSIS — S0500XA Injury of conjunctiva and corneal abrasion without foreign body, unspecified eye, initial encounter: Secondary | ICD-10-CM | POA: Diagnosis not present

## 2019-03-29 ENCOUNTER — Telehealth: Payer: Self-pay | Admitting: Internal Medicine

## 2019-03-29 NOTE — Telephone Encounter (Signed)
Left message for patient to call back and schedule Medicare Annual Wellness Visit (AWV) either virtually/audio only or in office. Whichever the patients preference is.  Last AWV 03/29/18; please schedule 03/30/19 OR AFTER with [facility] Ad[facility].

## 2019-04-05 ENCOUNTER — Telehealth: Payer: Self-pay

## 2019-04-05 ENCOUNTER — Ambulatory Visit (INDEPENDENT_AMBULATORY_CARE_PROVIDER_SITE_OTHER): Payer: Medicare HMO | Admitting: Internal Medicine

## 2019-04-05 ENCOUNTER — Other Ambulatory Visit: Payer: Self-pay | Admitting: Internal Medicine

## 2019-04-05 ENCOUNTER — Encounter: Payer: Self-pay | Admitting: Internal Medicine

## 2019-04-05 ENCOUNTER — Other Ambulatory Visit: Payer: Self-pay

## 2019-04-05 VITALS — BP 116/64 | HR 59 | Temp 97.3°F | Ht 64.0 in | Wt 186.0 lb

## 2019-04-05 DIAGNOSIS — R928 Other abnormal and inconclusive findings on diagnostic imaging of breast: Secondary | ICD-10-CM

## 2019-04-05 DIAGNOSIS — M8580 Other specified disorders of bone density and structure, unspecified site: Secondary | ICD-10-CM

## 2019-04-05 DIAGNOSIS — E118 Type 2 diabetes mellitus with unspecified complications: Secondary | ICD-10-CM

## 2019-04-05 DIAGNOSIS — Z0000 Encounter for general adult medical examination without abnormal findings: Secondary | ICD-10-CM | POA: Diagnosis not present

## 2019-04-05 DIAGNOSIS — Z1231 Encounter for screening mammogram for malignant neoplasm of breast: Secondary | ICD-10-CM | POA: Diagnosis not present

## 2019-04-05 LAB — POCT URINALYSIS DIPSTICK
Bilirubin, UA: NEGATIVE
Blood, UA: NEGATIVE
Glucose, UA: NEGATIVE
Ketones, UA: NEGATIVE
Leukocytes, UA: NEGATIVE
Nitrite, UA: NEGATIVE
Protein, UA: POSITIVE — AB
Spec Grav, UA: 1.015 (ref 1.010–1.025)
Urobilinogen, UA: 0.2 E.U./dL
pH, UA: 5 (ref 5.0–8.0)

## 2019-04-05 NOTE — Telephone Encounter (Signed)
Pt called to inform Dr Army Melia of the dates of her Covid Vaccine.  First dose was 01/31/2019 & second dose was 02/16/19.

## 2019-04-05 NOTE — Progress Notes (Signed)
Date:  04/05/2019   Name:  Natalie Gibbs   DOB:  [birth year]   MRN:  [id number]   Chief Complaint: Annual Exam (breast exam. foot exam. ) Natalie Gibbs is a 79 y.o. female who presents today for her Complete Annual Exam. She feels fairly well. She reports exercising some but taking care of her daughter full time. She reports she is sleeping well. She denies breast issues.  Mammogram  05/2018 DEXA  05/2018 osteopenia Colonoscopy  10/2017 Immunization History  Administered Date(s) Administered  . Influenza,inj,Quad PF,6+ Mos 09/10/2015, 03/23/2017  . Pneumococcal Conjugate-13 03/06/2014  . Pneumococcal Polysaccharide-23 03/06/2008  . Tdap 03/10/2015    Diabetes She presents for her follow-up diabetic visit. She has type 2 diabetes mellitus. Her disease course has been stable. There are no hypoglycemic associated symptoms. Pertinent negatives for hypoglycemia include no dizziness, headaches, nervousness/anxiousness or tremors. Pertinent negatives for diabetes include no chest pain, no fatigue, no foot paresthesias, no polydipsia, no polyuria and no weight loss. Symptoms are stable. Current diabetic treatment includes diet. She is compliant with treatment most of the time. An ACE inhibitor/angiotensin II receptor blocker is being taken. Eye exam is current.    Lab Results  Component Value Date   CREATININE 0.81 04/03/2018   BUN 11 04/03/2018   NA 141 04/03/2018   K 4.5 04/03/2018   CL 103 04/03/2018   CO2 23 04/03/2018   Lab Results  Component Value Date   CHOL 151 04/03/2018   HDL 41 04/03/2018   LDLCALC 93 04/03/2018   TRIG 85 04/03/2018   CHOLHDL 3.7 04/03/2018   Lab Results  Component Value Date   TSH 2.220 04/03/2018   Lab Results  Component Value Date   HGBA1C 5.8 (H) 04/03/2018   Lab Results  Component Value Date   WBC 4.9 04/03/2018   HGB 12.5 04/03/2018   HCT 39.7 04/03/2018   MCV 90 04/03/2018   PLT 255 04/03/2018   Lab Results   Component Value Date   ALT 12 04/03/2018   AST 14 04/03/2018   ALKPHOS 81 04/03/2018   BILITOT 0.4 04/03/2018     Review of Systems  Constitutional: Negative for chills, fatigue, fever and weight loss.  HENT: Negative for congestion, hearing loss, tinnitus, trouble swallowing and voice change.   Eyes: Negative for visual disturbance.  Respiratory: Negative for cough, chest tightness, shortness of breath and wheezing.   Cardiovascular: Negative for chest pain, palpitations and leg swelling.  Gastrointestinal: Negative for abdominal pain, constipation, diarrhea and vomiting.  Endocrine: Negative for polydipsia and polyuria.  Genitourinary: Negative for dysuria, frequency, genital sores, vaginal bleeding and vaginal discharge.  Musculoskeletal: Negative for arthralgias, gait problem and joint swelling.  Skin: Negative for color change and rash.  Neurological: Negative for dizziness, tremors, light-headedness and headaches.  Hematological: Negative for adenopathy. Does not bruise/bleed easily.  Psychiatric/Behavioral: Negative for dysphoric mood and sleep disturbance. The patient is not nervous/anxious.     Patient Active Problem List   Diagnosis Date Noted  . Osteopenia determined by x-ray 04/05/2019  . Abnormal mammogram of right breast 04/19/2017  . Intraductal papilloma of breast 10/27/2016  . Endometrial hyperplasia 06/14/2016  . Type II diabetes mellitus with complication ([place]) 47/65/4650  . Allergic state 05/15/2014  . Fibrocystic breast 05/15/2014  . Glaucoma 05/15/2014    Allergies  Allergen Reactions  . Iodine Itching  . Shellfish Allergy Itching  . Codeine Rash    Past Surgical History:  Procedure Laterality  Date  . BREAST BIOPSY Left 20 + yrs ago   benign  . BREAST CYST ASPIRATION Left 2015   neg  . BREAST DUCTAL SYSTEM EXCISION Left 10/26/2016   Intraductal papilloma Surgeon: Christene Lye, MD;  Location: [facility] ORS;  Service: General;  Laterality:  Left;  . CATARACT EXTRACTION, BILATERAL Bilateral 2017  . COLONOSCOPY  2010   Dr. Candace Cruise, [facility]  . COLONOSCOPY N/A 05/13/2014   Procedure: COLONOSCOPY;  Surgeon: Hulen Luster, MD;  Location: [facility] ENDOSCOPY;  Service: Gastroenterology;  Laterality: N/A;  . COLONOSCOPY  04/28/2016   Dr. Jamal Collin.  . COLONOSCOPY WITH PROPOFOL N/A 10/11/2017   Procedure: COLONOSCOPY WITH PROPOFOL;  Surgeon: Toledo, Benay Pike, MD;  Location: [facility] ENDOSCOPY;  Service: Gastroenterology;  Laterality: N/A;  . excision mass abdominal wall  1997  . EYE SURGERY  2017   bilateral  . TUBAL LIGATION      Social History   Tobacco Use  . Smoking status: Never Smoker  . Smokeless tobacco: Never Used  . Tobacco comment: smoking cessation materials not required  Substance Use Topics  . Alcohol use: No    Alcohol/week: 0.0 standard drinks  . Drug use: No     Medication list has been reviewed and updated.  Current Meds  Medication Sig  . ACCU-CHEK SOFTCLIX LANCETS lancets 1 each by Other route daily. Use as instructed  . Alcohol Swabs (B-D SINGLE USE SWABS REGULAR) PADS USE AS DIRECTED  . aspirin 81 MG tablet Take 81 mg by mouth daily.  . Blood Glucose Monitoring Suppl (ACCU-CHEK AVIVA PLUS) w/Device KIT USE  AS  INSTRUCTED  . brimonidine (ALPHAGAN) 0.2 % ophthalmic solution Place 1 drop into both eyes 2 (two) times daily.   . brinzolamide (AZOPT) 1 % ophthalmic suspension 1 drop 2 (two) times daily.  . calcium-vitamin D (OSCAL WITH D) 500-200 MG-UNIT tablet Take 1 tablet by mouth.  . dorzolamide-timolol (COSOPT) 22.3-6.8 MG/ML ophthalmic solution Place 1 drop into both eyes 2 (two) times daily.  [name] glucose blood (ACCU-CHEK AVIVA) test strip Test blood sugar once daily  . latanoprost (XALATAN) 0.005 % ophthalmic solution Place 1 drop into both eyes at bedtime.   [name] losartan (COZAAR) 25 MG tablet TAKE 1 TABLET EVERY DAY  . Travoprost, BAK Free, (TRAVATAN) 0.004 % SOLN ophthalmic solution 1 drop nightly.    PHQ 2/9 Scores  04/05/2019 04/03/2018 03/29/2018 03/23/2017  PHQ - 2 Score 0 0 0 0  PHQ- 9 Score 0 0 - 0    BP Readings from Last 3 Encounters:  04/05/19 116/64  04/03/18 118/78  10/11/17 110/68    Physical Exam Vitals and nursing note reviewed.  Constitutional:      General: She is not in acute distress.    Appearance: Normal appearance. She is well-developed.  HENT:     Head: Normocephalic and atraumatic.     Right Ear: Tympanic membrane and ear canal normal.     Left Ear: Tympanic membrane and ear canal normal.  Eyes:     General: No scleral icterus.       Right eye: No discharge.        Left eye: No discharge.     Conjunctiva/sclera: Conjunctivae normal.  Neck:     Thyroid: No thyromegaly.     Vascular: No carotid bruit.  Cardiovascular:     Rate and Rhythm: Normal rate and regular rhythm.     Pulses: Normal pulses.     Heart sounds: Normal heart sounds.  Pulmonary:  Effort: Pulmonary effort is normal. No respiratory distress.     Breath sounds: No wheezing.  Chest:     Breasts:        Right: No mass, nipple discharge, skin change or tenderness.        Left: No mass, nipple discharge, skin change or tenderness.  Abdominal:     General: Bowel sounds are normal.     Palpations: Abdomen is soft.     Tenderness: There is no abdominal tenderness.  Musculoskeletal:        General: Normal range of motion.     Cervical back: Normal range of motion. No erythema.     Right lower leg: No edema.     Left lower leg: No edema.  Lymphadenopathy:     Cervical: No cervical adenopathy.  Skin:    General: Skin is warm and dry.     Capillary Refill: Capillary refill takes less than 2 seconds.     Findings: No rash.     Comments: Many dark SK/AK scattered over chest, abdomen and back  Neurological:     General: No focal deficit present.     Mental Status: She is alert and oriented to person, place, and time.     Cranial Nerves: No cranial nerve deficit.     Sensory: No sensory deficit.      Deep Tendon Reflexes: Reflexes are normal and symmetric.  Psychiatric:        Speech: Speech normal.        Behavior: Behavior normal.        Thought Content: Thought content normal.     Wt Readings from Last 3 Encounters:  04/05/19 186 lb (84.4 kg)  04/03/18 185 lb (83.9 kg)  10/11/17 180 lb (81.6 kg)    BP 116/64   Pulse (!) 59   Temp (!) 97.3 F (36.3 C) (Temporal)   Ht 5' 4"  (1.626 m)   Wt 186 lb (84.4 kg)   SpO2 96%   BMI 31.93 kg/m   Assessment and Plan: 1. Annual physical exam Normal exam except for weight Continue healthy diet, exercise if able - POCT urinalysis dipstick  2. Encounter for screening mammogram for breast cancer Due for annual screening and right [place] - MM 3D SCREEN BREAST BILATERAL; Future  3. Type II diabetes mellitus with complication (HCC) Clinically stable by exam and report without s/s of hypoglycemia. DM complicated by obesity. Controlled with diet alone - CBC with Differential/Platelet - Comprehensive metabolic panel - Hemoglobin A1c - Lipid panel - TSH  4. Osteopenia determined by x-ray Continue calcium and vitamin D Check vitamin D level - VITAMIN D 25 Hydroxy (Vit-D Deficiency, Fractures)  5. Abnormal mammogram of right breast To be scheduled - US BREAST LTD UNI RIGHT INC AXILLA; Future   Partially dictated using Editor, commissioning. Any errors are unintentional.  Halina Maidens, MD [facility]  04/05/2019

## 2019-04-06 ENCOUNTER — Encounter: Payer: Self-pay | Admitting: Internal Medicine

## 2019-04-06 LAB — CBC WITH DIFFERENTIAL/PLATELET
Basophils Absolute: 0 10*3/uL (ref 0.0–0.2)
Basos: 1 %
EOS (ABSOLUTE): 0.2 10*3/uL (ref 0.0–0.4)
Eos: 5 %
Hematocrit: 38.1 % (ref 34.0–46.6)
Hemoglobin: 12 g/dL (ref 11.1–15.9)
Immature Grans (Abs): 0 10*3/uL (ref 0.0–0.1)
Immature Granulocytes: 0 %
Lymphocytes Absolute: 1.4 10*3/uL (ref 0.7–3.1)
Lymphs: 33 %
MCH: 28.2 pg (ref 26.6–33.0)
MCHC: 31.5 g/dL (ref 31.5–35.7)
MCV: 89 fL (ref 79–97)
Monocytes Absolute: 0.3 10*3/uL (ref 0.1–0.9)
Monocytes: 7 %
Neutrophils Absolute: 2.4 10*3/uL (ref 1.4–7.0)
Neutrophils: 54 %
Platelets: 250 10*3/uL (ref 150–450)
RBC: 4.26 x10E6/uL (ref 3.77–5.28)
RDW: 12.3 % (ref 11.7–15.4)
WBC: 4.4 10*3/uL (ref 3.4–10.8)

## 2019-04-06 LAB — COMPREHENSIVE METABOLIC PANEL
ALT: 13 IU/L (ref 0–32)
AST: 15 IU/L (ref 0–40)
Albumin/Globulin Ratio: 1.3 (ref 1.2–2.2)
Albumin: 4.2 g/dL (ref 3.7–4.7)
Alkaline Phosphatase: 86 IU/L (ref 39–117)
BUN/Creatinine Ratio: 15 (ref 12–28)
BUN: 12 mg/dL (ref 8–27)
Bilirubin Total: 0.4 mg/dL (ref 0.0–1.2)
CO2: 22 mmol/L (ref 20–29)
Calcium: 9.4 mg/dL (ref 8.7–10.3)
Chloride: 106 mmol/L (ref 96–106)
Creatinine, Ser: 0.79 mg/dL (ref 0.57–1.00)
GFR calc Af Amer: 84 mL/min/{1.73_m2} (ref >59)
GFR calc non Af Amer: 72 mL/min/{1.73_m2} (ref >59)
Globulin, Total: 3.3 g/dL (ref 1.5–4.5)
Glucose: 112 mg/dL — ABNORMAL HIGH (ref 65–99)
Potassium: 4.3 mmol/L (ref 3.5–5.2)
Sodium: 141 mmol/L (ref 134–144)
Total Protein: 7.5 g/dL (ref 6.0–8.5)

## 2019-04-06 LAB — LIPID PANEL
Chol/HDL Ratio: 3.6 ratio (ref 0.0–4.4)
Cholesterol, Total: 138 mg/dL (ref 100–199)
HDL: 38 mg/dL — ABNORMAL LOW (ref >39)
LDL Chol Calc (NIH): 85 mg/dL (ref 0–99)
Triglycerides: 78 mg/dL (ref 0–149)
VLDL Cholesterol Cal: 15 mg/dL (ref 5–40)

## 2019-04-06 LAB — TSH: TSH: 2.73 u[IU]/mL (ref 0.450–4.500)

## 2019-04-06 LAB — VITAMIN D 25 HYDROXY (VIT D DEFICIENCY, FRACTURES): Vit D, 25-Hydroxy: 31.6 ng/mL (ref 30.0–100.0)

## 2019-04-06 LAB — HEMOGLOBIN A1C
Est. average glucose Bld gHb Est-mCnc: 117 mg/dL
Hgb A1c MFr Bld: 5.7 % — ABNORMAL HIGH (ref 4.8–5.6)

## 2019-04-09 NOTE — Telephone Encounter (Signed)
Added this to her chart.  CM

## 2019-04-23 ENCOUNTER — Ambulatory Visit (INDEPENDENT_AMBULATORY_CARE_PROVIDER_SITE_OTHER): Payer: Medicare HMO

## 2019-04-23 DIAGNOSIS — Z0000 Encounter for general adult medical examination without abnormal findings: Secondary | ICD-10-CM | POA: Diagnosis not present

## 2019-04-23 NOTE — Patient Instructions (Signed)
Natalie Gibbs , Thank you for taking time to come for your Medicare Wellness Visit. I appreciate your ongoing commitment to your health goals. Please review the following plan we discussed and let me know if I can assist you in the future.   Screening recommendations/referrals: Colonoscopy: done 10/11/17 Mammogram: done 05/22/18. Scheduled for 05/23/19 Bone Density: done 05/22/18 Recommended yearly ophthalmology/optometry visit for glaucoma screening and checkup Recommended yearly dental visit for hygiene and checkup  Vaccinations: Influenza vaccine: postponed Pneumococcal vaccine: done 03/06/14 Tdap vaccine: done 03/10/15 Shingles vaccine: Shingrix discussed. Please contact your pharmacy for coverage information.  Covid-19: done 01/31/19 & 02/16/19  Advanced directives: Advance directive discussed with you today. I have provided a copy for you to complete at home and have notarized. Once this is complete please bring a copy in to our office so we can scan it into your chart.  Conditions/risks identified: Recommend increasing physical activity   Next appointment: Please follow up in one year for your Medicare Annual Wellness visit.     Preventive Care 77 Years and Older, Female Preventive care refers to lifestyle choices and visits with your health care provider that can promote health and wellness. What does preventive care include?  A yearly physical exam. This is also called an annual well check.  Dental exams once or twice a year.  Routine eye exams. Ask your health care provider how often you should have your eyes checked.  Personal lifestyle choices, including:  Daily care of your teeth and gums.  Regular physical activity.  Eating a healthy diet.  Avoiding tobacco and drug use.  Limiting alcohol use.  Practicing safe sex.  Taking low-dose aspirin every day.  Taking vitamin and mineral supplements as recommended by your health care provider. What happens during an  annual well check? The services and screenings done by your health care provider during your annual well check will depend on your age, overall health, lifestyle risk factors, and family history of disease. Counseling  Your health care provider may ask you questions about your:  Alcohol use.  Tobacco use.  Drug use.  Emotional well-being.  Home and relationship well-being.  Sexual activity.  Eating habits.  History of falls.  Memory and ability to understand (cognition).  Work and work Statistician.  Reproductive health. Screening  You may have the following tests or measurements:  Height, weight, and BMI.  Blood pressure.  Lipid and cholesterol levels. These may be checked every 5 years, or more frequently if you are over 69 years old.  Skin check.  Lung cancer screening. You may have this screening every year starting at age 74 if you have a 30-pack-year history of smoking and currently smoke or have quit within the past 15 years.  Fecal occult blood test (FOBT) of the stool. You may have this test every year starting at age 78.  Flexible sigmoidoscopy or colonoscopy. You may have a sigmoidoscopy every 5 years or a colonoscopy every 10 years starting at age 29.  Hepatitis C blood test.  Hepatitis B blood test.  Sexually transmitted disease (STD) testing.  Diabetes screening. This is done by checking your blood sugar (glucose) after you have not eaten for a while (fasting). You may have this done every 1-3 years.  Bone density scan. This is done to screen for osteoporosis. You may have this done starting at age 39.  Mammogram. This may be done every 1-2 years. Talk to your health care provider about how often you should have regular  mammograms. Talk with your health care provider about your test results, treatment options, and if necessary, the need for more tests. Vaccines  Your health care provider may recommend certain vaccines, such as:  Influenza  vaccine. This is recommended every year.  Tetanus, diphtheria, and acellular pertussis (Tdap, Td) vaccine. You may need a Td booster every 10 years.  Zoster vaccine. You may need this after age 43.  Pneumococcal 13-valent conjugate (PCV13) vaccine. One dose is recommended after age 64.  Pneumococcal polysaccharide (PPSV23) vaccine. One dose is recommended after age 39. Talk to your health care provider about which screenings and vaccines you need and how often you need them. This information is not intended to replace advice given to you by your health care provider. Make sure you discuss any questions you have with your health care provider. Document Released: 01/17/2015 Document Revised: 09/10/2015 Document Reviewed: 10/22/2014 Elsevier Interactive Patient Education  2017 [place] Prevention in the Home Falls can cause injuries. They can happen to people of all ages. There are many things you can do to make your home safe and to help prevent falls. What can I do on the outside of my home?  Regularly fix the edges of walkways and driveways and fix any cracks.  Remove anything that might make you trip as you walk through a door, such as a raised step or threshold.  Trim any bushes or trees on the path to your home.  Use bright outdoor lighting.  Clear any walking paths of anything that might make someone trip, such as rocks or tools.  Regularly check to see if handrails are loose or broken. Make sure that both sides of any steps have handrails.  Any raised decks and porches should have guardrails on the edges.  Have any leaves, snow, or ice cleared regularly.  Use sand or salt on walking paths during winter.  Clean up any spills in your garage right away. This includes oil or grease spills. What can I do in the bathroom?  Use night lights.  Install grab bars by the toilet and in the tub and shower. Do not use towel bars as grab bars.  Use non-skid mats or decals  in the tub or shower.  If you need to sit down in the shower, use a plastic, non-slip stool.  Keep the floor dry. Clean up any water that spills on the floor as soon as it happens.  Remove soap buildup in the tub or shower regularly.  Attach bath mats securely with double-sided non-slip rug tape.  Do not have throw rugs and other things on the floor that can make you trip. What can I do in the bedroom?  Use night lights.  Make sure that you have a light by your bed that is easy to reach.  Do not use any sheets or blankets that are too big for your bed. They should not hang down onto the floor.  Have a firm chair that has side arms. You can use this for support while you get dressed.  Do not have throw rugs and other things on the floor that can make you trip. What can I do in the kitchen?  Clean up any spills right away.  Avoid walking on wet floors.  Keep items that you use a lot in easy-to-reach places.  If you need to reach something above you, use a strong step stool that has a grab bar.  Keep electrical cords out of the way.  Do not use floor polish or wax that makes floors slippery. If you must use wax, use non-skid floor wax.  Do not have throw rugs and other things on the floor that can make you trip. What can I do with my stairs?  Do not leave any items on the stairs.  Make sure that there are handrails on both sides of the stairs and use them. Fix handrails that are broken or loose. Make sure that handrails are as long as the stairways.  Check any carpeting to make sure that it is firmly attached to the stairs. Fix any carpet that is loose or worn.  Avoid having throw rugs at the top or bottom of the stairs. If you do have throw rugs, attach them to the floor with carpet tape.  Make sure that you have a light switch at the top of the stairs and the bottom of the stairs. If you do not have them, ask someone to add them for you. What else can I do to help  prevent falls?  Wear shoes that:  Do not have high heels.  Have rubber bottoms.  Are comfortable and fit you well.  Are closed at the toe. Do not wear sandals.  If you use a stepladder:  Make sure that it is fully opened. Do not climb a closed stepladder.  Make sure that both sides of the stepladder are locked into place.  Ask someone to hold it for you, if possible.  Clearly mark and make sure that you can see:  Any grab bars or handrails.  First and last steps.  Where the edge of each step is.  Use tools that help you move around (mobility aids) if they are needed. These include:  Canes.  Walkers.  Scooters.  Crutches.  Turn on the lights when you go into a dark area. Replace any light bulbs as soon as they burn out.  Set up your furniture so you have a clear path. Avoid moving your furniture around.  If any of your floors are uneven, fix them.  If there are any pets around you, be aware of where they are.  Review your medicines with your doctor. Some medicines can make you feel dizzy. This can increase your chance of falling. Ask your doctor what other things that you can do to help prevent falls. This information is not intended to replace advice given to you by your health care provider. Make sure you discuss any questions you have with your health care provider. Document Released: 10/17/2008 Document Revised: 05/29/2015 Document Reviewed: 01/25/2014 Elsevier Interactive Patient Education  2017 [employer].

## 2019-04-23 NOTE — Progress Notes (Signed)
Subjective:   Natalie Gibbs is a 79 y.o. female who presents for Medicare Annual (Subsequent) preventive examination.  Virtual Visit via Telephone Note  I connected with Natalie Gibbs on 04/23/19 at  9:20 AM EDT by telephone and verified that I am speaking with the correct person using two identifiers.  Medicare Annual Wellness visit completed telephonically due to Covid-19 pandemic.   Location: Patient: home Provider: office   I discussed the limitations, risks, security and privacy concerns of performing an evaluation and management service by telephone and the availability of in person appointments. The patient expressed understanding and agreed to proceed.  Some vital signs may be absent or patient reported.   Clemetine Marker, LPN    Review of Systems:   Cardiac Risk Factors include: advanced age (>62men, >34 women);diabetes mellitus;hypertension     Objective:     Vitals: There were no vitals taken for this visit.  There is no height or weight on file to calculate BMI.  Advanced Directives 04/23/2019 03/29/2018 10/11/2017 03/23/2017 10/26/2016 10/19/2016 03/10/2015  Does Patient Have a Medical Advance Directive? No Yes Yes Yes Yes Yes Yes  Type of Advance Directive - Living will;Healthcare Power of Attorney Living will [place];Living will Healthcare Power of [place] of [place]  Does patient want to make changes to medical advance directive? - - - - No - Patient declined No - Patient declined -  Copy of [place] in Chart? - No - copy requested - No - copy requested No - copy requested No - copy requested -  Would patient like information on creating a medical advance directive? Yes (MAU/Ambulatory/Procedural Areas - Information given) - - - - - -    Tobacco Social History   Tobacco Use  Smoking Status Never Smoker  Smokeless Tobacco Never Used  Tobacco Comment   smoking cessation materials not required     Counseling given: Not Answered Comment: smoking cessation materials not required   Clinical Intake:  Pre-visit preparation completed: Yes  Pain : No/denies pain     Nutritional Risks: None Diabetes: No  How often do you need to have someone help you when you read instructions, pamphlets, or other written materials from your doctor or pharmacy?: 1 - Never  Interpreter Needed?: No  Information entered by :: Clemetine Marker LPN  Past Medical History:  Diagnosis Date  . Allergy   . Arthritis   . Diffuse cystic mastopathy   . Glaucoma   . Hypertension   . Obesity, unspecified   . Pre-diabetes   . Special screening for malignant neoplasms, colon    Past Surgical History:  Procedure Laterality Date  . BREAST BIOPSY Left 20 + yrs ago   benign  . BREAST CYST ASPIRATION Left 2015   neg  . BREAST DUCTAL SYSTEM EXCISION Left 10/26/2016   Intraductal papilloma Surgeon: Christene Lye, MD;  Location: [facility] ORS;  Service: General;  Laterality: Left;  . CATARACT EXTRACTION, BILATERAL Bilateral 2017  . COLONOSCOPY  2010   Dr. Candace Cruise, [facility]  . COLONOSCOPY N/A 05/13/2014   Procedure: COLONOSCOPY;  Surgeon: Hulen Luster, MD;  Location: [facility];  Service: Gastroenterology;  Laterality: N/A;  . COLONOSCOPY  04/28/2016   Dr. Jamal Collin.  . COLONOSCOPY WITH PROPOFOL N/A 10/11/2017   Procedure: COLONOSCOPY WITH PROPOFOL;  Surgeon: Toledo, Benay Pike, MD;  Location: [facility] ENDOSCOPY;  Service: Gastroenterology;  Laterality: N/A;  . excision mass abdominal  wall  1997  . EYE SURGERY  2017   bilateral  . TUBAL LIGATION     Family History  Problem Relation Age of Onset  . Healthy Mother   . Emphysema Father   . Breast cancer Other        niece/breast  . Diabetes Sister   . Diabetes Sister   . Breast cancer Other        niece  . Kidney failure Brother   . Multiple sclerosis Daughter    Social History   Socioeconomic History  . Marital  status: Married    Spouse name: Not on file  . Number of children: 4  . Years of education: Not on file  . Highest education level: 10th grade  Occupational History  . Occupation: Retired    Fish farm manager: OTHER  Tobacco Use  . Smoking status: Never Smoker  . Smokeless tobacco: Never Used  . Tobacco comment: smoking cessation materials not required  Substance and Sexual Activity  . Alcohol use: No    Alcohol/week: 0.0 standard drinks  . Drug use: No  . Sexual activity: Never  Other Topics Concern  . Not on file  Social History Narrative  . Not on file   Social Determinants of Health   Financial Resource Strain: Low Risk   . Difficulty of Paying Living Expenses: Not hard at all  Food Insecurity: No Food Insecurity  . Worried About Charity fundraiser in the Last Year: Never true  . Ran Out of Food in the Last Year: Never true  Transportation Needs: No Transportation Needs  . Lack of Transportation (Medical): No  . Lack of Transportation (Non-Medical): No  Physical Activity: Insufficiently Active  . Days of Exercise per Week: 2 days  . Minutes of Exercise per Session: 30 min  Stress: No Stress Concern Present  . Feeling of Stress : Not at all  Social Connections: Slightly Isolated  . Frequency of Communication with Friends and Family: More than three times a week  . Frequency of Social Gatherings with Friends and Family: More than three times a week  . Attends Religious Services: More than 4 times per year  . Active Member of Clubs or Organizations: No  . Attends Archivist Meetings: Never  . Marital Status: Married    Outpatient Encounter Medications as of 04/23/2019  Medication Sig  . ACCU-CHEK SOFTCLIX LANCETS lancets 1 each by Other route daily. Use as instructed  . Alcohol Swabs (B-D SINGLE USE SWABS REGULAR) PADS USE AS DIRECTED  . aspirin 81 MG tablet Take 81 mg by mouth daily.  . brimonidine (ALPHAGAN) 0.2 % ophthalmic solution Place 1 drop into both eyes  2 (two) times daily.   . brinzolamide (AZOPT) 1 % ophthalmic suspension 1 drop 2 (two) times daily.  . calcium-vitamin D (OSCAL WITH D) 500-200 MG-UNIT tablet Take 1 tablet by mouth.  . dorzolamide-timolol (COSOPT) 22.3-6.8 MG/ML ophthalmic solution Place 1 drop into both eyes 2 (two) times daily.  [name] glucose blood (ACCU-CHEK AVIVA) test strip Test blood sugar once daily  . latanoprost (XALATAN) 0.005 % ophthalmic solution Place 1 drop into both eyes at bedtime.   [name] losartan (COZAAR) 25 MG tablet TAKE 1 TABLET EVERY DAY  . [DISCONTINUED] Travoprost, BAK Free, (TRAVATAN) 0.004 % SOLN ophthalmic solution 1 drop nightly.   No facility-administered encounter medications on file as of 04/23/2019.    Activities of Daily Living In your present state of health, do you have any difficulty performing  the following activities: 04/23/2019 04/05/2019  Hearing? N N  Comment declines hearing aids -  Vision? N N  Difficulty concentrating or making decisions? N N  Walking or climbing stairs? N N  Dressing or bathing? N N  Doing errands, shopping? N N  Preparing Food and eating ? N -  Using the Toilet? N -  In the past six months, have you accidently leaked urine? Y -  Comment wears liners for protection -  Do you have problems with loss of bowel control? N -  Managing your Medications? N -  Managing your Finances? N -  Housekeeping or managing your Housekeeping? N -  Some recent data might be hidden    Patient Care Team: Glean Hess, MD as PCP - General (Internal Medicine) Bary Castilla, Forest Gleason, MD as Consulting Physician (General Surgery) Oh, Lupita Dawn, MD (Inactive) as Physician Assistant (Gastroenterology) Lorelee Cover., MD (Ophthalmology)    Assessment:   This is a routine wellness examination for Natalie Gibbs.  Exercise Activities and Dietary recommendations Current Exercise Habits: Home exercise routine, Type of exercise: treadmill;Other - see comments(exercise bike, rower), Time (Minutes):  30, Frequency (Times/Week): 2, Weekly Exercise (Minutes/Week): 60, Intensity: Moderate, Exercise limited by: None identified  Goals    . DIET - INCREASE WATER INTAKE     Recommend to drink at least 6-8 8oz glasses of water per day.    . Increase physical activity       Fall Risk Fall Risk  04/23/2019 04/05/2019 04/03/2018 03/29/2018 03/23/2017  Falls in the past year? 0 0 0 0 No  Number falls in past yr: 0 0 0 0 -  Injury with Fall? 0 0 0 0 -  Risk for fall due to : No Fall Risks No Fall Risks - - Impaired vision  Risk for fall due to: Comment - - - - wears reading glasses  Follow up Falls prevention discussed Falls evaluation completed - Falls prevention discussed -   FALL RISK PREVENTION PERTAINING TO THE HOME:  Any stairs in or around the home? Yes  If so, do they handrails? Yes   Home free of loose throw rugs in walkways, pet beds, electrical cords, etc? Yes  Adequate lighting in your home to reduce risk of falls? Yes   ASSISTIVE DEVICES UTILIZED TO PREVENT FALLS:  Life alert? No  Use of a cane, walker or w/c? No  Grab bars in the bathroom? No  Shower chair or bench in shower? Yes  Elevated toilet seat or a handicapped toilet? Yes   DME ORDERS:  DME order needed?  No   TIMED UP AND GO:  Was the test performed? No . Telephonic visit.   Education: Fall risk prevention has been discussed.  Intervention(s) required? No   Depression Screen PHQ 2/9 Scores 04/23/2019 04/05/2019 04/03/2018 03/29/2018  PHQ - 2 Score 0 0 0 0  PHQ- 9 Score - 0 0 -     Cognitive Function     6CIT Screen 04/23/2019 03/29/2018 03/23/2017 03/31/2016  What Year? 0 points 0 points 0 points 0 points  What month? 0 points 0 points 0 points 0 points  What time? 0 points 0 points 3 points 0 points  Count back from 20 0 points 0 points 0 points 0 points  Months in reverse 4 points 4 points 0 points 0 points  Repeat phrase 6 points 2 points 8 points 2 points  Total Score 10 6 11 2     Immunization  History  Administered Date(s) Administered  . Influenza,inj,Quad PF,6+ Mos 09/10/2015, 03/23/2017  . PFIZER SARS-COV-2 Vaccination 01/31/2019, 02/16/2019  . Pneumococcal Conjugate-13 03/06/2014  . Pneumococcal Polysaccharide-23 03/06/2008  . Tdap 03/10/2015    Qualifies for Shingles Vaccine? Yes  . Due for Shingrix. Education has been provided regarding the importance of this vaccine. Pt has been advised to call insurance company to determine out of pocket expense. Advised may also receive vaccine at local pharmacy or Health Dept. Verbalized acceptance and understanding.  Tdap: Up to date  Flu Vaccine: Due for Flu vaccine. Does the patient want to receive this vaccine today?  No . Education has been provided regarding the importance of this vaccine but still declined. Advised may receive this vaccine at local pharmacy or Health Dept. Aware to provide a copy of the vaccination record if obtained from local pharmacy or Health Dept. Verbalized acceptance and understanding.  Pneumococcal Vaccine: Up to date   Screening Tests Health Maintenance  Topic Date Due  . MAMMOGRAM  05/22/2019  . INFLUENZA VACCINE  08/05/2019  . COLONOSCOPY  10/12/2022  . TETANUS/TDAP  03/09/2025  . DEXA SCAN  Completed  . COVID-19 Vaccine  Completed  . PNA vac Low Risk Adult  Completed    Cancer Screenings:  Colorectal Screening: Completed 10/11/17. Repeat every 5 years;   Mammogram: Completed 05/22/18. Repeat every year; scheduled for 05/23/19  Bone Density: Completed 05/22/18. Results reflect  OSTEOPENIA. Repeat every 2 years.   Lung Cancer Screening: (Low Dose CT Chest recommended if Age 54-80 years, 30 pack-year currently smoking OR have quit w/in 15years.) does not qualify.    Additional Screening:  Hepatitis C Screening: no longer required  Vision Screening: Recommended annual ophthalmology exams for early detection of glaucoma and other disorders of the eye. Is the patient up to date with their  annual eye exam?  Yes  Who is the provider or what is the name of the office in which the pt attends annual eye exams? Dr. Gloriann Loan  Dental Screening: Recommended annual dental exams for proper oral hygiene  Community Resource Referral:  CRR required this visit?  No      Plan:     I have personally reviewed and addressed the Medicare Annual Wellness questionnaire and have noted the following in the patient's chart:  A. Medical and social history B. Use of alcohol, tobacco or illicit drugs  C. Current medications and supplements D. Functional ability and status E.  Nutritional status F.  Physical activity G. Advance directives H. List of other physicians I.  Hospitalizations, surgeries, and ER visits in previous 12 months J.  [place] such as hearing and vision if needed, cognitive and depression L. Referrals and appointments   In addition, I have reviewed and discussed with patient certain preventive protocols, quality metrics, and best practice recommendations. A written personalized care plan for preventive services as well as general preventive health recommendations were provided to patient.   Signed,  Clemetine Marker, LPN Nurse Health Advisor   Nurse Notes: none

## 2019-05-23 ENCOUNTER — Inpatient Hospital Stay: Admission: RE | Admit: 2019-05-23 | Payer: Medicare HMO | Source: Ambulatory Visit

## 2019-06-01 ENCOUNTER — Ambulatory Visit
Admission: RE | Admit: 2019-06-01 | Discharge: 2019-06-01 | Disposition: A | Discharge status: Home / Self Care | Payer: Medicare HMO | Source: Ambulatory Visit | Attending: Internal Medicine | Admitting: Internal Medicine

## 2019-06-01 DIAGNOSIS — R928 Other abnormal and inconclusive findings on diagnostic imaging of breast: Secondary | ICD-10-CM | POA: Insufficient documentation

## 2019-06-01 DIAGNOSIS — Z1231 Encounter for screening mammogram for malignant neoplasm of breast: Secondary | ICD-10-CM | POA: Diagnosis present

## 2019-08-07 ENCOUNTER — Other Ambulatory Visit: Payer: Self-pay

## 2019-08-07 ENCOUNTER — Telehealth: Payer: Self-pay | Admitting: Internal Medicine

## 2019-08-07 MED ORDER — GLUCOSE BLOOD VI STRP: ORAL_STRIP | 12 refills | Status: AC

## 2019-08-07 MED ORDER — ACCU-CHEK SOFTCLIX LANCETS MISC: 1.0000 | Freq: Every day | 12 refills | Status: DC

## 2019-08-07 NOTE — Telephone Encounter (Signed)
Patient's pharmacy requesting new RX for humana test strips and true plus 33 gauge lancets.

## 2019-08-07 NOTE — Telephone Encounter (Signed)
Refilled to [facility].  CM

## 2019-08-08 NOTE — Telephone Encounter (Signed)
Pt also requesting True metrix meter, test strips and lancets/ please advise   Pharmacy advise Accu Chek meter is discontinued

## 2019-08-09 ENCOUNTER — Other Ambulatory Visit: Payer: Self-pay

## 2019-08-09 MED ORDER — BLOOD GLUCOSE METER KIT: PACK | 0 refills | Status: DC

## 2019-08-09 NOTE — Telephone Encounter (Signed)
Sent new generic glucose meter kit and supplies to [facility] order. Asked that they dispense based on insurance coverage so patient gets a covered meter.  CM

## 2019-10-05 IMAGING — MG DIGITAL SCREENING BILATERAL MAMMOGRAM WITH TOMO AND CAD
8 of 11 series · 8 of 27 positions shown · non-contrast
Comparison: Previous exam(s).

CLINICAL DATA: Screening.

EXAM:
DIGITAL SCREENING BILATERAL MAMMOGRAM WITH TOMO AND CAD

[L MLO]
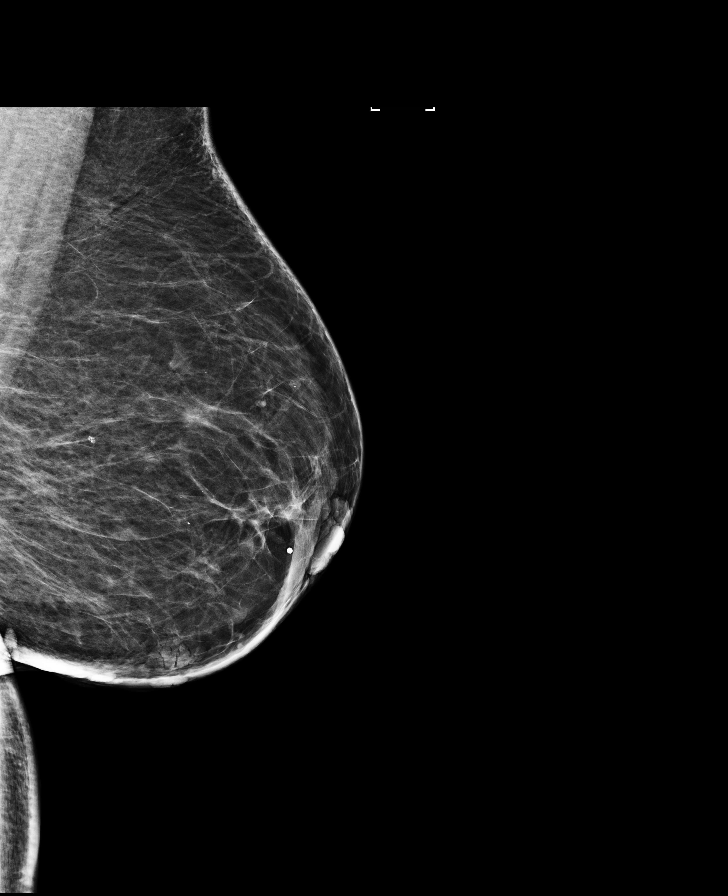

[R CC]
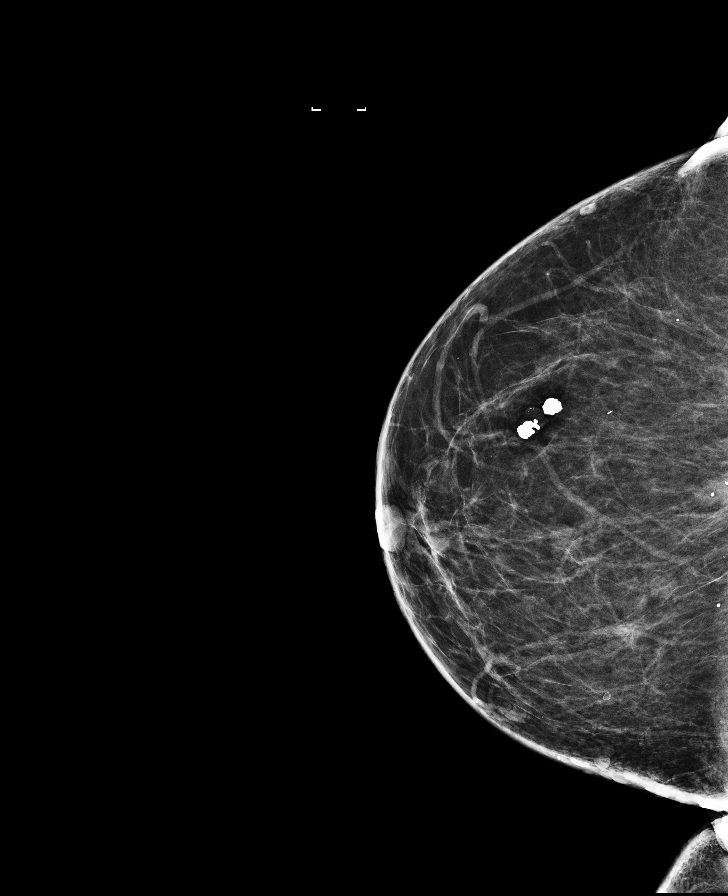

[L MLO synth-2D]
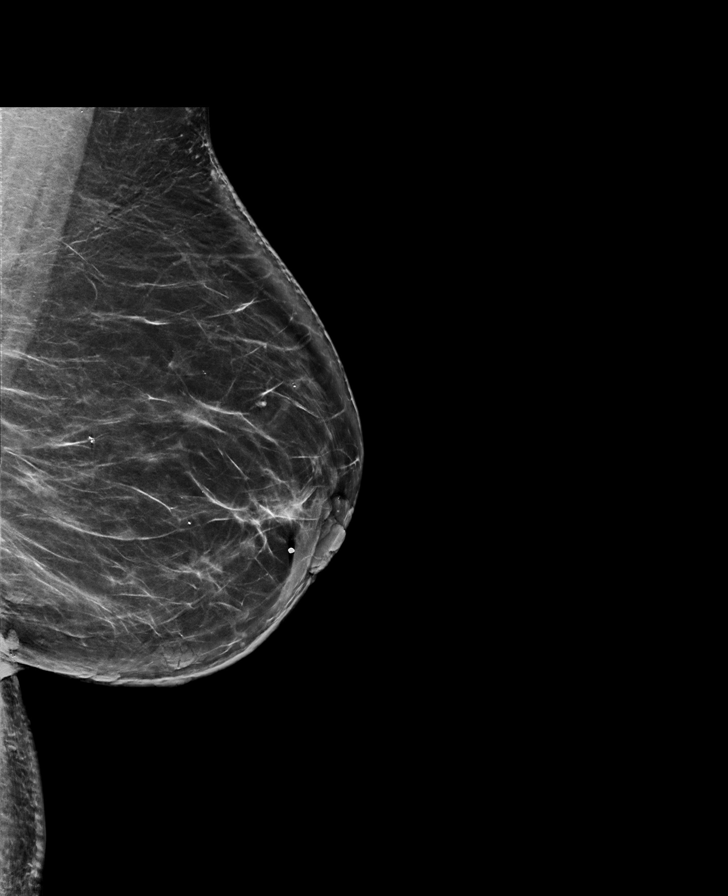

[R CC synth-2D]
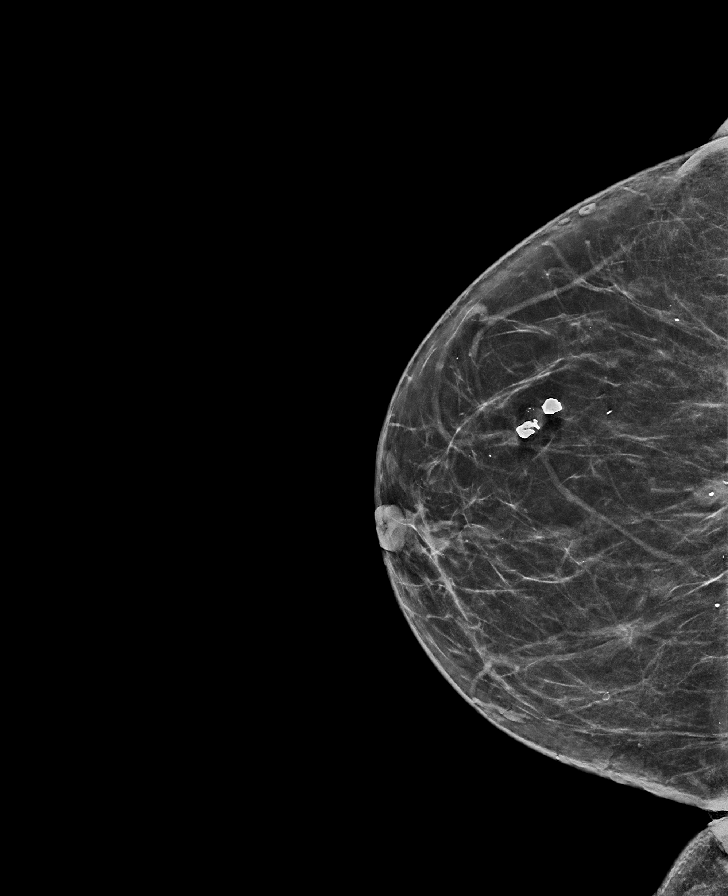

[R MLO]
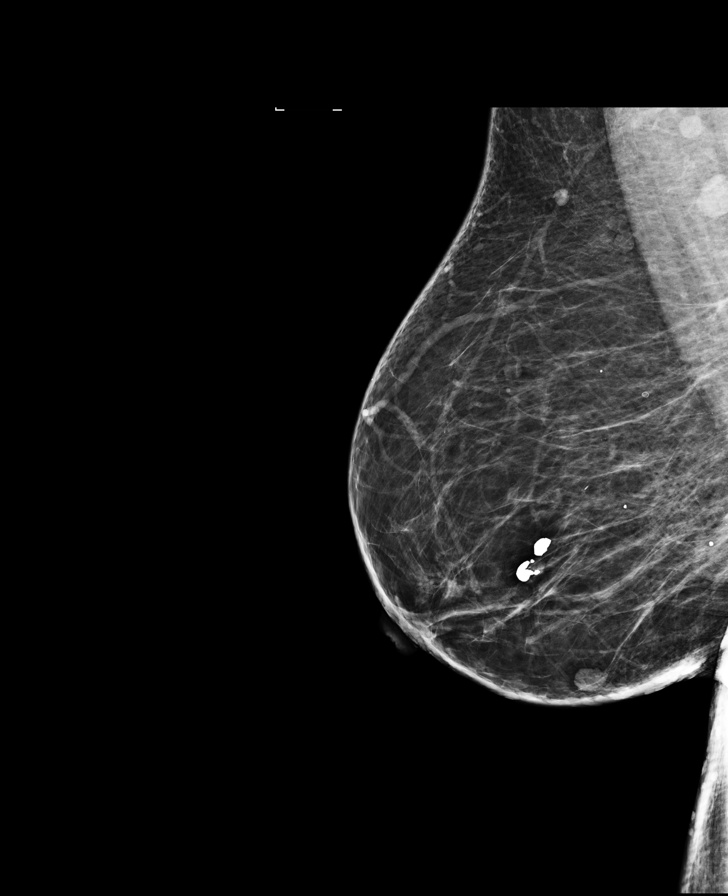

[L CC synth-2D]
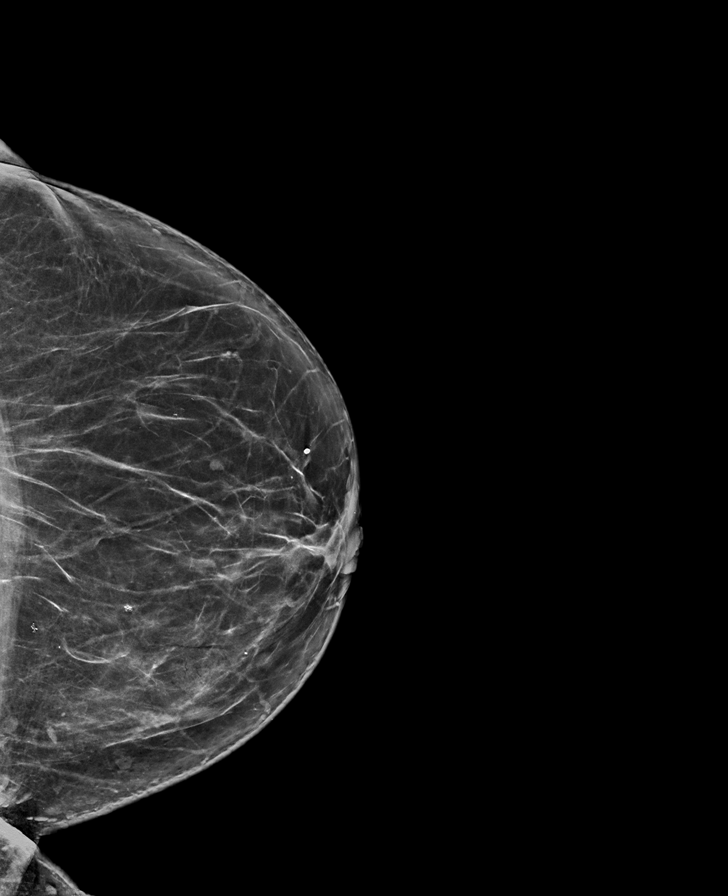

[R MLO synth-2D]
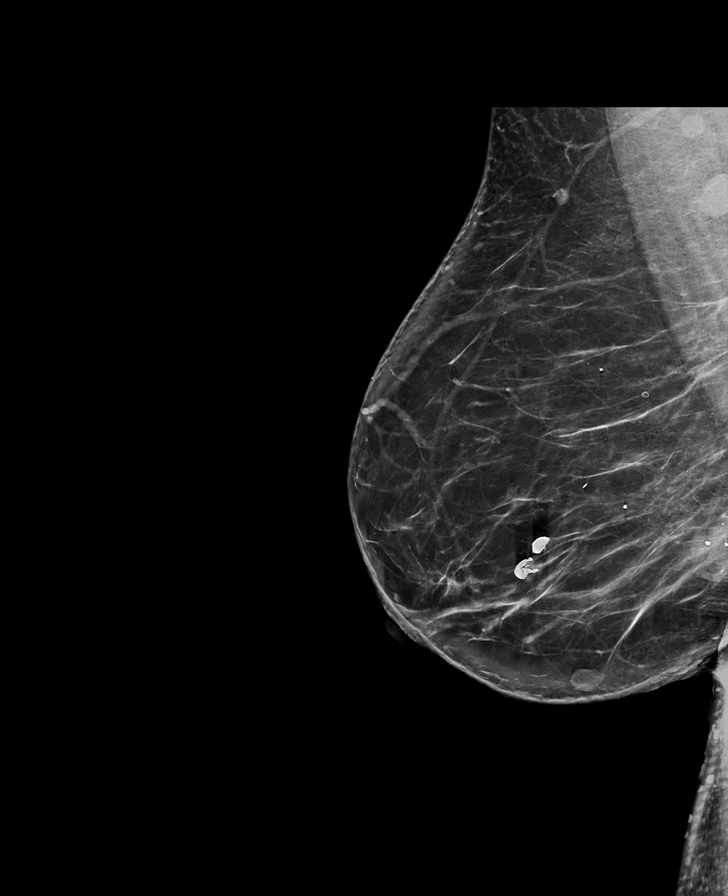

[R CC tomo · tomo slice 35/69.0]
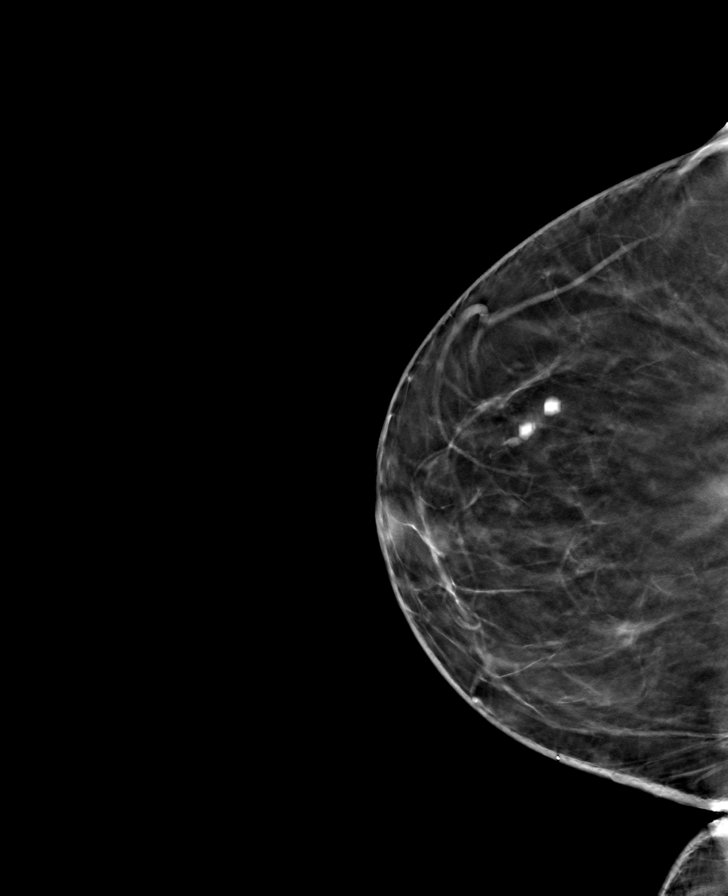

[8 of 27 positions shown; findings below may reference images not displayed]

ACR Breast Density Category b: There are scattered areas of
fibroglandular density.
FINDINGS: In the right breast, a possible mass warrants further evaluation. In
the left breast, no findings suspicious for malignancy. Images were
processed with CAD.
IMPRESSION: Further evaluation is suggested for possible mass in the right
breast.

RECOMMENDATION:
Diagnostic mammogram and possibly ultrasound of the right breast.
(Code:T1-A-550)

The patient will be contacted regarding the findings, and additional
imaging will be scheduled.

BI-RADS CATEGORY  0: Incomplete. Need additional imaging evaluation
and/or prior mammograms for comparison.

## 2019-10-09 ENCOUNTER — Ambulatory Visit: Payer: Medicare HMO | Admitting: Internal Medicine

## 2019-10-22 ENCOUNTER — Encounter: Payer: Self-pay | Admitting: Internal Medicine

## 2019-10-22 ENCOUNTER — Ambulatory Visit: Payer: Medicare HMO | Admitting: Internal Medicine

## 2019-10-22 ENCOUNTER — Other Ambulatory Visit: Payer: Self-pay

## 2019-10-22 VITALS — BP 122/70 | HR 63 | Temp 97.6°F | Ht 64.0 in | Wt 189.0 lb

## 2019-10-22 DIAGNOSIS — Z1159 Encounter for screening for other viral diseases: Secondary | ICD-10-CM

## 2019-10-22 DIAGNOSIS — Z23 Encounter for immunization: Secondary | ICD-10-CM | POA: Diagnosis not present

## 2019-10-22 DIAGNOSIS — R7303 Prediabetes: Secondary | ICD-10-CM

## 2019-10-22 DIAGNOSIS — R809 Proteinuria, unspecified: Secondary | ICD-10-CM

## 2019-10-22 NOTE — Progress Notes (Signed)
Date:  10/22/2019   Name:  Natalie Gibbs   DOB:  [birth year]   MRN:  [id number]   Chief Complaint: Diabetes (Follow up.)  Diabetes She presents for her follow-up diabetic visit. Diabetes type: prediabetes. Her disease course has been stable. Pertinent negatives for hypoglycemia include no dizziness, headaches or nervousness/anxiousness. Pertinent negatives for diabetes include no chest pain and no fatigue. Current diabetic treatment includes diet. Her weight is stable.    Lab Results  Component Value Date   CREATININE 0.79 04/05/2019   BUN 12 04/05/2019   NA 141 04/05/2019   K 4.3 04/05/2019   CL 106 04/05/2019   CO2 22 04/05/2019   Lab Results  Component Value Date   CHOL 138 04/05/2019   HDL 38 (L) 04/05/2019   LDLCALC 85 04/05/2019   TRIG 78 04/05/2019   CHOLHDL 3.6 04/05/2019   Lab Results  Component Value Date   TSH 2.730 04/05/2019   Lab Results  Component Value Date   HGBA1C 5.7 (H) 04/05/2019   Lab Results  Component Value Date   WBC 4.4 04/05/2019   HGB 12.0 04/05/2019   HCT 38.1 04/05/2019   MCV 89 04/05/2019   PLT 250 04/05/2019   Lab Results  Component Value Date   ALT 13 04/05/2019   AST 15 04/05/2019   ALKPHOS 86 04/05/2019   BILITOT 0.4 04/05/2019     Review of Systems  Constitutional: Negative for chills, fatigue, fever and unexpected weight change.  Eyes: Positive for visual disturbance (glaucoma being treated with 3 medications).  Respiratory: Negative for cough, chest tightness and shortness of breath.   Cardiovascular: Negative for chest pain, palpitations and leg swelling.  Gastrointestinal: Negative for abdominal pain.  Musculoskeletal: Negative for arthralgias.  Neurological: Negative for dizziness and headaches.  Psychiatric/Behavioral: Negative for dysphoric mood and sleep disturbance. The patient is not nervous/anxious.     Patient Active Problem List   Diagnosis Date Noted  . Osteopenia determined by x-ray  04/05/2019  . Abnormal mammogram of right breast 04/19/2017  . Intraductal papilloma of breast 10/27/2016  . Endometrial hyperplasia 06/14/2016  . Prediabetes 05/15/2014  . Allergic state 05/15/2014  . Fibrocystic breast 05/15/2014  . Glaucoma 05/15/2014    Allergies  Allergen Reactions  . Iodine Itching  . Shellfish Allergy Itching  . Codeine Rash    Past Surgical History:  Procedure Laterality Date  . BREAST BIOPSY Left 20 + yrs ago   benign  . BREAST CYST ASPIRATION Left 2015   neg  . BREAST DUCTAL SYSTEM EXCISION Left 10/26/2016   Intraductal papilloma Surgeon: Christene Lye, MD;  Location: [facility] ORS;  Service: General;  Laterality: Left;  . CATARACT EXTRACTION, BILATERAL Bilateral 2017  . COLONOSCOPY  2010   Dr. Candace Cruise, [facility]  . COLONOSCOPY N/A 05/13/2014   Procedure: COLONOSCOPY;  Surgeon: Hulen Luster, MD;  [facility] ENDOSCOPY;  Service: Gastroenterology;  Laterality: N/A;  . COLONOSCOPY  04/28/2016   Dr. Jamal Collin.  . COLONOSCOPY WITH PROPOFOL N/A 10/11/2017   Procedure: COLONOSCOPY WITH PROPOFOL;  Surgeon: Toledo, Benay Pike, MD;  Location: [facility] ENDOSCOPY;  Service: Gastroenterology;  Laterality: N/A;  . excision mass abdominal wall  1997  . EYE SURGERY  2017   bilateral  . TUBAL LIGATION      Social History   Tobacco Use  . Smoking status: Never Smoker  . Smokeless tobacco: Never Used  . Tobacco comment: smoking cessation materials not required  Vaping Use  . Vaping  Use: Never used  Substance Use Topics  . Alcohol use: No    Alcohol/week: 0.0 standard drinks  . Drug use: No     Medication list has been reviewed and updated.  Current Meds  Medication Sig  . Accu-Chek Softclix Lancets lancets 1 each by Other route daily. Use to test blood sugar once daily.  . Alcohol Swabs (B-D SINGLE USE SWABS REGULAR) PADS USE AS DIRECTED  . aspirin 81 MG tablet Take 81 mg by mouth daily.  . blood glucose meter kit and supplies Dispense based on patient and  insurance preference. Use up to four times daily as directed. (FOR ICD-10 - E11.9).  . brimonidine (ALPHAGAN) 0.2 % ophthalmic solution Place 1 drop into both eyes 2 (two) times daily.   . brinzolamide (AZOPT) 1 % ophthalmic suspension 1 drop 2 (two) times daily.  . calcium-vitamin D (OSCAL WITH D) 500-200 MG-UNIT tablet Take 1 tablet by mouth.  . dorzolamide-timolol (COSOPT) 22.3-6.8 MG/ML ophthalmic solution Place 1 drop into both eyes 2 (two) times daily.  [name] glucose blood (ACCU-CHEK AVIVA) test strip Test blood sugar once daily  . latanoprost (XALATAN) 0.005 % ophthalmic solution Place 1 drop into both eyes at bedtime.   [name] losartan (COZAAR) 25 MG tablet TAKE 1 TABLET EVERY DAY  . neomycin-polymyxin b-dexamethasone (MAXITROL) 3.5-10000-0.1 SUSP     PHQ 2/9 Scores 10/22/2019 04/23/2019 04/05/2019 04/03/2018  PHQ - 2 Score 0 0 0 0  PHQ- 9 Score 0 - 0 0    GAD 7 : Generalized Anxiety Score 10/22/2019  Nervous, Anxious, on Edge 0  Control/stop worrying 0  Worry too much - different things 0  Trouble relaxing 0  Restless 0  Easily annoyed or irritable 0  Afraid - awful might happen 0  Total GAD 7 Score 0  Anxiety Difficulty Not difficult at all    BP Readings from Last 3 Encounters:  10/22/19 122/70  04/05/19 116/64  04/03/18 118/78    Physical Exam Constitutional:      Appearance: Normal appearance.  Neck:     Thyroid: No thyroid mass.     Vascular: No carotid bruit.  Cardiovascular:     Rate and Rhythm: Normal rate and regular rhythm.     Pulses: Normal pulses.     Heart sounds: No murmur heard.   Pulmonary:     Effort: Pulmonary effort is normal.     Breath sounds: Normal breath sounds. No wheezing or rhonchi.  Musculoskeletal:     Cervical back: Normal range of motion.     Right lower leg: No edema.     Left lower leg: No edema.  Skin:    General: Skin is warm.     Capillary Refill: Capillary refill takes less than 2 seconds.  Neurological:     General: No focal  deficit present.     Mental Status: She is alert.  Psychiatric:        Attention and Perception: Attention normal.        Mood and Affect: Mood normal.     Wt Readings from Last 3 Encounters:  10/22/19 189 lb (85.7 kg)  04/05/19 186 lb (84.4 kg)  04/03/18 185 lb (83.9 kg)    BP 122/70   Pulse 63   Temp 97.6 F (36.4 C) (Oral)   Ht 5' 4"  (1.626 m)   Wt 189 lb (85.7 kg)   SpO2 97%   BMI 32.44 kg/m   Assessment and Plan: 1. Prediabetes BS are well controlled  on diet Continue current diet regimen - Hemoglobin A1c  2. 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 Need for hepatitis C screening test - Hepatitis C antibody  4. Need for immunization against influenza - Flu Vaccine QUAD High Dose(Fluad)   Partially dictated using Editor, commissioning. Any errors are unintentional.  Halina Maidens, MD [facility]  10/22/2019

## 2019-10-23 LAB — HEMOGLOBIN A1C
Est. average glucose Bld gHb Est-mCnc: 131 mg/dL
Hgb A1c MFr Bld: 6.2 % — ABNORMAL HIGH (ref 4.8–5.6)

## 2019-10-23 LAB — HEPATITIS C ANTIBODY: Hep C Virus Ab: 0.1 s/co ratio (ref 0.0–0.9)

## 2020-01-23 ENCOUNTER — Other Ambulatory Visit: Payer: Self-pay | Admitting: Internal Medicine

## 2020-01-23 DIAGNOSIS — E1129 Type 2 diabetes mellitus with other diabetic kidney complication: Secondary | ICD-10-CM

## 2020-03-26 ENCOUNTER — Other Ambulatory Visit: Payer: Self-pay | Admitting: Internal Medicine

## 2020-03-26 DIAGNOSIS — R809 Proteinuria, unspecified: Secondary | ICD-10-CM

## 2020-03-26 DIAGNOSIS — E1129 Type 2 diabetes mellitus with other diabetic kidney complication: Secondary | ICD-10-CM

## 2020-04-08 ENCOUNTER — Other Ambulatory Visit: Payer: Self-pay

## 2020-04-08 ENCOUNTER — Ambulatory Visit (INDEPENDENT_AMBULATORY_CARE_PROVIDER_SITE_OTHER): Payer: Medicare HMO | Admitting: Internal Medicine

## 2020-04-08 ENCOUNTER — Encounter: Payer: Self-pay | Admitting: Internal Medicine

## 2020-04-08 VITALS — BP 130/78 | HR 69 | Temp 97.4°F | Ht 64.0 in | Wt 191.0 lb

## 2020-04-08 DIAGNOSIS — H9193 Unspecified hearing loss, bilateral: Secondary | ICD-10-CM

## 2020-04-08 DIAGNOSIS — R7303 Prediabetes: Secondary | ICD-10-CM

## 2020-04-08 DIAGNOSIS — H9313 Tinnitus, bilateral: Secondary | ICD-10-CM

## 2020-04-08 DIAGNOSIS — Z0000 Encounter for general adult medical examination without abnormal findings: Secondary | ICD-10-CM

## 2020-04-08 DIAGNOSIS — R809 Proteinuria, unspecified: Secondary | ICD-10-CM

## 2020-04-08 DIAGNOSIS — Z1231 Encounter for screening mammogram for malignant neoplasm of breast: Secondary | ICD-10-CM | POA: Diagnosis not present

## 2020-04-08 LAB — POCT UA - MICROALBUMIN: Microalbumin Ur, POC: 100 mg/L

## 2020-04-08 NOTE — Progress Notes (Signed)
Date:  04/08/2020   Name:  Natalie Gibbs   DOB:  11/17/1940   MRN:  [id number]   Chief Complaint: Annual Exam (Breast exam no pap)  Natalie Gibbs Hermela Hardt is a 80 y.o. female who presents today for her Complete Annual Exam. She feels fairly well. She reports exercising walking X2 days a week. She reports she is sleeping poorly. Breast complaints none.  Mammogram: 05/2019 DEXA: 05/2018 osteopenia Pap smear: discontinued Colonoscopy: 10/2017  Immunization History  Administered Date(s) Administered  . Fluad Quad(high Dose 65+) 10/22/2019  . Influenza,inj,Quad PF,6+ Mos 09/10/2015, 03/23/2017  . PFIZER(Purple Top)SARS-COV-2 Vaccination 01/31/2019, 02/16/2019  . Pneumococcal Conjugate-13 03/06/2014  . Pneumococcal Polysaccharide-23 03/06/2008  . Tdap 03/10/2015    Diabetes She presents for her follow-up diabetic visit. Diabetes type: prediabetes. Her disease course has been stable. Pertinent negatives for hypoglycemia include no dizziness, headaches, nervousness/anxiousness or tremors. Pertinent negatives for diabetes include no chest pain, no fatigue, no polydipsia and no polyuria. Symptoms are stable. Current diabetic treatment includes diet. She is compliant with treatment most of the time. She is following a diabetic diet. She monitors blood glucose at home 1-2 x per day. There is no change in her home blood glucose trend. Her breakfast blood glucose range is generally 110-130 mg/dl. An ACE inhibitor/angiotensin II receptor blocker is being taken. Eye exam is current.   Tinnitus - this has been coming and going for the past 2 years.  She also has significant hearing loss.  No dizziness, HA, ear pain.  She has never seen ENT for this problem.   Lab Results  Component Value Date   CREATININE 0.79 04/05/2019   BUN 12 04/05/2019   NA 141 04/05/2019   K 4.3 04/05/2019   CL 106 04/05/2019   CO2 22 04/05/2019   Lab Results  Component Value Date   CHOL 138 04/05/2019    HDL 38 (L) 04/05/2019   LDLCALC 85 04/05/2019   TRIG 78 04/05/2019   CHOLHDL 3.6 04/05/2019   Lab Results  Component Value Date   TSH 2.730 04/05/2019   Lab Results  Component Value Date   HGBA1C 6.2 (H) 10/22/2019   Lab Results  Component Value Date   WBC 4.4 04/05/2019   HGB 12.0 04/05/2019   HCT 38.1 04/05/2019   MCV 89 04/05/2019   PLT 250 04/05/2019   Lab Results  Component Value Date   ALT 13 04/05/2019   AST 15 04/05/2019   ALKPHOS 86 04/05/2019   BILITOT 0.4 04/05/2019     Review of Systems  Constitutional: Negative for chills, fatigue and fever.  HENT: Positive for tinnitus. Negative for congestion, hearing loss, trouble swallowing and voice change.   Eyes: Negative for visual disturbance.  Respiratory: Negative for cough, chest tightness, shortness of breath and wheezing.   Cardiovascular: Negative for chest pain, palpitations and leg swelling.  Gastrointestinal: Negative for abdominal pain, constipation, diarrhea and vomiting.  Endocrine: Negative for polydipsia and polyuria.  Genitourinary: Negative for dysuria, frequency, genital sores, vaginal bleeding and vaginal discharge.  Musculoskeletal: Negative for arthralgias, gait problem and joint swelling.  Skin: Negative for color change and rash.  Neurological: Negative for dizziness, tremors, light-headedness and headaches.  Hematological: Negative for adenopathy. Does not bruise/bleed easily.  Psychiatric/Behavioral: Negative for dysphoric mood and sleep disturbance. The patient is not nervous/anxious.     Patient Active Problem List   Diagnosis Date Noted  . Osteopenia determined by x-ray 04/05/2019  . Abnormal mammogram of right  breast 04/19/2017  . Intraductal papilloma of breast 10/27/2016  . Endometrial hyperplasia 06/14/2016  . Prediabetes 05/15/2014  . Allergic state 05/15/2014  . Fibrocystic breast 05/15/2014  . Glaucoma 05/15/2014    Allergies  Allergen Reactions  . Iodine Itching  .  Shellfish Allergy Itching  . Codeine Rash    Past Surgical History:  Procedure Laterality Date  . BREAST BIOPSY Left 20 + yrs ago   benign  . BREAST CYST ASPIRATION Left 2015   neg  . BREAST DUCTAL SYSTEM EXCISION Left 10/26/2016   Intraductal papilloma Surgeon: Christene Lye, MD;  Location: [facility] ORS;  Service: General;  Laterality: Left;  . CATARACT EXTRACTION, BILATERAL Bilateral 2017  . COLONOSCOPY  2010   Dr. Candace Cruise, [facility] [place]  . COLONOSCOPY N/A 05/13/2014   Procedure: COLONOSCOPY;  Surgeon: Hulen Luster, MD;  Location: [facility] ENDOSCOPY;  Service: Gastroenterology;  Laterality: N/A;  . COLONOSCOPY  04/28/2016   Dr. Jamal Collin.  . COLONOSCOPY WITH PROPOFOL N/A 10/11/2017   Procedure: COLONOSCOPY WITH PROPOFOL;  Surgeon: Toledo, Benay Pike, MD;  Location: [facility] ENDOSCOPY;  Service: Gastroenterology;  Laterality: N/A;  . excision mass abdominal wall  1997  . EYE SURGERY  2017   bilateral  . TUBAL LIGATION      Social History   Tobacco Use  . Smoking status: Never Smoker  . Smokeless tobacco: Never Used  . Tobacco comment: smoking cessation materials not required  Vaping Use  . Vaping Use: Never used  Substance Use Topics  . Alcohol use: No    Alcohol/week: 0.0 standard drinks  . Drug use: No     Medication list has been reviewed and updated.  Current Meds  Medication Sig  . Accu-Chek Softclix Lancets lancets 1 each by Other route daily. Use to test blood sugar once daily.  . Alcohol Swabs (B-D SINGLE USE SWABS REGULAR) PADS USE AS DIRECTED  . aspirin 81 MG tablet Take 81 mg by mouth daily.  . blood glucose meter kit and supplies Dispense based on patient and insurance preference. Use up to four times daily as directed. (FOR ICD-10 - E11.9).  . brimonidine (ALPHAGAN) 0.2 % ophthalmic solution Place 1 drop into both eyes 2 (two) times daily.   . calcium-vitamin D (OSCAL WITH D) 500-200 MG-UNIT tablet Take 1 tablet by mouth.  . dorzolamide-timolol (COSOPT) 22.3-6.8 MG/ML  ophthalmic solution Place 1 drop into both eyes 2 (two) times daily.  [name] glucose blood (ACCU-CHEK AVIVA) test strip Test blood sugar once daily  . latanoprost (XALATAN) 0.005 % ophthalmic solution Place 1 drop into both eyes at bedtime.   [name] losartan (COZAAR) 25 MG tablet TAKE 1 TABLET EVERY DAY    PHQ 2/9 Scores 04/08/2020 10/22/2019 04/23/2019 04/05/2019  PHQ - 2 Score 0 0 0 0  PHQ- 9 Score 0 0 - 0    GAD 7 : Generalized Anxiety Score 04/08/2020 10/22/2019  Nervous, Anxious, on Edge 0 0  Control/stop worrying 0 0  Worry too much - different things 0 0  Trouble relaxing 0 0  Restless 0 0  Easily annoyed or irritable 0 0  Afraid - awful might happen 0 0  Total GAD 7 Score 0 0  Anxiety Difficulty - Not difficult at all    BP Readings from Last 3 Encounters:  04/08/20 130/78  10/22/19 122/70  04/05/19 116/64    Physical Exam Vitals and nursing note reviewed.  Constitutional:      General: She is not in acute distress.  Appearance: Normal appearance. She is well-developed.  HENT:     Head: Normocephalic and atraumatic.     Right Ear: Tympanic membrane and ear canal normal. Decreased hearing noted. No swelling. There is no impacted cerumen. Tympanic membrane is not erythematous.     Left Ear: Tympanic membrane and ear canal normal. Decreased hearing noted. No swelling. There is no impacted cerumen. Tympanic membrane is not erythematous.     Nose:     Right Sinus: No maxillary sinus tenderness.     Left Sinus: No maxillary sinus tenderness.  Eyes:     General: No scleral icterus.       Right eye: No discharge.        Left eye: No discharge.     Conjunctiva/sclera: Conjunctivae normal.  Neck:     Thyroid: No thyromegaly.     Vascular: No carotid bruit.  Cardiovascular:     Rate and Rhythm: Normal rate and regular rhythm.     Pulses: Normal pulses.     Heart sounds: Normal heart sounds.  Pulmonary:     Effort: Pulmonary effort is normal. No respiratory distress.     Breath  sounds: No wheezing.  Chest:  Breasts:     Right: No mass, nipple discharge, skin change or tenderness.     Left: No mass, nipple discharge, skin change or tenderness.    Abdominal:     General: Bowel sounds are normal.     Palpations: Abdomen is soft.     Tenderness: There is no abdominal tenderness.  Musculoskeletal:     Cervical back: Normal range of motion. No erythema.     Right lower leg: No edema.     Left lower leg: No edema.  Lymphadenopathy:     Cervical: No cervical adenopathy.  Skin:    General: Skin is warm and dry.     Findings: No rash.  Neurological:     Mental Status: She is alert and oriented to person, place, and time.     Cranial Nerves: No cranial nerve deficit.     Sensory: No sensory deficit.     Deep Tendon Reflexes: Reflexes are normal and symmetric.  Psychiatric:        Attention and Perception: Attention normal.        Mood and Affect: Mood normal.     Wt Readings from Last 3 Encounters:  04/08/20 191 lb (86.6 kg)  10/22/19 189 lb (85.7 kg)  04/05/19 186 lb (84.4 kg)    BP 130/78   Pulse 69   Temp (!) 97.4 F (36.3 C) (Oral)   Ht _0  (1.626 m)   Wt 191 lb (86.6 kg)   SpO2 97%   BMI 32.79 kg/m   Assessment and Plan: 1. Annual physical exam Exam is normal except for weight. Encourage regular exercise and appropriate dietary changes. Continue regular exercise/physical activity - CBC with Differential/Platelet - Lipid panel  2. Encounter for screening mammogram for breast cancer Schedule at [facility] - MM 3D SCREEN BREAST BILATERAL; Future  3. Prediabetes Continue diet, monitor BS - Comprehensive metabolic panel - Hemoglobin A1c  4. Microalbuminuria Continue ARB - POCT UA - Microalbumin  5. Tinnitus aurium, bilateral - Ambulatory referral to ENT  6. Bilateral hearing loss, unspecified hearing loss type - Ambulatory referral to ENT   Partially dictated using Dragon software. Any errors are unintentional.  Halina Maidens,  MD [facility]  04/08/2020

## 2020-04-09 LAB — COMPREHENSIVE METABOLIC PANEL
ALT: 14 IU/L (ref 0–32)
AST: 16 IU/L (ref 0–40)
Albumin/Globulin Ratio: 1.3 (ref 1.2–2.2)
Albumin: 4.2 g/dL (ref 3.7–4.7)
Alkaline Phosphatase: 90 IU/L (ref 44–121)
BUN/Creatinine Ratio: 13 (ref 12–28)
BUN: 10 mg/dL (ref 8–27)
Bilirubin Total: 0.4 mg/dL (ref 0.0–1.2)
CO2: 23 mmol/L (ref 20–29)
Calcium: 9.2 mg/dL (ref 8.7–10.3)
Chloride: 105 mmol/L (ref 96–106)
Creatinine, Ser: 0.77 mg/dL (ref 0.57–1.00)
Globulin, Total: 3.2 g/dL (ref 1.5–4.5)
Glucose: 125 mg/dL — ABNORMAL HIGH (ref 65–99)
Potassium: 4.6 mmol/L (ref 3.5–5.2)
Sodium: 143 mmol/L (ref 134–144)
Total Protein: 7.4 g/dL (ref 6.0–8.5)
eGFR: 79 mL/min/{1.73_m2} (ref >59)

## 2020-04-09 LAB — CBC WITH DIFFERENTIAL/PLATELET
Basophils Absolute: 0 10*3/uL (ref 0.0–0.2)
Basos: 1 %
EOS (ABSOLUTE): 0.2 10*3/uL (ref 0.0–0.4)
Eos: 5 %
Hematocrit: 37.9 % (ref 34.0–46.6)
Hemoglobin: 12.2 g/dL (ref 11.1–15.9)
Immature Grans (Abs): 0 10*3/uL (ref 0.0–0.1)
Immature Granulocytes: 0 %
Lymphocytes Absolute: 1.6 10*3/uL (ref 0.7–3.1)
Lymphs: 39 %
MCH: 29 pg (ref 26.6–33.0)
MCHC: 32.2 g/dL (ref 31.5–35.7)
MCV: 90 fL (ref 79–97)
Monocytes Absolute: 0.3 10*3/uL (ref 0.1–0.9)
Monocytes: 7 %
Neutrophils Absolute: 2 10*3/uL (ref 1.4–7.0)
Neutrophils: 48 %
Platelets: 239 10*3/uL (ref 150–450)
RBC: 4.21 x10E6/uL (ref 3.77–5.28)
RDW: 12 % (ref 11.7–15.4)
WBC: 4.2 10*3/uL (ref 3.4–10.8)

## 2020-04-09 LAB — LIPID PANEL
Chol/HDL Ratio: 4.1 ratio (ref 0.0–4.4)
Cholesterol, Total: 146 mg/dL (ref 100–199)
HDL: 36 mg/dL — ABNORMAL LOW (ref >39)
LDL Chol Calc (NIH): 95 mg/dL (ref 0–99)
Triglycerides: 75 mg/dL (ref 0–149)
VLDL Cholesterol Cal: 15 mg/dL (ref 5–40)

## 2020-04-09 LAB — HEMOGLOBIN A1C
Est. average glucose Bld gHb Est-mCnc: 128 mg/dL
Hgb A1c MFr Bld: 6.1 % — ABNORMAL HIGH (ref 4.8–5.6)

## 2020-04-21 ENCOUNTER — Telehealth: Payer: Self-pay

## 2020-04-21 NOTE — Telephone Encounter (Unsigned)
Copied from [place] [phone]. Topic: Appointment Scheduling - Scheduling Inquiry for Clinic >> Apr 18, 2020  2:41 PM Greggory Keen D wrote: Reason for CRM: Pt has a medicare wellness visit schd for Wednesday .  She would like to resch.or can she do a phone visit.  CB# [phone]

## 2020-04-23 ENCOUNTER — Ambulatory Visit (INDEPENDENT_AMBULATORY_CARE_PROVIDER_SITE_OTHER): Payer: Medicare HMO

## 2020-04-23 DIAGNOSIS — Z0000 Encounter for general adult medical examination without abnormal findings: Secondary | ICD-10-CM | POA: Diagnosis not present

## 2020-04-23 NOTE — Patient Instructions (Signed)
Ms. Natalie Gibbs , Thank you for taking time to come for your Medicare Wellness Visit. I appreciate your ongoing commitment to your health goals. Please review the following plan we discussed and let me know if I can assist you in the future.   Screening recommendations/referrals: Colonoscopy: done 10/11/17. Repeat in 2024 Mammogram: done 05/12/19. Scheduled for 06/03/20 Bone Density: done 05/22/18 Recommended yearly ophthalmology/optometry visit for glaucoma screening and checkup Recommended yearly dental visit for hygiene and checkup  Vaccinations: Influenza vaccine: done 10/22/19 Pneumococcal vaccine: done 03/06/14 Tdap vaccine: done 03/10/15 Shingles vaccine: Shingrix discussed. Please contact your pharmacy for coverage information.  Covid-19: done 01/31/19 & 02/16/19; please bring a copy of your vaccine record to your next appt for booster information   Advanced directives: Please bring a copy of your health care power of attorney and living will to the office at your convenience.  Conditions/risks identified: Keep up the great work!  Next appointment: Follow up in one year for your annual wellness visit    Preventive Care 65 Years and Older, Female Preventive care refers to lifestyle choices and visits with your health care provider that can promote health and wellness. What does preventive care include?  A yearly physical exam. This is also called an annual well check.  Dental exams once or twice a year.  Routine eye exams. Ask your health care provider how often you should have your eyes checked.  Personal lifestyle choices, including:  Daily care of your teeth and gums.  Regular physical activity.  Eating a healthy diet.  Avoiding tobacco and drug use.  Limiting alcohol use.  Practicing safe sex.  Taking low-dose aspirin every day.  Taking vitamin and mineral supplements as recommended by your health care provider. What happens during an annual well check? The services  and screenings done by your health care provider during your annual well check will depend on your age, overall health, lifestyle risk factors, and family history of disease. Counseling  Your health care provider may ask you questions about your:  Alcohol use.  Tobacco use.  Drug use.  Emotional well-being.  Home and relationship well-being.  Sexual activity.  Eating habits.  History of falls.  Memory and ability to understand (cognition).  Work and work Statistician.  Reproductive health. Screening  You may have the following tests or measurements:  Height, weight, and BMI.  Blood pressure.  Lipid and cholesterol levels. These may be checked every 5 years, or more frequently if you are over 52 years old.  Skin check.  Lung cancer screening. You may have this screening every year starting at age 52 if you have a 30-pack-year history of smoking and currently smoke or have quit within the past 15 years.  Fecal occult blood test (FOBT) of the stool. You may have this test every year starting at age 36.  Flexible sigmoidoscopy or colonoscopy. You may have a sigmoidoscopy every 5 years or a colonoscopy every 10 years starting at age 7.  Hepatitis C blood test.  Hepatitis B blood test.  Sexually transmitted disease (STD) testing.  Diabetes screening. This is done by checking your blood sugar (glucose) after you have not eaten for a while (fasting). You may have this done every 1-3 years.  Bone density scan. This is done to screen for osteoporosis. You may have this done starting at age 33.  Mammogram. This may be done every 1-2 years. Talk to your health care provider about how often you should have regular mammograms. Talk with  your health care provider about your test results, treatment options, and if necessary, the need for more tests. Vaccines  Your health care provider may recommend certain vaccines, such as:  Influenza vaccine. This is recommended every  year.  Tetanus, diphtheria, and acellular pertussis (Tdap, Td) vaccine. You may need a Td booster every 10 years.  Zoster vaccine. You may need this after age 4.  Pneumococcal 13-valent conjugate (PCV13) vaccine. One dose is recommended after age 94.  Pneumococcal polysaccharide (PPSV23) vaccine. One dose is recommended after age 1. Talk to your health care provider about which screenings and vaccines you need and how often you need them. This information is not intended to replace advice given to you by your health care provider. Make sure you discuss any questions you have with your health care provider. Document Released: 01/17/2015 Document Revised: 09/10/2015 Document Reviewed: 10/22/2014 Elsevier Interactive Patient Education  2017 [place] Prevention in the Home Falls can cause injuries. They can happen to people of all ages. There are many things you can do to make your home safe and to help prevent falls. What can I do on the outside of my home?  Regularly fix the edges of walkways and driveways and fix any cracks.  Remove anything that might make you trip as you walk through a door, such as a raised step or threshold.  Trim any bushes or trees on the path to your home.  Use bright outdoor lighting.  Clear any walking paths of anything that might make someone trip, such as rocks or tools.  Regularly check to see if handrails are loose or broken. Make sure that both sides of any steps have handrails.  Any raised decks and porches should have guardrails on the edges.  Have any leaves, snow, or ice cleared regularly.  Use sand or salt on walking paths during winter.  Clean up any spills in your garage right away. This includes oil or grease spills. What can I do in the bathroom?  Use night lights.  Install grab bars by the toilet and in the tub and shower. Do not use towel bars as grab bars.  Use non-skid mats or decals in the tub or shower.  If you  need to sit down in the shower, use a plastic, non-slip stool.  Keep the floor dry. Clean up any water that spills on the floor as soon as it happens.  Remove soap buildup in the tub or shower regularly.  Attach bath mats securely with double-sided non-slip rug tape.  Do not have throw rugs and other things on the floor that can make you trip. What can I do in the bedroom?  Use night lights.  Make sure that you have a light by your bed that is easy to reach.  Do not use any sheets or blankets that are too big for your bed. They should not hang down onto the floor.  Have a firm chair that has side arms. You can use this for support while you get dressed.  Do not have throw rugs and other things on the floor that can make you trip. What can I do in the kitchen?  Clean up any spills right away.  Avoid walking on wet floors.  Keep items that you use a lot in easy-to-reach places.  If you need to reach something above you, use a strong step stool that has a grab bar.  Keep electrical cords out of the way.  Do not  use floor polish or wax that makes floors slippery. If you must use wax, use non-skid floor wax.  Do not have throw rugs and other things on the floor that can make you trip. What can I do with my stairs?  Do not leave any items on the stairs.  Make sure that there are handrails on both sides of the stairs and use them. Fix handrails that are broken or loose. Make sure that handrails are as long as the stairways.  Check any carpeting to make sure that it is firmly attached to the stairs. Fix any carpet that is loose or worn.  Avoid having throw rugs at the top or bottom of the stairs. If you do have throw rugs, attach them to the floor with carpet tape.  Make sure that you have a light switch at the top of the stairs and the bottom of the stairs. If you do not have them, ask someone to add them for you. What else can I do to help prevent falls?  Wear shoes  that:  Do not have high heels.  Have rubber bottoms.  Are comfortable and fit you well.  Are closed at the toe. Do not wear sandals.  If you use a stepladder:  Make sure that it is fully opened. Do not climb a closed stepladder.  Make sure that both sides of the stepladder are locked into place.  Ask someone to hold it for you, if possible.  Clearly mark and make sure that you can see:  Any grab bars or handrails.  First and last steps.  Where the edge of each step is.  Use tools that help you move around (mobility aids) if they are needed. These include:  Canes.  Walkers.  Scooters.  Crutches.  Turn on the lights when you go into a dark area. Replace any light bulbs as soon as they burn out.  Set up your furniture so you have a clear path. Avoid moving your furniture around.  If any of your floors are uneven, fix them.  If there are any pets around you, be aware of where they are.  Review your medicines with your doctor. Some medicines can make you feel dizzy. This can increase your chance of falling. Ask your doctor what other things that you can do to help prevent falls. This information is not intended to replace advice given to you by your health care provider. Make sure you discuss any questions you have with your health care provider. Document Released: 10/17/2008 Document Revised: 05/29/2015 Document Reviewed: 01/25/2014 Elsevier Interactive Patient Education  2017 [employer].

## 2020-04-23 NOTE — Progress Notes (Signed)
Subjective:   Sangeeta Youse is a 80 y.o. female who presents for Medicare Annual (Subsequent) preventive examination.  Virtual Visit via Telephone Note  I connected with  Sophea Rackham Spinks Strassner on 04/23/20 at  9:20 AM EDT by telephone and verified that I am speaking with the correct person using two identifiers.  Location: Patient: home Provider: [facility] Persons participating in the virtual visit: [place]   I discussed the limitations, risks, security and privacy concerns of performing an evaluation and management service by telephone and the availability of in person appointments. The patient expressed understanding and agreed to proceed.  Interactive audio and video telecommunications were attempted between this nurse and patient, however failed, due to patient having technical difficulties OR patient did not have access to video capability.  We continued and completed visit with audio only.  Some vital signs may be absent or patient reported.   Clemetine Marker, LPN    Review of Systems     Cardiac Risk Factors include: advanced age (>71mn, >>18women);hypertension     Objective:    There were no vitals filed for this visit. There is no height or weight on file to calculate BMI.  Advanced Directives 04/23/2020 04/23/2019 03/29/2018 10/11/2017 03/23/2017 10/26/2016 10/19/2016  Does Patient Have a Medical Advance Directive? Yes No _0   Type of AParamed[place]of [place] will - Living will;Healthcare Power of Attorney Living will [place] Healthcare [place] Does patient want to make changes to medical advance directive? - - - - - No - Patient declined No - Patient declined  Copy of HO[place]in Chart? No - copy requested - No - copy requested - No - copy requested No - copy requested No - copy requested  Would patient like  information on creating a medical advance directive? - Yes (MAU/Ambulatory/Procedural Areas - Information given) - - - - -    Current Medications (verified) Outpatient Encounter Medications as of 04/23/2020  Medication Sig  . Accu-Chek Softclix Lancets lancets 1 each by Other route daily. Use to test blood sugar once daily.  . Alcohol Swabs (B-D SINGLE USE SWABS REGULAR) PADS USE AS DIRECTED  . aspirin 81 MG tablet Take 81 mg by mouth daily.  . blood glucose meter kit and supplies Dispense based on patient and insurance preference. Use up to four times daily as directed. (FOR ICD-10 - E11.9).  . brimonidine (ALPHAGAN) 0.2 % ophthalmic solution Place 1 drop into both eyes 2 (two) times daily.   . calcium-vitamin D (OSCAL WITH D) 500-200 MG-UNIT tablet Take 1 tablet by mouth.  . dorzolamide-timolol (COSOPT) 22.3-6.8 MG/ML ophthalmic solution Place 1 drop into both eyes 2 (two) times daily.  .Marland Kitchenglucose blood (ACCU-CHEK AVIVA) test strip Test blood sugar once daily  . losartan (COZAAR) 25 MG tablet TAKE 1 TABLET EVERY DAY  . [DISCONTINUED] latanoprost (XALATAN) 0.005 % ophthalmic solution Place 1 drop into both eyes at bedtime.    No facility-administered encounter medications on file as of 04/23/2020.    Allergies (verified) Iodine, Shellfish allergy, and Codeine   History: Past Medical History:  Diagnosis Date  . Allergy   . Arthritis   . Diffuse cystic mastopathy   . Glaucoma   . Hypertension   . Obesity, unspecified   . Pre-diabetes   . Special screening for malignant neoplasms, colon    Past Surgical History:  Procedure Laterality Date  . BREAST BIOPSY Left 20 + yrs ago   benign  . BREAST CYST ASPIRATION Left 2015   neg  . BREAST DUCTAL SYSTEM EXCISION Left 10/26/2016   Intraductal papilloma Surgeon: Christene Lye, MD;  Location: [facility] ORS;  Service: General;  Laterality: Left;  . CATARACT EXTRACTION, BILATERAL Bilateral 2017  . COLONOSCOPY  2010   Dr. Candace Cruise, [facility]  .  COLONOSCOPY N/A 05/13/2014   Procedure: COLONOSCOPY;  Surgeon: Hulen Luster, MD;  Location[facility] ENDOSCOPY;  Service: Gastroenterology;  Laterality: N/A;  . COLONOSCOPY  04/28/2016   Dr. Jamal Collin.  . COLONOSCOPY WITH PROPOFOL N/A 10/11/2017   Procedure: COLONOSCOPY WITH PROPOFOL;  Surgeon: Toledo, Benay Pike, MD;  Location: [facility] ENDOSCOPY;  Service: Gastroenterology;  Laterality: N/A;  . excision mass abdominal wall  1997  . EYE SURGERY  2017   bilateral  . TUBAL LIGATION     Family History  Problem Relation Age of Onset  . Healthy Mother   . Emphysema Father   . Breast cancer Other        niece/breast  . Diabetes Sister   . Diabetes Sister   . Breast cancer Other        niece  . Kidney failure Brother   . Multiple sclerosis Daughter    Social History   Socioeconomic History  . Marital status: Married    Spouse name: Not on file  . Number of children: 4  . Years of education: Not on file  . Highest education level: 10th grade  Occupational History  . Occupation: Retired    Fish farm manager: OTHER  Tobacco Use  . Smoking status: Never Smoker  . Smokeless tobacco: Never Used  . Tobacco comment: smoking cessation materials not required  Vaping Use  . Vaping Use: Never used  Substance and Sexual Activity  . Alcohol use: No    Alcohol/week: 0.0 standard drinks  . Drug use: No  . Sexual activity: Never  Other Topics Concern  . Not on file  Social History Narrative  . Not on file   Social Determinants of Health   Financial Resource Strain: Low Risk   . Difficulty of Paying Living Expenses: Not hard at all  Food Insecurity: No Food Insecurity  . Worried About Charity fundraiser in the Last Year: Never true  . Ran Out of Food in the Last Year: Never true  Transportation Needs: No Transportation Needs  . Lack of Transportation (Medical): No  . Lack of Transportation (Non-Medical): No  Physical Activity: Insufficiently Active  . Days of Exercise per Week: 2 days  . Minutes of  Exercise per Session: 30 min  Stress: No Stress Concern Present  . Feeling of Stress : Not at all  Social Connections: Moderately Integrated  . Frequency of Communication with Friends and Family: More than three times a week  . Frequency of Social Gatherings with Friends and Family: More than three times a week  . Attends Religious Services: More than 4 times per year  . Active Member of Clubs or Organizations: No  . Attends Archivist Meetings: Never  . Marital Status: Married    Tobacco Counseling Counseling given: Not Answered Comment: smoking cessation materials not required   Clinical Intake:  Pre-visit preparation completed: Yes  Pain : No/denies pain     Nutritional Risks: None Diabetes: No  How often do you need to have someone help you when you read instructions, pamphlets, or other written materials from  your doctor or pharmacy?: 1 - Never    Interpreter Needed?: No  Information entered by :: Clemetine Marker LPN   Activities of Daily Living In your present state of health, do you have any difficulty performing the following activities: 04/23/2020  Hearing? N  Comment declines hearing aids  Vision? N  Difficulty concentrating or making decisions? N  Walking or climbing stairs? N  Dressing or bathing? N  Doing errands, shopping? N  Preparing Food and eating ? N  Using the Toilet? N  In the past six months, have you accidently leaked urine? Y  Comment wears pads for protection at night  Do you have problems with loss of bowel control? N  Managing your Medications? N  Managing your Finances? N  Housekeeping or managing your Housekeeping? N  Some recent data might be hidden    Patient Care Team: Glean Hess, MD as PCP - General (Internal Medicine) Lorelee Cover., MD (Ophthalmology)  Indicate any recent Medical Services you may have received from other than [facility] providers in the past year (date may be approximate).     Assessment:    This is a routine wellness examination for Junnie Loschiavo.  Hearing/Vision screen  Hearing Screening   _0  _1  _2  _3  _4  _5  _6  _7  _8   Right ear:           Left ear:           Comments: Pt denies hearing difficulty  Vision Screening Comments: Annual vision screenings with Dr. Gloriann Loan   Dietary issues and exercise activities discussed: Current Exercise Habits: Home exercise routine, Type of exercise: calisthenics;strength training/weights;treadmill, Time (Minutes): 30, Frequency (Times/Week): 2, Weekly Exercise (Minutes/Week): 60, Intensity: Moderate, Exercise limited by: None identified  Goals    . DIET - INCREASE WATER INTAKE     Recommend to drink at least 6-8 8oz glasses of water per day.    . Increase physical activity      Depression Screen PHQ 2/9 Scores 04/23/2020 04/08/2020 10/22/2019 04/23/2019 04/05/2019 04/03/2018 03/29/2018  PHQ - 2 Score 0 0 0 0 0 0 0  PHQ- 9 Score - 0 0 - 0 0 -    Fall Risk Fall Risk  04/23/2020 04/08/2020 10/22/2019 04/23/2019 04/05/2019  Falls in the past year? 0 0 0 0 0  Number falls in past yr: 0 - 0 0 0  Injury with Fall? 0 - 0 0 0  Risk for fall due to : No Fall Risks - No Fall Risks No Fall Risks No Fall Risks  Risk for fall due to: Comment - - - - -  Follow up Falls prevention discussed Falls evaluation completed Falls evaluation completed Falls prevention discussed Falls evaluation completed    FALL RISK PREVENTION PERTAINING TO THE HOME:  Any stairs in or around the home? Yes  If so, are there any without handrails? No  Home free of loose throw rugs in walkways, pet beds, electrical cords, etc? Yes  Adequate lighting in your home to reduce risk of falls? Yes   ASSISTIVE DEVICES UTILIZED TO PREVENT FALLS:  Life alert? No  Use of a cane, walker or w/c? No  Grab bars in the bathroom? No  Shower chair or bench in shower? Yes  Elevated toilet seat or a handicapped toilet? No   TIMED UP AND GO:  Was the test performed? No .  Telephonic visit.   Cognitive Function: Normal cognitive status assessed by direct observation by this Nurse Health Advisor. No abnormalities  found.       6CIT Screen 04/23/2019 03/29/2018 03/23/2017 03/31/2016  What Year? 0 points 0 points 0 points 0 points  What month? 0 points 0 points 0 points 0 points  What time? 0 points 0 points 3 points 0 points  Count back from 20 0 points 0 points 0 points 0 points  Months in reverse 4 points 4 points 0 points 0 points  Repeat phrase 6 points 2 points 8 points 2 points  Total Score _0 Immunizations Immunization History  Administered Date(s) Administered  . Fluad Quad(high Dose 65+) 10/22/2019  . Influenza,inj,Quad PF,6+ Mos 09/10/2015, 03/23/2017  . PFIZER(Purple Top)SARS-COV-2 Vaccination 01/31/2019, 02/16/2019  . Pneumococcal Conjugate-13 03/06/2014  . Pneumococcal Polysaccharide-23 03/06/2008  . Tdap 03/10/2015    TDAP status: Up to date  Flu Vaccine status: Up to date  Pneumococcal vaccine status: Up to date  Covid-19 vaccine status: Completed vaccines  Qualifies for Shingles Vaccine? Yes   Zostavax completed No   Shingrix Completed?: No.    Education has been provided regarding the importance of this vaccine. Patient has been advised to call insurance company to determine out of pocket expense if they have not yet received this vaccine. Advised may also receive vaccine at local pharmacy or Health Dept. Verbalized acceptance and understanding.  Screening Tests Health Maintenance  Topic Date Due  . COVID-19 Vaccine (3 - Inadvertent risk 4-dose series) 03/16/2019  . MAMMOGRAM  05/31/2020  . INFLUENZA VACCINE  08/04/2020  . COLONOSCOPY (Pts 45-95yr Insurance coverage will need to be confirmed)  10/12/2022  . TETANUS/TDAP  03/09/2025  . DEXA SCAN  Completed  . Hepatitis C Screening  Completed  . PNA vac Low Risk Adult  Completed  . HPV VACCINES  Aged Out    Health Maintenance  Health Maintenance Due  Topic Date  Due  . COVID-19 Vaccine (3 - Inadvertent risk 4-dose series) 03/16/2019    Colorectal cancer screening: Type of screening: Colonoscopy. Completed 10/11/17. Repeat every 5 years  Mammogram status: Completed 05/12/19. Repeat every year  Bone Density status: Completed 5/18/210. Results reflect: Bone density results: OSTEOPENIA. Repeat every 2 years.  Lung Cancer Screening: (Low Dose CT Chest recommended if Age 823-80years, 30 pack-year currently smoking OR have quit w/in 15years.) does not qualify.   Additional Screening:  Hepatitis C Screening: does qualify; Completed 10/22/19  Vision Screening: Recommended annual ophthalmology exams for early detection of glaucoma and other disorders of the eye. Is the patient up to date with their annual eye exam?  Yes  Who is the provider or what is the name of the office in which the patient attends annual eye exams? Dr. BGloriann Loan   Dental Screening: Recommended annual dental exams for proper oral hygiene  Community Resource Referral / Chronic Care Management: CRR required this visit?  No   CCM required this visit?  No      Plan:     I have personally reviewed and noted the following in the patient's chart:   . Medical and social history . Use of alcohol, tobacco or illicit drugs  . Current medications and supplements . Functional ability and status . Nutritional status . Physical activity . Advanced directives . List of other physicians . Hospitalizations, surgeries, and ER visits in previous 12 months . Vitals . Screenings to include cognitive, depression, and falls . Referrals and appointments  In addition, I have reviewed and discussed with patient certain preventive protocols, quality metrics, and best  practice recommendations. A written personalized care plan for preventive services as well as general preventive health recommendations were provided to patient.     Clemetine Marker, LPN   [id number]   Nurse Notes: none

## 2020-06-02 ENCOUNTER — Other Ambulatory Visit: Payer: Self-pay | Admitting: Internal Medicine

## 2020-06-02 DIAGNOSIS — E1129 Type 2 diabetes mellitus with other diabetic kidney complication: Secondary | ICD-10-CM

## 2020-06-03 ENCOUNTER — Ambulatory Visit
Admission: RE | Admit: 2020-06-03 | Discharge: 2020-06-03 | Disposition: A | Discharge status: Home / Self Care | Payer: Medicare HMO | Source: Ambulatory Visit | Attending: Internal Medicine | Admitting: Internal Medicine

## 2020-06-03 ENCOUNTER — Other Ambulatory Visit: Payer: Self-pay

## 2020-06-03 DIAGNOSIS — Z1231 Encounter for screening mammogram for malignant neoplasm of breast: Secondary | ICD-10-CM | POA: Insufficient documentation

## 2020-10-08 ENCOUNTER — Ambulatory Visit (INDEPENDENT_AMBULATORY_CARE_PROVIDER_SITE_OTHER): Payer: Medicare HMO | Admitting: Internal Medicine

## 2020-10-08 ENCOUNTER — Other Ambulatory Visit: Payer: Self-pay

## 2020-10-08 ENCOUNTER — Encounter: Payer: Self-pay | Admitting: Internal Medicine

## 2020-10-08 VITALS — BP 104/76 | HR 88 | Temp 98.3°F | Ht 64.0 in | Wt 181.0 lb

## 2020-10-08 DIAGNOSIS — Z23 Encounter for immunization: Secondary | ICD-10-CM | POA: Diagnosis not present

## 2020-10-08 DIAGNOSIS — R7303 Prediabetes: Secondary | ICD-10-CM

## 2020-10-08 DIAGNOSIS — R809 Proteinuria, unspecified: Secondary | ICD-10-CM | POA: Insufficient documentation

## 2020-10-08 MED ORDER — LOSARTAN POTASSIUM 25 MG PO TABS: 25.0000 mg | ORAL_TABLET | Freq: Every day | ORAL | 3 refills | Status: DC

## 2020-10-08 NOTE — Progress Notes (Signed)
  Date:  10/08/2020   Name:  Natalie Gibbs   DOB:  06/09/1940   MRN:  [id number]   Chief Complaint: Prediabetes (Last BS 110 ) and Flu Vaccine  Diabetes She presents for her follow-up diabetic visit. Diabetes type: prediabetes. Her disease course has been stable. There are no hypoglycemic associated symptoms. Pertinent negatives for hypoglycemia include no headaches or tremors. Associated symptoms include weight loss (9 lbs with effort). Pertinent negatives for diabetes include no blurred vision, no chest pain, no fatigue, no foot paresthesias, no polydipsia, no polyuria and no weakness. Pertinent negatives for diabetic complications include no CVA, heart disease, peripheral neuropathy or retinopathy. Current diabetic treatment includes diet. Her weight is decreasing steadily. An ACE inhibitor/angiotensin II receptor blocker is being taken.   Lab Results  Component Value Date   CREATININE 0.77 04/08/2020   BUN 10 04/08/2020   NA 143 04/08/2020   K 4.6 04/08/2020   CL 105 04/08/2020   CO2 23 04/08/2020   Lab Results  Component Value Date   CHOL 146 04/08/2020   HDL 36 (L) 04/08/2020   LDLCALC 95 04/08/2020   TRIG 75 04/08/2020   CHOLHDL 4.1 04/08/2020   Lab Results  Component Value Date   TSH 2.730 04/05/2019   Lab Results  Component Value Date   HGBA1C 6.1 (H) 04/08/2020   Lab Results  Component Value Date   WBC 4.2 04/08/2020   HGB 12.2 04/08/2020   HCT 37.9 04/08/2020   MCV 90 04/08/2020   PLT 239 04/08/2020   Lab Results  Component Value Date   ALT 14 04/08/2020   AST 16 04/08/2020   ALKPHOS 90 04/08/2020   BILITOT 0.4 04/08/2020     Review of Systems  Constitutional:  Positive for weight loss (9 lbs with effort). Negative for appetite change, fatigue, fever and unexpected weight change.  HENT:  Negative for tinnitus and trouble swallowing.   Eyes:  Negative for blurred vision and visual disturbance.  Respiratory:  Negative for cough, chest  tightness and shortness of breath.   Cardiovascular:  Negative for chest pain, palpitations and leg swelling.  Gastrointestinal:  Negative for abdominal pain.  Endocrine: Negative for polydipsia and polyuria.  Genitourinary:  Negative for dysuria and hematuria.  Musculoskeletal:  Negative for arthralgias.  Neurological:  Negative for tremors, weakness, numbness and headaches.  Psychiatric/Behavioral:  Negative for dysphoric mood.    Patient Active Problem List   Diagnosis Date Noted   Osteopenia determined by x-ray 04/05/2019   Abnormal mammogram of right breast 04/19/2017   Intraductal papilloma of breast 10/27/2016   Endometrial hyperplasia 06/14/2016   Prediabetes 05/15/2014   Allergic state 05/15/2014   Fibrocystic breast 05/15/2014   Glaucoma 05/15/2014    Allergies  Allergen Reactions   Iodine Itching   Shellfish Allergy Itching   Codeine Rash    Past Surgical History:  Procedure Laterality Date   BREAST BIOPSY Left 20 + yrs ago   benign   BREAST CYST ASPIRATION Left 2015   neg   BREAST DUCTAL SYSTEM EXCISION Left 10/26/2016   Intraductal papilloma Surgeon: Sankar, Seeplaputhur G, MD;  Location: [facility] ORS;  Service: General;  Laterality: Left;   CATARACT EXTRACTION, BILATERAL Bilateral 2017   COLONOSCOPY  2010   Dr. Oh, [facility]   COLONOSCOPY N/A 05/13/2014   Procedure: COLONOSCOPY;  Surgeon: Paul Y Oh, MD;  Location: [facility] ENDOSCOPY;  Service: Gastroenterology;  Laterality: N/A;   COLONOSCOPY  04/28/2016   Dr. Sankar.     COLONOSCOPY WITH PROPOFOL N/A 10/11/2017   Procedure: COLONOSCOPY WITH PROPOFOL;  Surgeon: Toledo, Teodoro K, MD;  Location: [facility] ENDOSCOPY;  Service: Gastroenterology;  Laterality: N/A;   excision mass abdominal wall  1997   EYE SURGERY  2017   bilateral   TUBAL LIGATION      Social History   Tobacco Use   Smoking status: Never   Smokeless tobacco: Never   Tobacco comments:    smoking cessation materials not required  Vaping Use   Vaping Use:  Never used  Substance Use Topics   Alcohol use: No    Alcohol/week: 0.0 standard drinks   Drug use: No     Medication list has been reviewed and updated.  Current Meds  Medication Sig   Accu-Chek Softclix Lancets lancets 1 each by Other route daily. Use to test blood sugar once daily.   Alcohol Swabs (B-D SINGLE USE SWABS REGULAR) PADS USE AS DIRECTED   aspirin 81 MG tablet Take 81 mg by mouth daily.   blood glucose meter kit and supplies Dispense based on patient and insurance preference. Use up to four times daily as directed. (FOR ICD-10 - E11.9).   brimonidine (ALPHAGAN) 0.2 % ophthalmic solution Place 1 drop into both eyes 2 (two) times daily.    calcium-vitamin D (OSCAL WITH D) 500-200 MG-UNIT tablet Take 1 tablet by mouth.   dorzolamide-timolol (COSOPT) 22.3-6.8 MG/ML ophthalmic solution Place 1 drop into both eyes 2 (two) times daily.   glucose blood (ACCU-CHEK AVIVA) test strip Test blood sugar once daily   losartan (COZAAR) 25 MG tablet Take 25 mg by mouth daily.   [DISCONTINUED] losartan (COZAAR) 25 MG tablet TAKE 1 TABLET EVERY DAY    PHQ 2/9 Scores 10/08/2020 04/23/2020 04/08/2020 10/22/2019  PHQ - 2 Score 0 0 0 0  PHQ- 9 Score 0 - 0 0    GAD 7 : Generalized Anxiety Score 10/08/2020 04/08/2020 10/22/2019  Nervous, Anxious, on Edge 0 0 0  Control/stop worrying 0 0 0  Worry too much - different things 0 0 0  Trouble relaxing 0 0 0  Restless 0 0 0  Easily annoyed or irritable 0 0 0  Afraid - awful might happen 0 0 0  Total GAD 7 Score 0 0 0  Anxiety Difficulty - - Not difficult at all    BP Readings from Last 3 Encounters:  10/08/20 104/76  04/08/20 130/78  10/22/19 122/70    Physical Exam Vitals and nursing note reviewed.  Constitutional:      General: She is not in acute distress.    Appearance: She is well-developed. She is obese.  HENT:     Head: Normocephalic and atraumatic.  Cardiovascular:     Rate and Rhythm: Normal rate and regular rhythm.      Pulses: Normal pulses.  Pulmonary:     Effort: Pulmonary effort is normal. No respiratory distress.     Breath sounds: No wheezing or rhonchi.  Musculoskeletal:     Cervical back: Normal range of motion.     Right lower leg: No edema.     Left lower leg: No edema.  Lymphadenopathy:     Cervical: No cervical adenopathy.  Skin:    General: Skin is warm and dry.     Findings: No rash.  Neurological:     General: No focal deficit present.     Mental Status: She is alert and oriented to person, place, and time.  Psychiatric:          Mood and Affect: Mood normal.        Behavior: Behavior normal.    Wt Readings from Last 3 Encounters:  10/08/20 181 lb (82.1 kg)  04/08/20 191 lb (86.6 kg)  10/22/19 189 lb (85.7 kg)    BP 104/76   Pulse 88   Temp 98.3 F (36.8 C) (Oral)   Ht 5' 4" (1.626 m)   Wt 181 lb (82.1 kg)   BMI 31.07 kg/m   Assessment and Plan: 1. Prediabetes Doing well with diet and weight loss. Continue current lifestyle changes.   Will not check A1C today  2. 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  Dispense: 90 tablet; Refill: 3  3. Need for immunization against influenza - Flu Vaccine QUAD High Dose(Fluad)   Partially dictated using Dragon software. Any errors are unintentional.  Laura Berglund, MD [facility]  10/08/2020       

## 2020-10-10 ENCOUNTER — Telehealth: Payer: Self-pay

## 2020-10-10 ENCOUNTER — Other Ambulatory Visit: Payer: Self-pay

## 2020-10-10 DIAGNOSIS — R809 Proteinuria, unspecified: Secondary | ICD-10-CM

## 2020-10-10 MED ORDER — LOSARTAN POTASSIUM 25 MG PO TABS: 25.0000 mg | ORAL_TABLET | Freq: Every day | ORAL | 3 refills | Status: DC

## 2020-10-10 NOTE — Telephone Encounter (Signed)
Resent Rx to Mail order pharmacy.

## 2020-10-10 NOTE — Telephone Encounter (Signed)
Copied from [place] [phone]. Topic: General - Other >> Oct 10, 2020 12:48 PM Valere Dross wrote: Reason for CRM: Pt called in about her losartan and wants to know if it is the same medication she was taking before because if so she gets it mailed in and not through [facility]. Please advise.

## 2021-02-02 ENCOUNTER — Encounter: Payer: Self-pay | Admitting: Internal Medicine

## 2021-02-02 ENCOUNTER — Ambulatory Visit (INDEPENDENT_AMBULATORY_CARE_PROVIDER_SITE_OTHER): Payer: Medicare HMO | Admitting: Internal Medicine

## 2021-02-02 ENCOUNTER — Other Ambulatory Visit: Payer: Self-pay

## 2021-02-02 VITALS — BP 114/72 | HR 66 | Ht 64.0 in | Wt 186.0 lb

## 2021-02-02 DIAGNOSIS — N3000 Acute cystitis without hematuria: Secondary | ICD-10-CM | POA: Diagnosis not present

## 2021-02-02 LAB — POCT URINALYSIS DIPSTICK
Bilirubin, UA: NEGATIVE
Blood, UA: NEGATIVE
Glucose, UA: NEGATIVE
Ketones, UA: NEGATIVE
Leukocytes, UA: NEGATIVE
Nitrite, UA: NEGATIVE
Protein, UA: NEGATIVE
Spec Grav, UA: 1.01 (ref 1.010–1.025)
Urobilinogen, UA: 0.2 E.U./dL
pH, UA: 6.5 (ref 5.0–8.0)

## 2021-02-02 MED ORDER — SULFAMETHOXAZOLE-TRIMETHOPRIM 800-160 MG PO TABS: 1.0000 | ORAL_TABLET | Freq: Two times a day (BID) | ORAL | 0 refills | Status: AC

## 2021-02-02 NOTE — Progress Notes (Signed)
Date:  02/02/2021   Name:  Natalie Gibbs   DOB:  1940-11-01   MRN:  [id number]   Chief Complaint: Cystitis  Urinary Tract Infection  This is a new problem. Episode onset: 1 week. The problem occurs every urination. The pain is mild. There has been no fever. She is Not sexually active. There is No history of pyelonephritis. Associated symptoms include frequency and urgency. Pertinent negatives include no chills or hematuria. She has tried nothing for the symptoms.   Lab Results  Component Value Date   NA 143 04/08/2020   K 4.6 04/08/2020   CO2 23 04/08/2020   GLUCOSE 125 (H) 04/08/2020   BUN 10 04/08/2020   CREATININE 0.77 04/08/2020   CALCIUM 9.2 04/08/2020   EGFR 79 04/08/2020   GFRNONAA 72 04/05/2019   Lab Results  Component Value Date   CHOL 146 04/08/2020   HDL 36 (L) 04/08/2020   LDLCALC 95 04/08/2020   TRIG 75 04/08/2020   CHOLHDL 4.1 04/08/2020   Lab Results  Component Value Date   TSH 2.730 04/05/2019   Lab Results  Component Value Date   HGBA1C 6.1 (H) 04/08/2020   Lab Results  Component Value Date   WBC 4.2 04/08/2020   HGB 12.2 04/08/2020   HCT 37.9 04/08/2020   MCV 90 04/08/2020   PLT 239 04/08/2020   Lab Results  Component Value Date   ALT 14 04/08/2020   AST 16 04/08/2020   ALKPHOS 90 04/08/2020   BILITOT 0.4 04/08/2020   Lab Results  Component Value Date   VD25OH 31.6 04/05/2019     Review of Systems  Constitutional:  Negative for chills, fatigue and fever.  Respiratory:  Negative for cough, chest tightness and shortness of breath.   Cardiovascular:  Negative for chest pain.  Genitourinary:  Positive for frequency and urgency. Negative for dysuria and hematuria.  Psychiatric/Behavioral:  Negative for dysphoric mood and sleep disturbance. The patient is not nervous/anxious.    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 10/08/2020   Osteopenia determined by x-ray 04/05/2019    Abnormal mammogram of right breast 04/19/2017   Intraductal papilloma of breast 10/27/2016   Endometrial hyperplasia 06/14/2016   Prediabetes 05/15/2014   Allergic state 05/15/2014   Fibrocystic breast 05/15/2014   Glaucoma 05/15/2014    Allergies  Allergen Reactions   Iodine Itching   Shellfish Allergy Itching   Codeine Rash    Past Surgical History:  Procedure Laterality Date   BREAST BIOPSY Left 20 + yrs ago   benign   BREAST CYST ASPIRATION Left 2015   neg   BREAST DUCTAL SYSTEM EXCISION Left 10/26/2016   Intraductal papilloma Surgeon: Christene Lye, MD;  Location: [facility] ORS;  Service: General;  Laterality: Left;   CATARACT EXTRACTION, BILATERAL Bilateral 2017   COLONOSCOPY  2010   Dr. Candace Cruise, [facility]   COLONOSCOPY N/A 05/13/2014   Procedure: COLONOSCOPY;  Surgeon: Hulen Luster, MD;  Location: [facility] ENDOSCOPY;  Service: Gastroenterology;  Laterality: N/A;   COLONOSCOPY  04/28/2016   Dr. Jamal Collin.   COLONOSCOPY WITH PROPOFOL N/A 10/11/2017   Procedure: COLONOSCOPY WITH PROPOFOL;  Surgeon: Toledo, Benay Pike, MD;  Location: [facility] ENDOSCOPY;  Service: Gastroenterology;  Laterality: N/A;   excision mass abdominal wall  1997   EYE SURGERY  2017   bilateral   TUBAL LIGATION      Social History   Tobacco Use   Smoking  status: Never   Smokeless tobacco: Never   Tobacco comments:    smoking cessation materials not required  Vaping Use   Vaping Use: Never used  Substance Use Topics   Alcohol use: No    Alcohol/week: 0.0 standard drinks   Drug use: No     Medication list has been reviewed and updated.  Current Meds  Medication Sig   Accu-Chek Softclix Lancets lancets 1 each by Other route daily. Use to test blood sugar once daily.   Alcohol Swabs (B-D SINGLE USE SWABS REGULAR) PADS USE AS DIRECTED   aspirin 81 MG tablet Take 81 mg by mouth daily.   blood glucose meter kit and supplies Dispense based on patient and insurance preference. Use up to four times daily as  directed. (FOR ICD-10 - E11.9).   brimonidine (ALPHAGAN) 0.2 % ophthalmic solution Place 1 drop into both eyes 2 (two) times daily.    calcium-vitamin D (OSCAL WITH D) 500-200 MG-UNIT tablet Take 1 tablet by mouth.   dorzolamide-timolol (COSOPT) 22.3-6.8 MG/ML ophthalmic solution Place 1 drop into both eyes 2 (two) times daily.   glucose blood (ACCU-CHEK AVIVA) test strip Test blood sugar once daily   losartan (COZAAR) 25 MG tablet Take 1 tablet (25 mg total) by mouth daily.    PHQ 2/9 Scores 02/02/2021 10/08/2020 04/23/2020 04/08/2020  PHQ - 2 Score 0 0 0 0  PHQ- 9 Score 0 0 - 0    GAD 7 : Generalized Anxiety Score 02/02/2021 10/08/2020 04/08/2020 10/22/2019  Nervous, Anxious, on Edge 0 0 0 0  Control/stop worrying 0 0 0 0  Worry too much - different things 0 0 0 0  Trouble relaxing 0 0 0 0  Restless 0 0 0 0  Easily annoyed or irritable 0 0 0 0  Afraid - awful might happen 0 0 0 0  Total GAD 7 Score 0 0 0 0  Anxiety Difficulty - - - Not difficult at all    BP Readings from Last 3 Encounters:  02/02/21 114/72  10/08/20 104/76  04/08/20 130/78    Physical Exam Vitals and nursing note reviewed.  Constitutional:      Appearance: Normal appearance. She is well-developed.  Cardiovascular:     Rate and Rhythm: Normal rate and regular rhythm.     Heart sounds: Normal heart sounds.  Pulmonary:     Effort: Pulmonary effort is normal. No respiratory distress.     Breath sounds: Normal breath sounds.  Abdominal:     General: Bowel sounds are normal.     Palpations: Abdomen is soft.     Tenderness: There is no abdominal tenderness. There is no right CVA tenderness, left CVA tenderness, guarding or rebound.    Wt Readings from Last 3 Encounters:  02/02/21 186 lb (84.4 kg)  10/08/20 181 lb (82.1 kg)  04/08/20 191 lb (86.6 kg)    BP 114/72    Pulse 66    Ht _0  (1.626 m)    Wt 186 lb (84.4 kg)    SpO2 96%    BMI 31.93 kg/m   Assessment and Plan: 1. Acute cystitis without  hematuria UA unremarkable but symptoms are very suggestive - suspect this is partially resolved. Will treat with 3 day course of Bactrim. Follow up if sx continue. - sulfamethoxazole-trimethoprim (BACTRIM DS) 800-160 MG tablet; Take 1 tablet by mouth 2 (two) times daily for 3 days.  Dispense: 6 tablet; Refill: 0 - POCT urinalysis dipstick   Partially dictated using  Editor, commissioning. Any errors are unintentional.  Halina Maidens, MD [facility]  02/02/2021

## 2021-02-06 ENCOUNTER — Encounter: Payer: Self-pay | Admitting: Internal Medicine

## 2021-02-06 ENCOUNTER — Other Ambulatory Visit: Payer: Self-pay

## 2021-02-06 ENCOUNTER — Ambulatory Visit (INDEPENDENT_AMBULATORY_CARE_PROVIDER_SITE_OTHER): Payer: Medicare HMO | Admitting: Internal Medicine

## 2021-02-06 ENCOUNTER — Ambulatory Visit: Payer: Self-pay | Admitting: *Deleted

## 2021-02-06 VITALS — BP 136/76 | HR 63 | Temp 97.9°F | Ht 64.0 in | Wt 176.0 lb

## 2021-02-06 DIAGNOSIS — R399 Unspecified symptoms and signs involving the genitourinary system: Secondary | ICD-10-CM | POA: Diagnosis not present

## 2021-02-06 DIAGNOSIS — Z23 Encounter for immunization: Secondary | ICD-10-CM

## 2021-02-06 LAB — POCT URINALYSIS DIPSTICK
Bilirubin, UA: NEGATIVE
Blood, UA: NEGATIVE
Glucose, UA: NEGATIVE
Ketones, UA: NEGATIVE
Leukocytes, UA: NEGATIVE
Nitrite, UA: NEGATIVE
Protein, UA: NEGATIVE
Spec Grav, UA: 1.025 (ref 1.010–1.025)
Urobilinogen, UA: 0.2 E.U./dL
pH, UA: 6 (ref 5.0–8.0)

## 2021-02-06 MED ORDER — PHENAZOPYRIDINE HCL 95 MG PO TABS: 95.0000 mg | ORAL_TABLET | Freq: Three times a day (TID) | ORAL | 0 refills | Status: DC | PRN

## 2021-02-06 NOTE — Telephone Encounter (Signed)
Summary: burning with urination   When pt goes to the bathroom she has burning/ pt stated the sulfamethoxazole-trimethoprim (BACTRIM DS) 800-160 MG tablet is not working well/ please advise      Reason for Disposition  [1] Taking antibiotic > 72 hours (3 days) for UTI AND [2] painful urination or frequency not improved  Answer Assessment - Initial Assessment Questions 1. ANTIBIOTIC: "What antibiotic are you taking?" "How many times per day?"     Bactrim- finished  antibiotic 2. DURATION: "When was the antibiotic started?"     Monday 3. MAIN SYMPTOM: "What is the main symptom you are concerned about?"     Burning with urination- new symptoms 4. FEVER: "Do you have a fever?" If Yes, ask: "What is it, how was it measured, and when did it start?"     no 5. OTHER SYMPTOMS: "Do you have any other symptoms?" (e.g., flank pain, vaginal discharge, blood in urine)     no  Protocols used: Urinary Tract Infection on Antibiotic Follow-up Call - [facility]

## 2021-02-06 NOTE — Telephone Encounter (Signed)
°  Chief Complaint: painful urination Symptoms: pain with and after urination  Frequency:   Pertinent Negatives: Patient denies other symptoms Disposition: [] ED /[] Urgent Care (no appt availability in office) / [] Appointment(In office/virtual)/ []  [place] [facility]/ [] Home Care/ [] Refused Recommended Disposition /[] [place] [facility]/ [x]  Follow-up with PCP Additional Notes: Patient advised Monday to contact PCP if antibiotic did not help- she is calling to let PCP know that she has startde new symptoms- burning with/after urination

## 2021-02-06 NOTE — Patient Instructions (Signed)
Take AZO through the weekend.  If no improvement in urinary symptoms, call on Monday for advice.

## 2021-02-06 NOTE — Progress Notes (Signed)
Date:  02/06/2021   Name:  Natalie Gibbs   DOB:  01/30/1940   MRN:  [id number]   Chief Complaint: Urinary Tract Infection  Urinary Tract Infection  This is a new problem. The current episode started in the past 7 days (3 days). The problem occurs every urination. The quality of the pain is described as burning (after urination). The pain is at a severity of 4/10. The patient is experiencing no pain. There has been no fever. She is Not sexually active. There is No history of pyelonephritis. Associated symptoms include frequency. Pertinent negatives include no chills, hematuria or urgency. Treatments tried: just completed Bactrim. The treatment provided mild relief.   Lab Results  Component Value Date   NA 143 04/08/2020   K 4.6 04/08/2020   CO2 23 04/08/2020   GLUCOSE 125 (H) 04/08/2020   BUN 10 04/08/2020   CREATININE 0.77 04/08/2020   CALCIUM 9.2 04/08/2020   EGFR 79 04/08/2020   GFRNONAA 72 04/05/2019   Lab Results  Component Value Date   CHOL 146 04/08/2020   HDL 36 (L) 04/08/2020   LDLCALC 95 04/08/2020   TRIG 75 04/08/2020   CHOLHDL 4.1 04/08/2020   Lab Results  Component Value Date   TSH 2.730 04/05/2019   Lab Results  Component Value Date   HGBA1C 6.1 (H) 04/08/2020   Lab Results  Component Value Date   WBC 4.2 04/08/2020   HGB 12.2 04/08/2020   HCT 37.9 04/08/2020   MCV 90 04/08/2020   PLT 239 04/08/2020   Lab Results  Component Value Date   ALT 14 04/08/2020   AST 16 04/08/2020   ALKPHOS 90 04/08/2020   BILITOT 0.4 04/08/2020   Lab Results  Component Value Date   VD25OH 31.6 04/05/2019     Review of Systems  Constitutional:  Negative for chills, fatigue and fever.  Respiratory:  Negative for cough, chest tightness and shortness of breath.   Cardiovascular:  Negative for chest pain.  Genitourinary:  Positive for frequency. Negative for dysuria, hematuria and urgency.   Patient Active Problem List   Diagnosis Date Noted   Urine test  positive for microalbuminuria 10/08/2020   Osteopenia determined by x-ray 04/05/2019   Abnormal mammogram of right breast 04/19/2017   Intraductal papilloma of breast 10/27/2016   Endometrial hyperplasia 06/14/2016   Prediabetes 05/15/2014   Allergic state 05/15/2014   Fibrocystic breast 05/15/2014   Glaucoma 05/15/2014    Allergies  Allergen Reactions   Iodine Itching   Shellfish Allergy Itching   Codeine Rash    Past Surgical History:  Procedure Laterality Date   BREAST BIOPSY Left 20 + yrs ago   benign   BREAST CYST ASPIRATION Left 2015   neg   BREAST DUCTAL SYSTEM EXCISION Left 10/26/2016   Intraductal papilloma Surgeon: Christene Lye, MD;  Location: [facility] ORS;  Service: General;  Laterality: Left;   CATARACT EXTRACTION, BILATERAL Bilateral 2017   COLONOSCOPY  2010   Dr. Candace Cruise, [facility]   COLONOSCOPY N/A 05/13/2014   Procedure: COLONOSCOPY;  Surgeon: Hulen Luster, MD;  Location: [facility] ENDOSCOPY;  Service: Gastroenterology;  Laterality: N/A;   COLONOSCOPY  04/28/2016   Dr. Jamal Collin.   COLONOSCOPY WITH PROPOFOL N/A 10/11/2017   Procedure: COLONOSCOPY WITH PROPOFOL;  Surgeon: Toledo, Benay Pike, MD;  Location: [facility] ENDOSCOPY;  Service: Gastroenterology;  Laterality: N/A;   excision mass abdominal wall  1997   EYE SURGERY  2017   bilateral   TUBAL  LIGATION      Social History   Tobacco Use   Smoking status: Never   Smokeless tobacco: Never   Tobacco comments:    smoking cessation materials not required  Vaping Use   Vaping Use: Never used  Substance Use Topics   Alcohol use: No    Alcohol/week: 0.0 standard drinks   Drug use: No     Medication list has been reviewed and updated.  Current Meds  Medication Sig   phenazopyridine (AZO URINARY PAIN RELIEF) 95 MG tablet Take 1 tablet (95 mg total) by mouth 3 (three) times daily as needed for pain.    PHQ 2/9 Scores 02/06/2021 02/02/2021 10/08/2020 04/23/2020  PHQ - 2 Score 0 0 0 0  PHQ- 9 Score 0 0 0 -    GAD 7 :  Generalized Anxiety Score 02/06/2021 02/02/2021 10/08/2020 04/08/2020  Nervous, Anxious, on Edge 0 0 0 0  Control/stop worrying 0 0 0 0  Worry too much - different things 0 0 0 0  Trouble relaxing 0 0 0 0  Restless 0 0 0 0  Easily annoyed or irritable 0 0 0 0  Afraid - awful might happen 0 0 0 0  Total GAD 7 Score 0 0 0 0  Anxiety Difficulty - - - -    BP Readings from Last 3 Encounters:  02/06/21 136/76  02/02/21 114/72  10/08/20 104/76    Physical Exam Vitals and nursing note reviewed.  Constitutional:      Appearance: She is well-developed.  Cardiovascular:     Rate and Rhythm: Normal rate and regular rhythm.     Heart sounds: Normal heart sounds.  Pulmonary:     Effort: Pulmonary effort is normal. No respiratory distress.     Breath sounds: Normal breath sounds.  Abdominal:     General: Bowel sounds are normal.     Palpations: Abdomen is soft.     Tenderness: There is no abdominal tenderness. There is no right CVA tenderness, left CVA tenderness, guarding or rebound.    Wt Readings from Last 3 Encounters:  02/06/21 176 lb (79.8 kg)  02/02/21 186 lb (84.4 kg)  10/08/20 181 lb (82.1 kg)    BP 136/76    Pulse 63    Temp 97.9 F (36.6 C) (Oral)    Ht 5' 4"  (1.626 m)    Wt 176 lb (79.8 kg)    SpO2 95%    BMI 30.21 kg/m   Assessment and Plan: 1. UTI symptoms Recently completed 3 days of Bactrim with modest improvement. Will try AZO for comfort.  If persistent sx, may try estrogen vaginal cream to uretral meatus. - POCT urinalysis dipstick - phenazopyridine (AZO URINARY PAIN RELIEF) 95 MG tablet; Take 1 tablet (95 mg total) by mouth 3 (three) times daily as needed for pain.  Dispense: 10 tablet; Refill: 0  2. Need for immunization against influenza - Flu Vaccine QUAD High Dose(Fluad)   Partially dictated using Editor, commissioning. Any errors are unintentional.  Halina Maidens, MD [facility]  02/06/2021

## 2021-02-09 ENCOUNTER — Other Ambulatory Visit: Payer: Self-pay | Admitting: Internal Medicine

## 2021-02-09 ENCOUNTER — Telehealth: Payer: Self-pay | Admitting: Internal Medicine

## 2021-02-09 DIAGNOSIS — R300 Dysuria: Secondary | ICD-10-CM

## 2021-02-09 MED ORDER — ESTRADIOL 0.1 MG/GM VA CREA: TOPICAL_CREAM | VAGINAL | 0 refills | Status: DC

## 2021-02-09 NOTE — Telephone Encounter (Signed)
Copied from [place] [phone]. Topic: General - Call Back - No Documentation >> Feb 09, 2021  1:57 PM Erick Blinks wrote: Reason for CRM: Pt called requesting to speak to PCP regarding med/symptom updates. Says she is feeling much better, still a slight burn in her urine but much better.  Best contact: [phone]

## 2021-04-13 ENCOUNTER — Ambulatory Visit (INDEPENDENT_AMBULATORY_CARE_PROVIDER_SITE_OTHER): Payer: Medicare HMO | Admitting: Internal Medicine

## 2021-04-13 ENCOUNTER — Encounter: Payer: Self-pay | Admitting: Internal Medicine

## 2021-04-13 VITALS — BP 102/68 | HR 66 | Resp 14 | Ht 64.0 in | Wt 181.6 lb

## 2021-04-13 DIAGNOSIS — R7303 Prediabetes: Secondary | ICD-10-CM

## 2021-04-13 DIAGNOSIS — Z0000 Encounter for general adult medical examination without abnormal findings: Secondary | ICD-10-CM

## 2021-04-13 DIAGNOSIS — Z1231 Encounter for screening mammogram for malignant neoplasm of breast: Secondary | ICD-10-CM | POA: Diagnosis not present

## 2021-04-13 DIAGNOSIS — R809 Proteinuria, unspecified: Secondary | ICD-10-CM

## 2021-04-13 DIAGNOSIS — M8580 Other specified disorders of bone density and structure, unspecified site: Secondary | ICD-10-CM

## 2021-04-13 NOTE — Progress Notes (Signed)
? ? ?Date:  04/13/2021  ? ?Name:  Natalie Gibbs   DOB:  15-Feb-1940   MRN:  [id number] ? ? ?Chief Complaint: Annual Exam (Breast Exam.) ?Natalie Gibbs is a 81 y.o. female who presents today for her Complete Annual Exam. She feels well. She reports some exercising. She reports she is sleeping fairly well. Breast complaints - none. ? ?Mammogram: 05/2020 ?DEXA: 05/2018 osteopenia ?Pap smear: discontinued ?Colonoscopy: 10/2017 repeat 5 yrs ? ?There are no preventive care reminders to display for this patient. ?  ?Immunization History  ?Administered Date(s) Administered  ? Fluad Quad(high Dose 65+) 10/22/2019, 10/08/2020, 02/06/2021  ? Influenza,inj,Quad PF,6+ Mos 09/10/2015, 03/23/2017  ? PFIZER(Purple Top)SARS-COV-2 Vaccination 01/31/2019, 02/16/2019  ? Pneumococcal Conjugate-13 03/06/2014  ? Pneumococcal Polysaccharide-23 03/06/2008  ? Tdap 03/10/2015  ? ? ?Diabetes ?She presents for her follow-up diabetic visit. Diabetes type: prediabetes. Her disease course has been stable. Pertinent negatives for hypoglycemia include no dizziness, headaches, nervousness/anxiousness or tremors. Pertinent negatives for diabetes include no chest pain, no fatigue, no polydipsia and no polyuria. Current diabetic treatment includes diet. Her breakfast blood glucose is taken between 6-7 am. Her breakfast blood glucose range is generally 90-110 mg/dl. An ACE inhibitor/angiotensin II receptor blocker is being taken.  ? ?Lab Results  ?Component Value Date  ? NA 143 04/08/2020  ? K 4.6 04/08/2020  ? CO2 23 04/08/2020  ? GLUCOSE 125 (H) 04/08/2020  ? BUN 10 04/08/2020  ? CREATININE 0.77 04/08/2020  ? CALCIUM 9.2 04/08/2020  ? EGFR 79 04/08/2020  ? GFRNONAA 72 04/05/2019  ? ?Lab Results  ?Component Value Date  ? CHOL 146 04/08/2020  ? HDL 36 (L) 04/08/2020  ? [place] 95 04/08/2020  ? TRIG 75 04/08/2020  ? CHOLHDL 4.1 04/08/2020  ? ?Lab Results  ?Component Value Date  ? TSH 2.730 04/05/2019  ? ?Lab Results  ?Component Value Date   ? HGBA1C 6.1 (H) 04/08/2020  ? ?Lab Results  ?Component Value Date  ? WBC 4.2 04/08/2020  ? HGB 12.2 04/08/2020  ? HCT 37.9 04/08/2020  ? MCV 90 04/08/2020  ? PLT 239 04/08/2020  ? ?Lab Results  ?Component Value Date  ? ALT 14 04/08/2020  ? AST 16 04/08/2020  ? ALKPHOS 90 04/08/2020  ? BILITOT 0.4 04/08/2020  ? ?Lab Results  ?Component Value Date  ? VD25OH 31.6 04/05/2019  ?  ? ?Review of Systems  ?Constitutional:  Negative for chills, fatigue and fever.  ?HENT:  Negative for congestion, hearing loss, tinnitus, trouble swallowing and voice change.   ?Eyes:  Negative for visual disturbance.  ?Respiratory:  Negative for cough, chest tightness, shortness of breath and wheezing.   ?Cardiovascular:  Negative for chest pain, palpitations and leg swelling.  ?Gastrointestinal:  Negative for abdominal pain, constipation, diarrhea and vomiting.  ?Endocrine: Negative for polydipsia and polyuria.  ?Genitourinary:  Negative for dysuria, frequency, genital sores, vaginal bleeding and vaginal discharge.  ?Musculoskeletal:  Negative for arthralgias, gait problem and joint swelling.  ?Skin:  Negative for color change and rash.  ?Neurological:  Negative for dizziness, tremors, light-headedness and headaches.  ?Hematological:  Negative for adenopathy. Does not bruise/bleed easily.  ?Psychiatric/Behavioral:  Negative for dysphoric mood and sleep disturbance. The patient is not nervous/anxious.   ? ?Patient Active Problem List  ? Diagnosis Date Noted  ? Urine test positive for microalbuminuria 10/08/2020  ? Osteopenia determined by x-ray 04/05/2019  ? Abnormal mammogram of right breast 04/19/2017  ? Intraductal papilloma of breast 10/27/2016  ? Endometrial  hyperplasia 06/14/2016  ? Prediabetes 05/15/2014  ? Allergic state 05/15/2014  ? Fibrocystic breast 05/15/2014  ? Glaucoma 05/15/2014  ? ? ?Allergies  ?Allergen Reactions  ? Iodine Itching  ? Shellfish Allergy Itching  ? Codeine Rash  ? ? ?Past Surgical History:  ?Procedure  Laterality Date  ? BREAST BIOPSY Left 20 + yrs ago  ? benign  ? BREAST CYST ASPIRATION Left 2015  ? neg  ? BREAST DUCTAL SYSTEM EXCISION Left 10/26/2016  ? Intraductal papilloma Surgeon: Christene Lye, MD;  Location: [facility] ORS;  Service: General;  Laterality: Left;  ? CATARACT EXTRACTION, BILATERAL Bilateral 2017  ? COLONOSCOPY  2010  ? Dr. Candace Cruise, [facility]  ? COLONOSCOPY N/A 05/13/2014  ? Procedure: COLONOSCOPY;  Surgeon: Hulen Luster, MD;  Location: [facility] ENDOSCOPY;  Service: Gastroenterology;  Laterality: N/A;  ? COLONOSCOPY  04/28/2016  ? Dr. Jamal Collin.  ? COLONOSCOPY WITH PROPOFOL N/A 10/11/2017  ? Procedure: COLONOSCOPY WITH PROPOFOL;  Surgeon: Toledo, Benay Pike, MD;  Location: [facility] ENDOSCOPY;  Service: Gastroenterology;  Laterality: N/A;  ? excision mass abdominal wall  1997  ? EYE SURGERY  2017  ? bilateral  ? TUBAL LIGATION    ? ? ?Social History  ? ?Tobacco Use  ? Smoking status: Never  ? Smokeless tobacco: Never  ? Tobacco comments:  ?  smoking cessation materials not required  ?Vaping Use  ? Vaping Use: Never used  ?Substance Use Topics  ? Alcohol use: No  ?  Alcohol/week: 0.0 standard drinks  ? Drug use: No  ? ? ? ?Medication list has been reviewed and updated. ? ?Current Meds  ?Medication Sig  ? Accu-Chek Softclix Lancets lancets 1 each by Other route daily. Use to test blood sugar once daily.  ? Alcohol Swabs (B-D SINGLE USE SWABS REGULAR) PADS USE AS DIRECTED  ? aspirin 81 MG tablet Take 81 mg by mouth daily.  ? blood glucose meter kit and supplies Dispense based on patient and insurance preference. Use up to four times daily as directed. (FOR ICD-10 - E11.9).  ? brimonidine (ALPHAGAN) 0.2 % ophthalmic solution Place 1 drop into both eyes 2 (two) times daily.   ? calcium-vitamin D (OSCAL WITH D) 500-200 MG-UNIT tablet Take 1 tablet by mouth.  ? dorzolamide-timolol (COSOPT) 22.3-6.8 MG/ML ophthalmic solution Place 1 drop into both eyes 2 (two) times daily.  ? glucose blood (ACCU-CHEK AVIVA) test strip Test  blood sugar once daily  ? losartan (COZAAR) 25 MG tablet Take 1 tablet (25 mg total) by mouth daily.  ? meloxicam (MOBIC) 15 MG tablet Mobic 15 mg tablet ? Take 1 tablet(s) every day by oral route.  ? ? ? ?  04/13/2021  ?  8:42 AM 02/06/2021  ?  3:55 PM 02/02/2021  ? 10:44 AM 10/08/2020  ? 10:00 AM  ?GAD 7 : Generalized Anxiety Score  ?Nervous, Anxious, on Edge 0 0 0 0  ?Control/stop worrying 0 0 0 0  ?Worry too much - different things 0 0 0 0  ?Trouble relaxing 0 0 0 0  ?Restless 0 0 0 0  ?Easily annoyed or irritable 0 0 0 0  ?Afraid - awful might happen 0 0 0 0  ?Total GAD 7 Score 0 0 0 0  ?Anxiety Difficulty Not difficult at all     ? ? ? ?  04/13/2021  ?  8:42 AM  ?Depression screen PHQ 2/9  ?Decreased Interest 0  ?Down, Depressed, Hopeless 0  ?PHQ - 2 Score 0  ?  Altered sleeping 0  ?Tired, decreased energy 0  ?Change in appetite 0  ?Feeling bad or failure about yourself  0  ?Trouble concentrating 0  ?Moving slowly or fidgety/restless 0  ?Suicidal thoughts 0  ?PHQ-9 Score 0  ?Difficult doing work/chores Not difficult at all  ? ? ?BP Readings from Last 3 Encounters:  ?04/13/21 102/68  ?02/06/21 136/76  ?02/02/21 114/72  ? ? ?Physical Exam ?Vitals and nursing note reviewed.  ?Constitutional:   ?   General: She is not in acute distress. ?   Appearance: She is well-developed.  ?HENT:  ?   Head: Normocephalic and atraumatic.  ?   Right Ear: Tympanic membrane and ear canal normal.  ?   Left Ear: Tympanic membrane and ear canal normal.  ?   Nose:  ?   Right Sinus: No maxillary sinus tenderness.  ?   Left Sinus: No maxillary sinus tenderness.  ?Eyes:  ?   General: No scleral icterus.    ?   Right eye: No discharge.     ?   Left eye: No discharge.  ?   Conjunctiva/sclera: Conjunctivae normal.  ?Neck:  ?   Thyroid: No thyromegaly.  ?   Vascular: No carotid bruit.  ?Cardiovascular:  ?   Rate and Rhythm: Normal rate and regular rhythm.  ?   Pulses: Normal pulses.  ?   Heart sounds: Normal heart sounds.  ?Pulmonary:  ?   Effort:  Pulmonary effort is normal. No respiratory distress.  ?   Breath sounds: No wheezing.  ?Chest:  ?Breasts: ?   Right: No mass, nipple discharge, skin change or tenderness.  ?   Left: No mass, nipple discharge, skin c

## 2021-04-15 LAB — COMPREHENSIVE METABOLIC PANEL
ALT: 18 IU/L (ref 0–32)
AST: 19 IU/L (ref 0–40)
Albumin/Globulin Ratio: 1.4 (ref 1.2–2.2)
Albumin: 4 g/dL (ref 3.7–4.7)
Alkaline Phosphatase: 91 IU/L (ref 44–121)
BUN/Creatinine Ratio: 18 (ref 12–28)
BUN: 13 mg/dL (ref 8–27)
Bilirubin Total: 0.3 mg/dL (ref 0.0–1.2)
CO2: 25 mmol/L (ref 20–29)
Calcium: 9.3 mg/dL (ref 8.7–10.3)
Chloride: 103 mmol/L (ref 96–106)
Creatinine, Ser: 0.73 mg/dL (ref 0.57–1.00)
Globulin, Total: 2.9 g/dL (ref 1.5–4.5)
Glucose: 119 mg/dL — ABNORMAL HIGH (ref 70–99)
Potassium: 4.5 mmol/L (ref 3.5–5.2)
Sodium: 139 mmol/L (ref 134–144)
Total Protein: 6.9 g/dL (ref 6.0–8.5)
eGFR: 84 mL/min/{1.73_m2} (ref >59)

## 2021-04-15 LAB — MICROALBUMIN / CREATININE URINE RATIO
Creatinine, Urine: 139.7 mg/dL
Microalb/Creat Ratio: 113 mg/g creat — ABNORMAL HIGH (ref 0–29)
Microalbumin, Urine: 157.4 ug/mL

## 2021-04-15 LAB — CBC WITH DIFFERENTIAL/PLATELET
Basophils Absolute: 0 10*3/uL (ref 0.0–0.2)
Basos: 1 %
EOS (ABSOLUTE): 0.1 10*3/uL (ref 0.0–0.4)
Eos: 3 %
Hematocrit: 37.7 % (ref 34.0–46.6)
Hemoglobin: 12 g/dL (ref 11.1–15.9)
Immature Grans (Abs): 0 10*3/uL (ref 0.0–0.1)
Immature Granulocytes: 0 %
Lymphocytes Absolute: 1.7 10*3/uL (ref 0.7–3.1)
Lymphs: 37 %
MCH: 28.6 pg (ref 26.6–33.0)
MCHC: 31.8 g/dL (ref 31.5–35.7)
MCV: 90 fL (ref 79–97)
Monocytes Absolute: 0.3 10*3/uL (ref 0.1–0.9)
Monocytes: 7 %
Neutrophils Absolute: 2.4 10*3/uL (ref 1.4–7.0)
Neutrophils: 52 %
Platelets: 249 10*3/uL (ref 150–450)
RBC: 4.2 x10E6/uL (ref 3.77–5.28)
RDW: 12 % (ref 11.7–15.4)
WBC: 4.5 10*3/uL (ref 3.4–10.8)

## 2021-04-15 LAB — HEMOGLOBIN A1C
Est. average glucose Bld gHb Est-mCnc: 123 mg/dL
Hgb A1c MFr Bld: 5.9 % — ABNORMAL HIGH (ref 4.8–5.6)

## 2021-04-15 LAB — VITAMIN D 25 HYDROXY (VIT D DEFICIENCY, FRACTURES): Vit D, 25-Hydroxy: 32.5 ng/mL (ref 30.0–100.0)

## 2021-04-15 LAB — LIPID PANEL
Chol/HDL Ratio: 3.8 ratio (ref 0.0–4.4)
Cholesterol, Total: 141 mg/dL (ref 100–199)
HDL: 37 mg/dL — ABNORMAL LOW (ref >39)
LDL Chol Calc (NIH): 89 mg/dL (ref 0–99)
Triglycerides: 74 mg/dL (ref 0–149)
VLDL Cholesterol Cal: 15 mg/dL (ref 5–40)

## 2021-04-15 LAB — TSH: TSH: 1.64 u[IU]/mL (ref 0.450–4.500)

## 2021-04-27 ENCOUNTER — Ambulatory Visit: Payer: Medicare HMO

## 2021-04-28 ENCOUNTER — Telehealth: Payer: Self-pay | Admitting: Internal Medicine

## 2021-04-28 NOTE — Telephone Encounter (Signed)
Copied from [place] [phone]. Topic: Medicare AWV ?>> Apr 28, 2021  1:20 PM Cher Nakai R wrote: ?Reason for CRM:  ?No answer unable to leave a message for patient to call back and schedule Medicare Annual Wellness Visit (AWV) in office.  ? ?If unable to come into the office for AWV,  please offer to do virtually or by telephone. ? ?Last AWV: 04/23/2020 ? ?Please schedule at anytime with [facility] Health Advisor.     ? ?30 minute appointment for Virtual or phone ?45 minute appointment for in office or Initial virtual/phone ? ?Any questions, please call me at [phone] ?

## 2021-06-02 ENCOUNTER — Encounter: Payer: Self-pay | Admitting: Internal Medicine

## 2021-06-02 DIAGNOSIS — E785 Hyperlipidemia, unspecified: Secondary | ICD-10-CM | POA: Insufficient documentation

## 2021-06-09 ENCOUNTER — Ambulatory Visit
Admission: RE | Admit: 2021-06-09 | Discharge: 2021-06-09 | Disposition: A | Discharge status: Home / Self Care | Payer: Medicare HMO | Source: Ambulatory Visit | Attending: Internal Medicine | Admitting: Internal Medicine

## 2021-06-09 DIAGNOSIS — M85851 Other specified disorders of bone density and structure, right thigh: Secondary | ICD-10-CM | POA: Insufficient documentation

## 2021-06-09 DIAGNOSIS — M8580 Other specified disorders of bone density and structure, unspecified site: Secondary | ICD-10-CM | POA: Insufficient documentation

## 2021-06-09 DIAGNOSIS — Z1231 Encounter for screening mammogram for malignant neoplasm of breast: Secondary | ICD-10-CM | POA: Insufficient documentation

## 2021-06-09 DIAGNOSIS — Z780 Asymptomatic menopausal state: Secondary | ICD-10-CM | POA: Insufficient documentation

## 2021-06-09 DIAGNOSIS — Z13820 Encounter for screening for osteoporosis: Secondary | ICD-10-CM | POA: Insufficient documentation

## 2021-06-16 ENCOUNTER — Telehealth: Payer: Self-pay | Admitting: Internal Medicine

## 2021-06-16 NOTE — Telephone Encounter (Signed)
Copied from [place] [phone]. Topic: Medicare AWV >> Jun 16, 2021  2:06 PM Jae Dire wrote: Reason for CRM:  Left message for patient to call back and schedule Medicare Annual Wellness Visit (AWV) in office.   If unable to come into the office for AWV,  please offer to do virtually or by telephone.  Last AWV: 04/23/2020  Please schedule at anytime with [facility] Health Advisor.      30 minute appointment for Virtual or phone 45 minute appointment for in office or Initial virtual/phone  Any questions, please call me at [phone]

## 2021-06-22 ENCOUNTER — Ambulatory Visit (INDEPENDENT_AMBULATORY_CARE_PROVIDER_SITE_OTHER): Payer: Medicare HMO

## 2021-06-22 DIAGNOSIS — Z0000 Encounter for general adult medical examination without abnormal findings: Secondary | ICD-10-CM | POA: Diagnosis not present

## 2021-06-22 NOTE — Patient Instructions (Addendum)
Natalie Gibbs , Thank you for taking time to come for your Medicare Wellness Visit. I appreciate your ongoing commitment to your health goals. Please review the following plan we discussed and let me know if I can assist you in the future.   Screening recommendations/referrals: Colonoscopy: done 10/11/17. Repeat 10/2022 Mammogram: done 06/09/21 Bone Density: done 06/09/21 Recommended yearly ophthalmology/optometry visit for glaucoma screening and checkup Recommended yearly dental visit for hygiene and checkup  Vaccinations: Influenza vaccine: done 02/06/21 Pneumococcal vaccine: done 03/06/14 Tdap vaccine: done 03/10/15 Shingles vaccine: Shingrix discussed. Please contact your pharmacy for coverage information.  Covid-19:done 01/31/19 & 02/16/19  Advanced directives: Please bring a copy of your health care power of attorney and living will to the office at your convenience.   Conditions/risks identified: recommend increasing physical activity   Next appointment: Follow up in one year for your annual wellness visit    Preventive Care 65 Years and Older, Female Preventive care refers to lifestyle choices and visits with your health care provider that can promote health and wellness. What does preventive care include? A yearly physical exam. This is also called an annual well check. Dental exams once or twice a year. Routine eye exams. Ask your health care provider how often you should have your eyes checked. Personal lifestyle choices, including: Daily care of your teeth and gums. Regular physical activity. Eating a healthy diet. Avoiding tobacco and drug use. Limiting alcohol use. Practicing safe sex. Taking low-dose aspirin every day. Taking vitamin and mineral supplements as recommended by your health care provider. What happens during an annual well check? The services and screenings done by your health care provider during your annual well check will depend on your age, overall health,  lifestyle risk factors, and family history of disease. Counseling  Your health care provider may ask you questions about your: Alcohol use. Tobacco use. Drug use. Emotional well-being. Home and relationship well-being. Sexual activity. Eating habits. History of falls. Memory and ability to understand (cognition). Work and work Statistician. Reproductive health. Screening  You may have the following tests or measurements: Height, weight, and BMI. Blood pressure. Lipid and cholesterol levels. These may be checked every 5 years, or more frequently if you are over 81 years old. Skin check. Lung cancer screening. You may have this screening every year starting at age 66 if you have a 30-pack-year history of smoking and currently smoke or have quit within the past 15 years. Fecal occult blood test (FOBT) of the stool. You may have this test every year starting at age 72. Flexible sigmoidoscopy or colonoscopy. You may have a sigmoidoscopy every 5 years or a colonoscopy every 10 years starting at age 62. Hepatitis C blood test. Hepatitis B blood test. Sexually transmitted disease (STD) testing. Diabetes screening. This is done by checking your blood sugar (glucose) after you have not eaten for a while (fasting). You may have this done every 1-3 years. Bone density scan. This is done to screen for osteoporosis. You may have this done starting at age 80. Mammogram. This may be done every 1-2 years. Talk to your health care provider about how often you should have regular mammograms. Talk with your health care provider about your test results, treatment options, and if necessary, the need for more tests. Vaccines  Your health care provider may recommend certain vaccines, such as: Influenza vaccine. This is recommended every year. Tetanus, diphtheria, and acellular pertussis (Tdap, Td) vaccine. You may need a Td booster every 10 years. Zoster vaccine.  You may need this after age  5. Pneumococcal 13-valent conjugate (PCV13) vaccine. One dose is recommended after age 38. Pneumococcal polysaccharide (PPSV23) vaccine. One dose is recommended after age 93. Talk to your health care provider about which screenings and vaccines you need and how often you need them. This information is not intended to replace advice given to you by your health care provider. Make sure you discuss any questions you have with your health care provider. Document Released: 01/17/2015 Document Revised: 09/10/2015 Document Reviewed: 10/22/2014 Elsevier Interactive Patient Education  2017 [employer] [employer] Prevention in the Home Falls can cause injuries. They can happen to people of all ages. There are many things you can do to make your home safe and to help prevent falls. What can I do on the outside of my home? Regularly fix the edges of walkways and driveways and fix any cracks. Remove anything that might make you trip as you walk through a door, such as a raised step or threshold. Trim any bushes or trees on the path to your home. Use bright outdoor lighting. Clear any walking paths of anything that might make someone trip, such as rocks or tools. Regularly check to see if handrails are loose or broken. Make sure that both sides of any steps have handrails. Any raised decks and porches should have guardrails on the edges. Have any leaves, snow, or ice cleared regularly. Use sand or salt on walking paths during winter. Clean up any spills in your garage right away. This includes oil or grease spills. What can I do in the bathroom? Use night lights. Install grab bars by the toilet and in the tub and shower. Do not use towel bars as grab bars. Use non-skid mats or decals in the tub or shower. If you need to sit down in the shower, use a plastic, non-slip stool. Keep the floor dry. Clean up any water that spills on the floor as soon as it happens. Remove soap buildup in the tub or shower  regularly. Attach bath mats securely with double-sided non-slip rug tape. Do not have throw rugs and other things on the floor that can make you trip. What can I do in the bedroom? Use night lights. Make sure that you have a light by your bed that is easy to reach. Do not use any sheets or blankets that are too big for your bed. They should not hang down onto the floor. Have a firm chair that has side arms. You can use this for support while you get dressed. Do not have throw rugs and other things on the floor that can make you trip. What can I do in the kitchen? Clean up any spills right away. Avoid walking on wet floors. Keep items that you use a lot in easy-to-reach places. If you need to reach something above you, use a strong step stool that has a grab bar. Keep electrical cords out of the way. Do not use floor polish or wax that makes floors slippery. If you must use wax, use non-skid floor wax. Do not have throw rugs and other things on the floor that can make you trip. What can I do with my stairs? Do not leave any items on the stairs. Make sure that there are handrails on both sides of the stairs and use them. Fix handrails that are broken or loose. Make sure that handrails are as long as the stairways. Check any carpeting to make sure that it is  firmly attached to the stairs. Fix any carpet that is loose or worn. Avoid having throw rugs at the top or bottom of the stairs. If you do have throw rugs, attach them to the floor with carpet tape. Make sure that you have a light switch at the top of the stairs and the bottom of the stairs. If you do not have them, ask someone to add them for you. What else can I do to help prevent falls? Wear shoes that: Do not have high heels. Have rubber bottoms. Are comfortable and fit you well. Are closed at the toe. Do not wear sandals. If you use a stepladder: Make sure that it is fully opened. Do not climb a closed stepladder. Make sure that  both sides of the stepladder are locked into place. Ask someone to hold it for you, if possible. Clearly mark and make sure that you can see: Any grab bars or handrails. First and last steps. Where the edge of each step is. Use tools that help you move around (mobility aids) if they are needed. These include: Canes. Walkers. Scooters. Crutches. Turn on the lights when you go into a dark area. Replace any light bulbs as soon as they burn out. Set up your furniture so you have a clear path. Avoid moving your furniture around. If any of your floors are uneven, fix them. If there are any pets around you, be aware of where they are. Review your medicines with your doctor. Some medicines can make you feel dizzy. This can increase your chance of falling. Ask your doctor what other things that you can do to help prevent falls. This information is not intended to replace advice given to you by your health care provider. Make sure you discuss any questions you have with your health care provider. Document Released: 10/17/2008 Document Revised: 05/29/2015 Document Reviewed: 01/25/2014 Elsevier Interactive Patient Education  2017 [employer].

## 2021-06-22 NOTE — Progress Notes (Signed)
Subjective:   Natalie Gibbs is a 81 y.o. female who presents for Medicare Annual (Subsequent) preventive examination.  Virtual Visit via Telephone Note  I connected with  Natalie Gibbs on 06/22/21 at  8:15 AM EDT by telephone and verified that I am speaking with the correct person using two identifiers.  Location: Patient: home Provider: [facility] Persons participating in the virtual visit: Natalie Gibbs   I discussed the limitations, risks, security and privacy concerns of performing an evaluation and management service by telephone and the availability of in person appointments. The patient expressed understanding and agreed to proceed.  Interactive audio and video telecommunications were attempted between this nurse and patient, however failed, due to patient having technical difficulties OR patient did not have access to video capability.  We continued and completed visit with audio only.  Some vital signs may be absent or patient reported.   Natalie Marker, LPN   Review of Systems     Cardiac Risk Factors include: advanced age (>21mn, >>40women);hypertension     Objective:    There were no vitals filed for this visit. There is no height or weight on file to calculate BMI.     06/22/2021    8:18 AM 04/23/2020    9:36 AM 04/23/2019    9:31 AM 03/29/2018   11:25 AM 10/11/2017   10:50 AM 03/23/2017    8:25 AM 10/26/2016    8:57 AM  Advanced Directives  Does Patient Have a Medical Advance Directive? Yes Yes No Yes Yes Yes Yes  Type of [place] will [place] will  Living will;Healthcare Power of Attorney Living [place] Does patient want to make changes to medical advance directive?       No - Patient declined  Copy of [place] Chart? No - copy requested No - copy requested  No - copy  requested  No - copy requested No - copy requested  Would patient like information on creating a medical advance directive?   Yes (MAU/Ambulatory/Procedural Areas - Information given)        Current Medications (verified) Outpatient Encounter Medications as of 06/22/2021  Medication Sig   Accu-Chek Softclix Lancets lancets 1 each by Other route daily. Use to test blood sugar once daily.   Alcohol Swabs (B-D SINGLE USE SWABS REGULAR) PADS USE AS DIRECTED   aspirin 81 MG tablet Take 81 mg by mouth daily.   blood glucose meter kit and supplies Dispense based on patient and insurance preference. Use up to four times daily as directed. (FOR ICD-10 - E11.9).   brimonidine (ALPHAGAN) 0.2 % ophthalmic solution Place 1 drop into both eyes 2 (two) times daily.    calcium-vitamin D (OSCAL WITH D) 500-200 MG-UNIT tablet Take 1 tablet by mouth.   dorzolamide-timolol (COSOPT) 22.3-6.8 MG/ML ophthalmic solution Place 1 drop into both eyes 2 (two) times daily.   glucose blood (ACCU-CHEK AVIVA) test strip Test blood sugar once daily   losartan (COZAAR) 25 MG tablet Take 1 tablet (25 mg total) by mouth daily.   meloxicam (MOBIC) 15 MG tablet Mobic 15 mg tablet  Take 1 tablet(s) every day by oral route.   No facility-administered encounter medications on file as of 06/22/2021.    Allergies (verified) Iodine, Shellfish allergy, and Codeine   History: Past Medical History:  Diagnosis Date   Allergy  Arthritis    Diffuse cystic mastopathy    Glaucoma    Hypertension    Obesity, unspecified    Pre-diabetes    Special screening for malignant neoplasms, colon    Past Surgical History:  Procedure Laterality Date   BREAST BIOPSY Left 20 + yrs ago   benign   BREAST CYST ASPIRATION Left 2015   neg   BREAST DUCTAL SYSTEM EXCISION Left 10/26/2016   Intraductal papilloma Surgeon: Christene Lye, MD;  Location: [facility] ORS;  Service: General;  Laterality: Left;   CATARACT EXTRACTION, BILATERAL  Bilateral 2017   COLONOSCOPY  2010   Dr. Candace Cruise, [facility]   COLONOSCOPY N/A 05/13/2014   Procedure: COLONOSCOPY;  Surgeon: Hulen Luster, MD;  Location: [facility] ENDOSCOPY;  Service: Gastroenterology;  Laterality: N/A;   COLONOSCOPY  04/28/2016   Dr. Jamal Collin.   COLONOSCOPY WITH PROPOFOL N/A 10/11/2017   Procedure: COLONOSCOPY WITH PROPOFOL;  Surgeon: Toledo, Benay Pike, MD;  Location: [facility] ENDOSCOPY;  Service: Gastroenterology;  Laterality: N/A;   excision mass abdominal wall  1997   EYE SURGERY  2017   bilateral   TUBAL LIGATION     Family History  Problem Relation Age of Onset   Healthy Mother    Emphysema Father    Breast cancer Other        niece/breast   Diabetes Sister    Diabetes Sister    Breast cancer Other        niece   Kidney failure Brother    Multiple sclerosis Daughter    Social History   Socioeconomic History   Marital status: Married    Spouse name: Not on file   Number of children: 4   Years of education: Not on file   Highest education level: 10th grade  Occupational History   Occupation: Retired    Fish farm manager: OTHER  Tobacco Use   Smoking status: Never   Smokeless tobacco: Never   Tobacco comments:    smoking cessation materials not required  Vaping Use   Vaping Use: Never used  Substance and Sexual Activity   Alcohol use: No    Alcohol/week: 0.0 standard drinks of alcohol   Drug use: No   Sexual activity: Never  Other Topics Concern   Not on file  Social History Narrative   Not on file   Social Determinants of Health   Financial Resource Strain: Low Risk  (06/22/2021)   Overall Financial Resource Strain (CARDIA)    Difficulty of Paying Living Expenses: Not hard at all  Food Insecurity: No Food Insecurity (06/22/2021)   Hunger Vital Sign    Worried About Running Out of Food in the Last Year: Never true    [place] in the Last Year: Never true  Transportation Needs: No Transportation Needs (06/22/2021)   PRAPARE - Radiographer, therapeutic (Medical): No    Lack of Transportation (Non-Medical): No  Physical Activity: Inactive (06/22/2021)   Exercise Vital Sign    Days of Exercise per Week: 0 days    Minutes of Exercise per Session: 0 min  Stress: No Stress Concern Present (06/22/2021)   [place]    Feeling of Stress : Not at all  Social Connections: Moderately Integrated (06/22/2021)   Social Connection and Isolation Panel [NHANES]    Frequency of Communication with Friends and Family: More than three times a week    Frequency of Social Gatherings with Friends and  Family: More than three times a week    Attends Religious Services: More than 4 times per year    Active Member of Clubs or Organizations: No    Attends Archivist Meetings: Never    Marital Status: Married    Tobacco Counseling Counseling given: Not Answered Tobacco comments: smoking cessation materials not required   Clinical Intake:  Pre-visit preparation completed: Yes  Pain : No/denies pain     Nutritional Risks: None Diabetes: No  How often do you need to have someone help you when you read instructions, pamphlets, or other written materials from your doctor or pharmacy?: 1 - Never    Interpreter Needed?: No  Information entered by :: Natalie Marker LPN   Activities of Daily Living    06/22/2021    8:19 AM 04/13/2021    8:42 AM  In your present state of health, do you have any difficulty performing the following activities:  Hearing? 0 0  Vision? 0 0  Difficulty concentrating or making decisions? 0 0  Walking or climbing stairs? 0 0  Dressing or bathing? 0 0  Doing errands, shopping? 0 0  Preparing Food and eating ? N   Using the Toilet? N   In the past six months, have you accidently leaked urine? Y   Comment wears liners for protection   Do you have problems with loss of bowel control? N   Managing your Medications? N   Managing your  Finances? N   Housekeeping or managing your Housekeeping? N     Patient Care Team: Glean Hess, MD as PCP - General (Internal Medicine) Lorelee Cover., MD (Ophthalmology)  Indicate any recent Medical Services you may have received from other than [facility] providers in the past year (date may be approximate).     Assessment:   This is a routine wellness examination for Ticia Virgo.  Hearing/Vision screen Hearing Screening - Comments:: Pt wears hearing aids  Vision Screening - Comments:: Annual vision screenings with Dr. Gloriann Loan  Dietary issues and exercise activities discussed: Current Exercise Habits: The patient does not participate in regular exercise at present, Exercise limited by: None identified   Goals Addressed             This Visit's Progress    DIET - INCREASE WATER INTAKE   On track    Recommend to drink at least 6-8 8oz glasses of water per day.       Depression Screen    06/22/2021    8:17 AM 04/13/2021    8:42 AM 02/06/2021    3:55 PM 02/02/2021   10:44 AM 10/08/2020   10:00 AM 04/23/2020    9:35 AM 04/08/2020    9:02 AM  PHQ 2/9 Scores  PHQ - 2 Score 0 0 0 0 0 0 0  PHQ- 9 Score  0 0 0 0  0    Fall Risk    06/22/2021    8:19 AM 04/13/2021    8:42 AM 02/06/2021    3:55 PM 02/02/2021   10:44 AM 10/08/2020   10:01 AM  Fall Risk   Falls in the past year? 0 0 0 0 0  Number falls in past yr: 0 0 0 0 0  Injury with Fall? 0 0 0 0 0  Risk for fall due to : No Fall Risks No Fall Risks No Fall Risks No Fall Risks No Fall Risks  Follow up Falls prevention discussed Falls evaluation completed Falls evaluation completed  Falls evaluation completed Falls evaluation completed    FALL RISK PREVENTION PERTAINING TO THE HOME:  Any stairs in or around the home? Yes  If so, are there any without handrails? No  Home free of loose throw rugs in walkways, pet beds, electrical cords, etc? Yes  Adequate lighting in your home to reduce risk of falls? Yes   ASSISTIVE DEVICES  UTILIZED TO PREVENT FALLS:  Life alert? No  Use of a cane, walker or w/c? No  Grab bars in the bathroom? No Shower chair or bench in shower? Yes  Elevated toilet seat or a handicapped toilet? No   TIMED UP AND GO:  Was the test performed? No . Telephonic visit  Cognitive Function: Normal cognitive status assessed by direct observation by this Nurse Health Advisor. No abnormalities found.          04/23/2019    9:34 AM 03/29/2018   11:29 AM 03/23/2017    8:30 AM 03/31/2016   10:06 AM  6CIT Screen  What Year? 0 points 0 points 0 points 0 points  What month? 0 points 0 points 0 points 0 points  What time? 0 points 0 points 3 points 0 points  Count back from 20 0 points 0 points 0 points 0 points  Months in reverse 4 points 4 points 0 points 0 points  Repeat phrase 6 points 2 points 8 points 2 points  Total Score 10 points 6 points 11 points 2 points    Immunizations Immunization History  Administered Date(s) Administered   Fluad Quad(high Dose 65+) 10/22/2019, 10/08/2020, 02/06/2021   Influenza,inj,Quad PF,6+ Mos 09/10/2015, 03/23/2017   PFIZER(Purple Top)SARS-COV-2 Vaccination 01/31/2019, 02/16/2019   Pneumococcal Conjugate-13 03/06/2014   Pneumococcal Polysaccharide-23 03/06/2008   Tdap 03/10/2015    TDAP status: Up to date  Flu Vaccine status: Up to date  Pneumococcal vaccine status: Up to date  Covid-19 vaccine status: Completed vaccines  Qualifies for Shingles Vaccine? Yes   Zostavax completed No   Shingrix Completed?: No.    Education has been provided regarding the importance of this vaccine. Patient has been advised to call insurance company to determine out of pocket expense if they have not yet received this vaccine. Advised may also receive vaccine at local pharmacy or Health Dept. Verbalized acceptance and understanding.  Screening Tests Health Maintenance  Topic Date Due   COVID-19 Vaccine (3 - Pfizer risk series) 03/16/2019   Zoster Vaccines- Shingrix  (1 of 2) 07/13/2021 (Originally 03/06/1959)   INFLUENZA VACCINE  08/04/2021   MAMMOGRAM  06/10/2022   COLONOSCOPY (Pts 45-51yr Insurance coverage will need to be confirmed)  10/12/2022   TETANUS/TDAP  03/09/2025   Pneumonia Vaccine 81 Years old  Completed   DEXA SCAN  Completed   Hepatitis C Screening  Completed   HPV VACCINES  Aged Out    Health Maintenance  Health Maintenance Due  Topic Date Due   COVID-19 Vaccine (3 - PRedbirdrisk series) 03/16/2019    Colorectal cancer screening: Type of screening: Colonoscopy. Completed 10/11/17. Repeat every 5 years  Mammogram status: Completed 06/09/21. Repeat every year  Bone Density status: Completed 06/09/21. Results reflect: Bone density results: OSTEOPENIA. Repeat every 2 years.  Lung Cancer Screening: (Low Dose CT Chest recommended if Age 242-80years, 30 pack-year currently smoking OR have quit w/in 15years.) does not qualify.   Additional Screening:  Hepatitis C Screening: does qualify; Completed 10/22/19  Vision Screening: Recommended annual ophthalmology exams for early detection of glaucoma and other disorders of  the eye. Is the patient up to date with their annual eye exam?  Yes  Who is the provider or what is the name of the office in which the patient attends annual eye exams? Dr. Gloriann Loan  Dental Screening: Recommended annual dental exams for proper oral hygiene  Community Resource Referral / Chronic Care Management: CRR required this visit?  No   CCM required this visit?  No      Plan:     I have personally reviewed and noted the following in the patient's chart:   Medical and social history Use of alcohol, tobacco or illicit drugs  Current medications and supplements including opioid prescriptions.  Functional ability and status Nutritional status Physical activity Advanced directives List of other physicians Hospitalizations, surgeries, and ER visits in previous 12 months Vitals Screenings to include cognitive,  depression, and falls Referrals and appointments  In addition, I have reviewed and discussed with patient certain preventive protocols, quality metrics, and best practice recommendations. A written personalized care plan for preventive services as well as general preventive health recommendations were provided to patient.     Natalie Marker, LPN   [id number]   Nurse Notes: none

## 2021-08-20 DIAGNOSIS — M170 Bilateral primary osteoarthritis of knee: Secondary | ICD-10-CM | POA: Insufficient documentation

## 2021-08-31 ENCOUNTER — Other Ambulatory Visit: Payer: Self-pay | Admitting: Internal Medicine

## 2021-08-31 DIAGNOSIS — R809 Proteinuria, unspecified: Secondary | ICD-10-CM

## 2021-09-01 NOTE — Telephone Encounter (Signed)
Requested Prescriptions  Pending Prescriptions Disp Refills  . losartan (COZAAR) 25 MG tablet [Pharmacy Med Name: LOSARTAN POTASSIUM 25 MG Tablet] 90 tablet 1    Sig: TAKE 1 TABLET (25 MG TOTAL) BY MOUTH DAILY.     Cardiovascular:  Angiotensin Receptor Blockers Passed - 08/31/2021 10:48 AM      Passed - Cr in normal range and within 180 days    Creatinine  Date Value Ref Range Status  05/19/2012 0.70 0.60 - 1.30 mg/dL Final   Creatinine, Ser  Date Value Ref Range Status  04/13/2021 0.73 0.57 - 1.00 mg/dL Final         Passed - K in normal range and within 180 days    Potassium  Date Value Ref Range Status  04/13/2021 4.5 3.5 - 5.2 mmol/L Final  05/19/2012 4.0 3.5 - 5.1 mmol/L Final         Passed - Patient is not pregnant      Passed - Last BP in normal range    BP Readings from Last 1 Encounters:  04/13/21 102/68         Passed - Valid encounter within last 6 months    Recent Outpatient Visits          4 months ago Annual physical exam   [place] [facility], Jesse Sans, MD   6 months ago UTI symptoms   [place] [facility] at [facility], Jesse Sans, MD   7 months ago Acute cystitis without hematuria   [place] [facility] at [facility], Jesse Sans, MD   10 months ago Prediabetes   [place] [facility] at [facility], Jesse Sans, MD   1 year ago Annual physical exam   [facility] at [facility], Jesse Sans, MD      Future Appointments            In 1 month Army Melia, Jesse Sans, MD [place] [facility] at [facility], [facility]

## 2021-10-13 ENCOUNTER — Ambulatory Visit: Payer: Medicare HMO | Admitting: Internal Medicine

## 2021-10-19 ENCOUNTER — Other Ambulatory Visit: Payer: Self-pay

## 2021-10-19 MED ORDER — BLOOD GLUCOSE METER KIT: PACK | 0 refills | Status: AC

## 2021-10-20 ENCOUNTER — Ambulatory Visit: Payer: Medicare HMO | Admitting: Internal Medicine

## 2021-10-20 ENCOUNTER — Other Ambulatory Visit: Payer: Self-pay

## 2021-10-27 ENCOUNTER — Encounter: Payer: Self-pay | Admitting: Internal Medicine

## 2021-10-27 ENCOUNTER — Ambulatory Visit (INDEPENDENT_AMBULATORY_CARE_PROVIDER_SITE_OTHER): Payer: Medicare HMO | Admitting: Internal Medicine

## 2021-10-27 VITALS — BP 120/78 | HR 61 | Ht 64.0 in | Wt 173.0 lb

## 2021-10-27 DIAGNOSIS — Z23 Encounter for immunization: Secondary | ICD-10-CM | POA: Diagnosis not present

## 2021-10-27 DIAGNOSIS — R7303 Prediabetes: Secondary | ICD-10-CM | POA: Diagnosis not present

## 2021-10-27 DIAGNOSIS — I10 Essential (primary) hypertension: Secondary | ICD-10-CM | POA: Diagnosis not present

## 2021-10-27 LAB — POCT GLYCOSYLATED HEMOGLOBIN (HGB A1C): Hemoglobin A1C: 5.1 % (ref 4.0–5.6)

## 2021-10-27 NOTE — Progress Notes (Signed)
Date:  10/27/2021   Name:  Natalie Gibbs   DOB:  1940-03-02   MRN:  [id number]   Chief Complaint: Prediabetes  Diabetes She presents for her follow-up diabetic visit. Diabetes type: prediabetes. Her disease course has been stable. Pertinent negatives for hypoglycemia include no headaches or tremors. Pertinent negatives for diabetes include no chest pain, no fatigue, no polydipsia and no polyuria. Symptoms are stable. Pertinent negatives for diabetic complications include no CVA, heart disease or nephropathy.  Hypertension This is a new problem. The problem is controlled. Pertinent negatives include no chest pain, headaches, palpitations or shortness of breath. Past treatments include angiotensin blockers. The current treatment provides significant improvement. Hypertensive end-organ damage includes kidney disease (microalbuminuria). There is no history of CVA.    Lab Results  Component Value Date   NA 139 04/13/2021   K 4.5 04/13/2021   CO2 25 04/13/2021   GLUCOSE 119 (H) 04/13/2021   BUN 13 04/13/2021   CREATININE 0.73 04/13/2021   CALCIUM 9.3 04/13/2021   EGFR 84 04/13/2021   GFRNONAA 72 04/05/2019   Lab Results  Component Value Date   CHOL 141 04/13/2021   HDL 37 (L) 04/13/2021   LDLCALC 89 04/13/2021   TRIG 74 04/13/2021   CHOLHDL 3.8 04/13/2021   Lab Results  Component Value Date   TSH 1.640 04/13/2021   Lab Results  Component Value Date   HGBA1C 5.9 (H) 04/13/2021   Lab Results  Component Value Date   WBC 4.5 04/13/2021   HGB 12.0 04/13/2021   HCT 37.7 04/13/2021   MCV 90 04/13/2021   PLT 249 04/13/2021   Lab Results  Component Value Date   ALT 18 04/13/2021   AST 19 04/13/2021   ALKPHOS 91 04/13/2021   BILITOT 0.3 04/13/2021   Lab Results  Component Value Date   VD25OH 32.5 04/13/2021     Review of Systems  Constitutional:  Negative for appetite change, fatigue, fever and unexpected weight change.  HENT:  Negative for tinnitus and  trouble swallowing.   Eyes:  Negative for visual disturbance.  Respiratory:  Negative for cough, chest tightness and shortness of breath.   Cardiovascular:  Negative for chest pain, palpitations and leg swelling.  Gastrointestinal:  Negative for abdominal pain.  Endocrine: Negative for polydipsia and polyuria.  Genitourinary:  Negative for dysuria and hematuria.  Musculoskeletal:  Negative for arthralgias.  Neurological:  Negative for tremors, numbness and headaches.  Psychiatric/Behavioral:  Negative for dysphoric mood.     Patient Active Problem List   Diagnosis Date Noted   Essential hypertension 10/27/2021   Osteoarthritis of knees, bilateral 08/20/2021   Dyslipidemia 06/02/2021   Urine test positive for microalbuminuria 10/08/2020   Osteopenia determined by x-ray 04/05/2019   Abnormal mammogram of right breast 04/19/2017   Intraductal papilloma of breast 10/27/2016   Endometrial hyperplasia 06/14/2016   Prediabetes 05/15/2014   Allergic state 05/15/2014   Fibrocystic breast 05/15/2014   Glaucoma 05/15/2014    Allergies  Allergen Reactions   Iodine Itching   Shellfish Allergy Itching   Codeine Rash    Past Surgical History:  Procedure Laterality Date   BREAST BIOPSY Left 20 + yrs ago   benign   BREAST CYST ASPIRATION Left 2015   neg   BREAST DUCTAL SYSTEM EXCISION Left 10/26/2016   Intraductal papilloma Surgeon: Christene Lye, MD;  Location: [facility] ORS;  Service: General;  Laterality: Left;   CATARACT EXTRACTION, BILATERAL Bilateral 2017   COLONOSCOPY  2010   [facility]. Candace Cruise, [facility]   COLONOSCOPY N/A 05/13/2014   Procedure: COLONOSCOPY;  Surgeon: Hulen Luster, MD;  Location: [facility] ENDOSCOPY;  Service: Gastroenterology;  Laterality: N/A;   COLONOSCOPY  04/28/2016   Dr. Jamal Collin.   COLONOSCOPY WITH PROPOFOL N/A 10/11/2017   Procedure: COLONOSCOPY WITH PROPOFOL;  Surgeon: Toledo, Benay Pike, MD;  Location: [facility] ENDOSCOPY;  Service: Gastroenterology;  Laterality: N/A;   excision  mass abdominal wall  1997   EYE SURGERY  2017   bilateral   TUBAL LIGATION      Social History   Tobacco Use   Smoking status: Never   Smokeless tobacco: Never   Tobacco comments:    smoking cessation materials not required  Vaping Use   Vaping Use: Never used  Substance Use Topics   Alcohol use: No    Alcohol/week: 0.0 standard drinks of alcohol   Drug use: No     Medication list has been reviewed and updated.  Current Meds  Medication Sig   Alcohol Swabs (B-D SINGLE USE SWABS REGULAR) PADS USE AS DIRECTED   aspirin 81 MG tablet Take 81 mg by mouth daily.   blood glucose meter kit and supplies Dispense based on patient and insurance preference. Use up to four times daily as directed. (FOR ICD-10 E10.9, E11.9).   brimonidine (ALPHAGAN) 0.2 % ophthalmic solution Place 1 drop into both eyes 2 (two) times daily.    calcium-vitamin D (OSCAL WITH D) 500-200 MG-UNIT tablet Take 1 tablet by mouth.   dorzolamide-timolol (COSOPT) 22.3-6.8 MG/ML ophthalmic solution Place 1 drop into both eyes 2 (two) times daily.   glucose blood (ACCU-CHEK AVIVA) test strip Test blood sugar once daily   losartan (COZAAR) 25 MG tablet TAKE 1 TABLET (25 MG TOTAL) BY MOUTH DAILY.   meloxicam (MOBIC) 15 MG tablet Mobic 15 mg tablet  Take 1 tablet(s) every day by oral route.       04/13/2021    8:42 AM 02/06/2021    3:55 PM 02/02/2021   10:44 AM 10/08/2020   10:00 AM  GAD 7 : Generalized Anxiety Score  Nervous, Anxious, on Edge 0 0 0 0  Control/stop worrying 0 0 0 0  Worry too much - different things 0 0 0 0  Trouble relaxing 0 0 0 0  Restless 0 0 0 0  Easily annoyed or irritable 0 0 0 0  Afraid - awful might happen 0 0 0 0  Total GAD 7 Score 0 0 0 0  Anxiety Difficulty Not difficult at all          06/22/2021    8:17 AM 04/13/2021    8:42 AM 02/06/2021    3:55 PM  Depression screen PHQ 2/9  Decreased Interest 0 0 0  Down, Depressed, Hopeless 0 0 0  PHQ - 2 Score 0 0 0  Altered sleeping  0 0   Tired, decreased energy  0 0  Change in appetite  0 0  Feeling bad or failure about yourself   0 0  Trouble concentrating  0 0  Moving slowly or fidgety/restless  0 0  Suicidal thoughts  0 0  PHQ-9 Score  0 0  Difficult doing work/chores  Not difficult at all Not difficult at all    BP Readings from Last 3 Encounters:  10/27/21 120/78  04/13/21 102/68  02/06/21 136/76    Physical Exam Vitals and nursing note reviewed.  Constitutional:      General: She is not in  acute distress.    Appearance: She is well-developed.  HENT:     Head: Normocephalic and atraumatic.  Pulmonary:     Effort: Pulmonary effort is normal. No respiratory distress.  Skin:    General: Skin is warm and dry.     Findings: No rash.  Neurological:     Mental Status: She is alert and oriented to person, place, and time.  Psychiatric:        Mood and Affect: Mood normal.        Behavior: Behavior normal.     Wt Readings from Last 3 Encounters:  10/27/21 173 lb (78.5 kg)  04/13/21 181 lb 9.6 oz (82.4 kg)  02/06/21 176 lb (79.8 kg)    BP 120/78 (BP Location: Right Arm, Patient Position: Sitting, Cuff Size: Normal)   Pulse 61   Ht _0  (1.626 m)   Wt 173 lb (78.5 kg)   SpO2 98%   BMI 29.70 kg/m   Assessment and Plan: 1. Essential hypertension Clinically stable exam with well controlled BP. Tolerating medications without side effects at this time. Pt to continue current regimen and low sodium diet; benefits of regular exercise as able discussed.  2. Prediabetes Doing well with diet and some weight loss A1C now normal range. - POCT glycosylated hemoglobin (Hb A1C) = 5.1   Partially dictated using Editor, commissioning. Any errors are unintentional.  Halina Maidens, MD [facility]  10/27/2021

## 2022-01-22 ENCOUNTER — Inpatient Hospital Stay: Admit: 2022-01-22 | Payer: Medicare HMO

## 2022-02-03 ENCOUNTER — Encounter: Payer: Self-pay | Admitting: Internal Medicine

## 2022-02-03 ENCOUNTER — Ambulatory Visit (INDEPENDENT_AMBULATORY_CARE_PROVIDER_SITE_OTHER): Payer: Medicare HMO | Admitting: Internal Medicine

## 2022-02-03 ENCOUNTER — Telehealth: Payer: Self-pay | Admitting: Internal Medicine

## 2022-02-03 VITALS — BP 122/60 | HR 75 | Ht 64.0 in | Wt 170.0 lb

## 2022-02-03 DIAGNOSIS — R399 Unspecified symptoms and signs involving the genitourinary system: Secondary | ICD-10-CM | POA: Diagnosis not present

## 2022-02-03 MED ORDER — SULFAMETHOXAZOLE-TRIMETHOPRIM 800-160 MG PO TABS: 1.0000 | ORAL_TABLET | Freq: Two times a day (BID) | ORAL | 0 refills | Status: AC

## 2022-02-03 NOTE — Telephone Encounter (Signed)
Pt is calling because she is having discomfort with urination. Pt asked to come on Monday 02/08/22 because there is no one to stay with her daughter that has MS. Dr. Army Melia has no appts on appt. Attempted to call the office, with no answer - requesting to be scheduled with a different provider or to be worked in 02/08/22.   Please advise with the pt. CB- [phone]

## 2022-02-03 NOTE — Telephone Encounter (Signed)
Please schedule appt. Same day is ok.  KP

## 2022-02-03 NOTE — Progress Notes (Signed)
Date:  02/03/2022   Name:  Natalie Gibbs   DOB:  February 04, 1940   MRN:  [id number]   Chief Complaint: Urinary Tract Infection (Urinary frequency and discomfort after urinating.)  Urinary Tract Infection  This is a new problem. The problem has been unchanged. The quality of the pain is described as burning. The pain is mild. There has been no fever. Associated symptoms include frequency and urgency. Pertinent negatives include no chills, hematuria, nausea or vomiting.    Lab Results  Component Value Date   NA 139 04/13/2021   K 4.5 04/13/2021   CO2 25 04/13/2021   GLUCOSE 119 (H) 04/13/2021   BUN 13 04/13/2021   CREATININE 0.73 04/13/2021   CALCIUM 9.3 04/13/2021   EGFR 84 04/13/2021   GFRNONAA 72 04/05/2019   Lab Results  Component Value Date   CHOL 141 04/13/2021   HDL 37 (L) 04/13/2021   LDLCALC 89 04/13/2021   TRIG 74 04/13/2021   CHOLHDL 3.8 04/13/2021   Lab Results  Component Value Date   TSH 1.640 04/13/2021   Lab Results  Component Value Date   HGBA1C 5.1 10/27/2021   Lab Results  Component Value Date   WBC 4.5 04/13/2021   HGB 12.0 04/13/2021   HCT 37.7 04/13/2021   MCV 90 04/13/2021   PLT 249 04/13/2021   Lab Results  Component Value Date   ALT 18 04/13/2021   AST 19 04/13/2021   ALKPHOS 91 04/13/2021   BILITOT 0.3 04/13/2021   Lab Results  Component Value Date   VD25OH 32.5 04/13/2021     Review of Systems  Constitutional:  Negative for chills, fatigue and fever.  Respiratory:  Negative for chest tightness and shortness of breath.   Cardiovascular:  Negative for chest pain and leg swelling.  Gastrointestinal:  Negative for diarrhea, nausea and vomiting.  Genitourinary:  Positive for dysuria, frequency and urgency. Negative for hematuria.  Neurological:  Negative for headaches.    Patient Active Problem List   Diagnosis Date Noted   Essential hypertension 10/27/2021   Osteoarthritis of knees, bilateral 08/20/2021   Dyslipidemia  06/02/2021   Urine test positive for microalbuminuria 10/08/2020   Osteopenia determined by x-ray 04/05/2019   Abnormal mammogram of right breast 04/19/2017   Intraductal papilloma of breast 10/27/2016   Endometrial hyperplasia 06/14/2016   Prediabetes 05/15/2014   Allergic state 05/15/2014   Fibrocystic breast 05/15/2014   Glaucoma 05/15/2014    Allergies  Allergen Reactions   Iodine Itching   Shellfish Allergy Itching   Codeine Rash    Past Surgical History:  Procedure Laterality Date   BREAST BIOPSY Left 20 + yrs ago   benign   BREAST CYST ASPIRATION Left 2015   neg   BREAST DUCTAL SYSTEM EXCISION Left 10/26/2016   Intraductal papilloma Surgeon: Christene Lye, MD;  Location: [facility] ORS;  Service: General;  Laterality: Left;   CATARACT EXTRACTION, BILATERAL Bilateral 2017   COLONOSCOPY  2010   Dr. Candace Cruise, [facility]   COLONOSCOPY N/A 05/13/2014   Procedure: COLONOSCOPY;  Surgeon: Hulen Luster, MD;  Location: [facility] ENDOSCOPY;  Service: Gastroenterology;  Laterality: N/A;   COLONOSCOPY  04/28/2016   Dr. Jamal Collin.   COLONOSCOPY WITH PROPOFOL N/A 10/11/2017   Procedure: COLONOSCOPY WITH PROPOFOL;  Surgeon: Toledo, Benay Pike, MD;  Location: [facility] ENDOSCOPY;  Service: Gastroenterology;  Laterality: N/A;   excision mass abdominal wall  1997   EYE SURGERY  2017   bilateral   TUBAL LIGATION  Social History   Tobacco Use   Smoking status: Never   Smokeless tobacco: Never   Tobacco comments:    smoking cessation materials not required  Vaping Use   Vaping Use: Never used  Substance Use Topics   Alcohol use: No    Alcohol/week: 0.0 standard drinks of alcohol   Drug use: No     Medication list has been reviewed and updated.  Current Meds  Medication Sig   Alcohol Swabs (B-D SINGLE USE SWABS REGULAR) PADS USE AS DIRECTED   aspirin 81 MG tablet Take 81 mg by mouth daily.   blood glucose meter kit and supplies Dispense based on patient and insurance preference. Use up to four  times daily as directed. (FOR ICD-10 E10.9, E11.9).   brimonidine (ALPHAGAN) 0.2 % ophthalmic solution Place 1 drop into both eyes 2 (two) times daily.    calcium-vitamin D (OSCAL WITH D) 500-200 MG-UNIT tablet Take 1 tablet by mouth.   dorzolamide-timolol (COSOPT) 22.3-6.8 MG/ML ophthalmic solution Place 1 drop into both eyes 2 (two) times daily.   glucose blood (ACCU-CHEK AVIVA) test strip Test blood sugar once daily   losartan (COZAAR) 25 MG tablet TAKE 1 TABLET (25 MG TOTAL) BY MOUTH DAILY.   meloxicam (MOBIC) 15 MG tablet Mobic 15 mg tablet  Take 1 tablet(s) every day by oral route.   sulfamethoxazole-trimethoprim (BACTRIM DS) 800-160 MG tablet Take 1 tablet by mouth 2 (two) times daily for 7 days.       02/03/2022    1:14 PM 04/13/2021    8:42 AM 02/06/2021    3:55 PM 02/02/2021   10:44 AM  GAD 7 : Generalized Anxiety Score  Nervous, Anxious, on Edge 0 0 0 0  Control/stop worrying 0 0 0 0  Worry too much - different things 0 0 0 0  Trouble relaxing 0 0 0 0  Restless 0 0 0 0  Easily annoyed or irritable 0 0 0 0  Afraid - awful might happen 0 0 0 0  Total GAD 7 Score 0 0 0 0  Anxiety Difficulty Not difficult at all Not difficult at all         02/03/2022    1:14 PM 06/22/2021    8:17 AM 04/13/2021    8:42 AM  Depression screen PHQ 2/9  Decreased Interest 0 0 0  Down, Depressed, Hopeless 0 0 0  PHQ - 2 Score 0 0 0  Altered sleeping 0  0  Tired, decreased energy 0  0  Change in appetite 0  0  Feeling bad or failure about yourself  0  0  Trouble concentrating 0  0  Moving slowly or fidgety/restless 0  0  Suicidal thoughts 0  0  PHQ-9 Score 0  0  Difficult doing work/chores Not difficult at all  Not difficult at all    BP Readings from Last 3 Encounters:  02/03/22 122/60  10/27/21 120/78  04/13/21 102/68    Physical Exam Vitals and nursing note reviewed.  Constitutional:      Appearance: She is well-developed.  Cardiovascular:     Rate and Rhythm: Normal rate and  regular rhythm.     Heart sounds: Normal heart sounds.  Pulmonary:     Effort: Pulmonary effort is normal. No respiratory distress.     Breath sounds: Normal breath sounds.  Abdominal:     General: Bowel sounds are normal.     Palpations: Abdomen is soft.     Tenderness: There is  no abdominal tenderness. There is no right CVA tenderness, left CVA tenderness, guarding or rebound.     Wt Readings from Last 3 Encounters:  02/03/22 170 lb (77.1 kg)  10/27/21 173 lb (78.5 kg)  04/13/21 181 lb 9.6 oz (82.4 kg)    BP 122/60   Pulse 75   Ht '5\' 4"'$  (1.626 m)   Wt 170 lb (77.1 kg)   SpO2 97%   BMI 29.18 kg/m   Assessment and Plan: Problem List Items Addressed This Visit   None Visit Diagnoses     UTI symptoms    -  Primary   UA + push fluids antibiotics for 7 days   Relevant Medications   sulfamethoxazole-trimethoprim (BACTRIM DS) 800-160 MG tablet        Partially dictated using [employer], [place]. Any errors are unintentional.  Halina Maidens, MD [facility]  02/03/2022

## 2022-03-22 ENCOUNTER — Encounter: Payer: Self-pay | Admitting: Internal Medicine

## 2022-03-22 ENCOUNTER — Ambulatory Visit (INDEPENDENT_AMBULATORY_CARE_PROVIDER_SITE_OTHER): Payer: Medicare HMO | Admitting: Internal Medicine

## 2022-03-22 VITALS — BP 122/68 | HR 70 | Ht 64.0 in | Wt 169.0 lb

## 2022-03-22 DIAGNOSIS — R399 Unspecified symptoms and signs involving the genitourinary system: Secondary | ICD-10-CM | POA: Diagnosis not present

## 2022-03-22 LAB — POCT URINALYSIS DIPSTICK
Bilirubin, UA: NEGATIVE
Glucose, UA: NEGATIVE
Ketones, UA: NEGATIVE
Leukocytes, UA: NEGATIVE
Nitrite, UA: NEGATIVE
Protein, UA: NEGATIVE
Spec Grav, UA: 1.015 (ref 1.010–1.025)
Urobilinogen, UA: 0.2 E.U./dL
pH, UA: 5 (ref 5.0–8.0)

## 2022-03-22 MED ORDER — SULFAMETHOXAZOLE-TRIMETHOPRIM 800-160 MG PO TABS: 1.0000 | ORAL_TABLET | Freq: Two times a day (BID) | ORAL | 0 refills | Status: AC

## 2022-03-22 NOTE — Progress Notes (Signed)
Date:  03/22/2022   Name:  Natalie Gibbs   DOB:  09-07-1940   MRN:  [id number]   Chief Complaint: Urinary Tract Infection (Pain when urinating.- Burning.)  Dysuria  This is a new problem. The current episode started in the past 7 days. The problem occurs intermittently. The problem has been unchanged. The quality of the pain is described as burning. The pain is mild. There has been no fever. Pertinent negatives include no chills, discharge, flank pain, frequency, hematuria, nausea, urgency or vomiting. She has tried increased fluids for the symptoms. The treatment provided no relief.    Lab Results  Component Value Date   NA 139 04/13/2021   K 4.5 04/13/2021   CO2 25 04/13/2021   GLUCOSE 119 (H) 04/13/2021   BUN 13 04/13/2021   CREATININE 0.73 04/13/2021   CALCIUM 9.3 04/13/2021   EGFR 84 04/13/2021   GFRNONAA 72 04/05/2019   Lab Results  Component Value Date   CHOL 141 04/13/2021   HDL 37 (L) 04/13/2021   LDLCALC 89 04/13/2021   TRIG 74 04/13/2021   CHOLHDL 3.8 04/13/2021   Lab Results  Component Value Date   TSH 1.640 04/13/2021   Lab Results  Component Value Date   HGBA1C 5.1 10/27/2021   Lab Results  Component Value Date   WBC 4.5 04/13/2021   HGB 12.0 04/13/2021   HCT 37.7 04/13/2021   MCV 90 04/13/2021   PLT 249 04/13/2021   Lab Results  Component Value Date   ALT 18 04/13/2021   AST 19 04/13/2021   ALKPHOS 91 04/13/2021   BILITOT 0.3 04/13/2021   Lab Results  Component Value Date   VD25OH 32.5 04/13/2021     Review of Systems  Constitutional:  Negative for chills, fatigue and fever.  Respiratory:  Negative for chest tightness and shortness of breath.   Cardiovascular:  Negative for chest pain.  Gastrointestinal:  Negative for abdominal pain, nausea and vomiting.  Genitourinary:  Positive for dysuria. Negative for flank pain, frequency, hematuria and urgency.  Psychiatric/Behavioral:  Negative for dysphoric mood and sleep  disturbance. The patient is not nervous/anxious.     Patient Active Problem List   Diagnosis Date Noted   Essential hypertension 10/27/2021   Osteoarthritis of knees, bilateral 08/20/2021   Dyslipidemia 06/02/2021   Urine test positive for microalbuminuria 10/08/2020   Osteopenia determined by x-ray 04/05/2019   Abnormal mammogram of right breast 04/19/2017   Intraductal papilloma of breast 10/27/2016   Endometrial hyperplasia 06/14/2016   Prediabetes 05/15/2014   Allergic state 05/15/2014   Fibrocystic breast 05/15/2014   Glaucoma 05/15/2014    Allergies  Allergen Reactions   Iodine Itching   Shellfish Allergy Itching   Codeine Rash    Past Surgical History:  Procedure Laterality Date   BREAST BIOPSY Left 20 + yrs ago   benign   BREAST CYST ASPIRATION Left 2015   neg   BREAST DUCTAL SYSTEM EXCISION Left 10/26/2016   Intraductal papilloma Surgeon: Christene Lye, MD;  Location: [facility] ORS;  Service: General;  Laterality: Left;   CATARACT EXTRACTION, BILATERAL Bilateral 2017   COLONOSCOPY  2010   [facility]. Candace Cruise, [facility]   COLONOSCOPY N/A 05/13/2014   Procedure: COLONOSCOPY;  Surgeon: Hulen Luster, MD;  Location: [facility] ENDOSCOPY;  Service: Gastroenterology;  Laterality: N/A;   COLONOSCOPY  04/28/2016   Dr. Jamal Collin.   COLONOSCOPY WITH PROPOFOL N/A 10/11/2017   Procedure: COLONOSCOPY WITH PROPOFOL;  Surgeon: Alice Reichert, Benay Pike, MD;  Location: [facility] ENDOSCOPY;  Service: Gastroenterology;  Laterality: N/A;   excision mass abdominal wall  1997   EYE SURGERY  2017   bilateral   TUBAL LIGATION      Social History   Tobacco Use   Smoking status: Never   Smokeless tobacco: Never   Tobacco comments:    smoking cessation materials not required  Vaping Use   Vaping Use: Never used  Substance Use Topics   Alcohol use: No    Alcohol/week: 0.0 standard drinks of alcohol   Drug use: No     Medication list has been reviewed and updated.  Current Meds  Medication Sig   Alcohol Swabs  (B-D SINGLE USE SWABS REGULAR) PADS USE AS DIRECTED   aspirin 81 MG tablet Take 81 mg by mouth daily.   blood glucose meter kit and supplies Dispense based on patient and insurance preference. Use up to four times daily as directed. (FOR ICD-10 E10.9, E11.9).   brimonidine (ALPHAGAN) 0.2 % ophthalmic solution Place 1 drop into both eyes 2 (two) times daily.    calcium-vitamin D (OSCAL WITH D) 500-200 MG-UNIT tablet Take 1 tablet by mouth.   dorzolamide-timolol (COSOPT) 22.3-6.8 MG/ML ophthalmic solution Place 1 drop into both eyes 2 (two) times daily.   glucose blood (ACCU-CHEK AVIVA) test strip Test blood sugar once daily   losartan (COZAAR) 25 MG tablet TAKE 1 TABLET (25 MG TOTAL) BY MOUTH DAILY.   meloxicam (MOBIC) 15 MG tablet Mobic 15 mg tablet  Take 1 tablet(s) every day by oral route.   sulfamethoxazole-trimethoprim (BACTRIM DS) 800-160 MG tablet Take 1 tablet by mouth 2 (two) times daily for 3 days.       03/22/2022   11:16 AM 02/03/2022    1:14 PM 04/13/2021    8:42 AM 02/06/2021    3:55 PM  GAD 7 : Generalized Anxiety Score  Nervous, Anxious, on Edge 0 0 0 0  Control/stop worrying 0 0 0 0  Worry too much - different things 0 0 0 0  Trouble relaxing 0 0 0 0  Restless 0 0 0 0  Easily annoyed or irritable 0 0 0 0  Afraid - awful might happen 0 0 0 0  Total GAD 7 Score 0 0 0 0  Anxiety Difficulty Not difficult at all Not difficult at all Not difficult at all        03/22/2022   11:16 AM 02/03/2022    1:14 PM 06/22/2021    8:17 AM  Depression screen PHQ 2/9  Decreased Interest 0 0 0  Down, Depressed, Hopeless 0 0 0  PHQ - 2 Score 0 0 0  Altered sleeping 0 0   Tired, decreased energy 0 0   Change in appetite 0 0   Feeling bad or failure about yourself  0 0   Trouble concentrating 0 0   Moving slowly or fidgety/restless 0 0   Suicidal thoughts 0 0   PHQ-9 Score 0 0   Difficult doing work/chores Not difficult at all Not difficult at all     BP Readings from Last 3  Encounters:  03/22/22 122/68  02/03/22 122/60  10/27/21 120/78    Physical Exam Vitals and nursing note reviewed.  Constitutional:      Appearance: She is well-developed.  Cardiovascular:     Rate and Rhythm: Normal rate and regular rhythm.     Heart sounds: Normal heart sounds.  Pulmonary:     Effort: Pulmonary effort is normal. No  respiratory distress.     Breath sounds: Normal breath sounds.  Abdominal:     General: Bowel sounds are normal.     Palpations: Abdomen is soft.     Tenderness: There is no abdominal tenderness. There is no right CVA tenderness, left CVA tenderness, guarding or rebound.  Neurological:     General: No focal deficit present.   Urine dipstick shows positive for RBC's.  Micro exam: not done.   Wt Readings from Last 3 Encounters:  03/22/22 169 lb (76.7 kg)  02/03/22 170 lb (77.1 kg)  10/27/21 173 lb (78.5 kg)    BP 122/68   Pulse 70   Ht 5\' 4"  (1.626 m)   Wt 169 lb (76.7 kg)   SpO2 95%   BMI 29.01 kg/m   Assessment and Plan:  Problem List Items Addressed This Visit   None Visit Diagnoses     UTI symptoms    -  Primary   will treat with 3 days of Bactrim pending Urine culture from [facility] if symptoms continue, will need pelvic exam to rule other cause of symptoms   Relevant Medications   sulfamethoxazole-trimethoprim (BACTRIM DS) 800-160 MG tablet   Other Relevant Orders   POCT Urinalysis Dipstick (Completed)       No follow-ups on file.   Partially dictated using [place], any errors are not intentional.  Glean Hess, MD [place], [place]

## 2022-04-28 ENCOUNTER — Ambulatory Visit: Payer: Medicare HMO | Admitting: Internal Medicine

## 2022-04-28 ENCOUNTER — Encounter: Payer: Medicare HMO | Admitting: Internal Medicine

## 2022-04-30 ENCOUNTER — Other Ambulatory Visit: Payer: Self-pay | Admitting: Internal Medicine

## 2022-04-30 DIAGNOSIS — R809 Proteinuria, unspecified: Secondary | ICD-10-CM

## 2022-04-30 NOTE — Telephone Encounter (Signed)
Requested Prescriptions  Pending Prescriptions Disp Refills   losartan (COZAAR) 25 MG tablet [Pharmacy Med Name: LOSARTAN POTASSIUM 25 MG Tablet] 90 tablet 0    Sig: TAKE 1 TABLET EVERY DAY     Cardiovascular:  Angiotensin Receptor Blockers Failed - 04/30/2022  2:25 AM      Failed - Cr in normal range and within 180 days    Creatinine  Date Value Ref Range Status  05/19/2012 0.70 0.60 - 1.30 mg/dL Final   Creatinine, Ser  Date Value Ref Range Status  04/13/2021 0.73 0.57 - 1.00 mg/dL Final         Failed - K in normal range and within 180 days    Potassium  Date Value Ref Range Status  04/13/2021 4.5 3.5 - 5.2 mmol/L Final  05/19/2012 4.0 3.5 - 5.1 mmol/L Final         Passed - Patient is not pregnant      Passed - Last BP in normal range    BP Readings from Last 1 Encounters:  03/22/22 122/68         Passed - Valid encounter within last 6 months    Recent Outpatient Visits           1 month ago UTI symptoms   [facility], Nyoka Cowden, MD   2 months ago UTI symptoms   [facility] at [facility], Nyoka Cowden, MD   6 months ago Essential hypertension   [facility], Nyoka Cowden, MD   1 year ago Annual physical exam   [facility] at [facility], Nyoka Cowden, MD   1 year ago UTI symptoms   [facility] at [facility], Nyoka Cowden, MD

## 2022-05-03 ENCOUNTER — Other Ambulatory Visit: Payer: Self-pay | Admitting: Internal Medicine

## 2022-05-03 DIAGNOSIS — Z1231 Encounter for screening mammogram for malignant neoplasm of breast: Secondary | ICD-10-CM

## 2022-06-14 ENCOUNTER — Ambulatory Visit
Admission: RE | Admit: 2022-06-14 | Discharge: 2022-06-14 | Disposition: A | Discharge status: Home / Self Care | Payer: Medicare HMO | Source: Ambulatory Visit | Attending: Internal Medicine | Admitting: Internal Medicine

## 2022-06-14 DIAGNOSIS — Z1231 Encounter for screening mammogram for malignant neoplasm of breast: Secondary | ICD-10-CM | POA: Diagnosis present

## 2022-06-30 ENCOUNTER — Ambulatory Visit (INDEPENDENT_AMBULATORY_CARE_PROVIDER_SITE_OTHER): Payer: Medicare HMO

## 2022-06-30 VITALS — Ht 64.0 in | Wt 169.0 lb

## 2022-06-30 DIAGNOSIS — Z0000 Encounter for general adult medical examination without abnormal findings: Secondary | ICD-10-CM | POA: Diagnosis not present

## 2022-06-30 NOTE — Patient Instructions (Signed)
Ms. Natalie Gibbs , Thank you for taking time to come for your Medicare Wellness Visit. I appreciate your ongoing commitment to your health goals. Please review the following plan we discussed and let me know if I can assist you in the future.   These are the goals we discussed:  Goals      DIET - EAT MORE FRUITS AND VEGETABLES     DIET - INCREASE WATER INTAKE     Recommend to drink at least 6-8 8oz glasses of water per day.     Increase physical activity        This is a list of the screening recommended for you and due dates:  Health Maintenance  Topic Date Due   Zoster (Shingles) Vaccine (1 of 2) Never done   COVID-19 Vaccine (3 - Pfizer risk series) 03/16/2019   Colon Cancer Screening  10/12/2022   Flu Shot  08/05/2022   Mammogram  06/14/2023   Medicare Annual Wellness Visit  06/30/2023   DTaP/Tdap/Td vaccine (2 - Td or Tdap) 03/09/2025   Pneumonia Vaccine  Completed   DEXA scan (bone density measurement)  Completed   HPV Vaccine  Aged Out   Hepatitis C Screening  Discontinued    Advanced directives: no  Conditions/risks identified: none  Next appointment: Follow up in one year for your annual wellness visit 07/06/23 @ 8:45 am by phone   Preventive Care 65 Years and Older, Female Preventive care refers to lifestyle choices and visits with your health care provider that can promote health and wellness. What does preventive care include? A yearly physical exam. This is also called an annual well check. Dental exams once or twice a year. Routine eye exams. Ask your health care provider how often you should have your eyes checked. Personal lifestyle choices, including: Daily care of your teeth and gums. Regular physical activity. Eating a healthy diet. Avoiding tobacco and drug use. Limiting alcohol use. Practicing safe sex. Taking low-dose aspirin every day. Taking vitamin and mineral supplements as recommended by your health care provider. What happens during an  annual well check? The services and screenings done by your health care provider during your annual well check will depend on your age, overall health, lifestyle risk factors, and family history of disease. Counseling  Your health care provider may ask you questions about your: Alcohol use. Tobacco use. Drug use. Emotional well-being. Home and relationship well-being. Sexual activity. Eating habits. History of falls. Memory and ability to understand (cognition). Work and work Astronomer. Reproductive health. Screening  You may have the following tests or measurements: Height, weight, and BMI. Blood pressure. Lipid and cholesterol levels. These may be checked every 5 years, or more frequently if you are over 59 years old. Skin check. Lung cancer screening. You may have this screening every year starting at age 39 if you have a 30-pack-year history of smoking and currently smoke or have quit within the past 15 years. Fecal occult blood test (FOBT) of the stool. You may have this test every year starting at age 61. Flexible sigmoidoscopy or colonoscopy. You may have a sigmoidoscopy every 5 years or a colonoscopy every 10 years starting at age 59. Hepatitis C blood test. Hepatitis B blood test. Sexually transmitted disease (STD) testing. Diabetes screening. This is done by checking your blood sugar (glucose) after you have not eaten for a while (fasting). You may have this done every 1-3 years. Bone density scan. This is done to screen for osteoporosis. You may have  this done starting at age 49. Mammogram. This may be done every 1-2 years. Talk to your health care provider about how often you should have regular mammograms. Talk with your health care provider about your test results, treatment options, and if necessary, the need for more tests. Vaccines  Your health care provider may recommend certain vaccines, such as: Influenza vaccine. This is recommended every year. Tetanus,  diphtheria, and acellular pertussis (Tdap, Td) vaccine. You may need a Td booster every 10 years. Zoster vaccine. You may need this after age 34. Pneumococcal 13-valent conjugate (PCV13) vaccine. One dose is recommended after age 76. Pneumococcal polysaccharide (PPSV23) vaccine. One dose is recommended after age 5. Talk to your health care provider about which screenings and vaccines you need and how often you need them. This information is not intended to replace advice given to you by your health care provider. Make sure you discuss any questions you have with your health care provider. Document Released: 01/17/2015 Document Revised: 09/10/2015 Document Reviewed: 10/22/2014 Elsevier Interactive Patient Education  2017 [place] Prevention in the Home Falls can cause injuries. They can happen to people of all ages. There are many things you can do to make your home safe and to help prevent falls. What can I do on the outside of my home? Regularly fix the edges of walkways and driveways and fix any cracks. Remove anything that might make you trip as you walk through a door, such as a raised step or threshold. Trim any bushes or trees on the path to your home. Use bright outdoor lighting. Clear any walking paths of anything that might make someone trip, such as rocks or tools. Regularly check to see if handrails are loose or broken. Make sure that both sides of any steps have handrails. Any raised decks and porches should have guardrails on the edges. Have any leaves, snow, or ice cleared regularly. Use sand or salt on walking paths during winter. Clean up any spills in your garage right away. This includes oil or grease spills. What can I do in the bathroom? Use night lights. Install grab bars by the toilet and in the tub and shower. Do not use towel bars as grab bars. Use non-skid mats or decals in the tub or shower. If you need to sit down in the shower, use a plastic,  non-slip stool. Keep the floor dry. Clean up any water that spills on the floor as soon as it happens. Remove soap buildup in the tub or shower regularly. Attach bath mats securely with double-sided non-slip rug tape. Do not have throw rugs and other things on the floor that can make you trip. What can I do in the bedroom? Use night lights. Make sure that you have a light by your bed that is easy to reach. Do not use any sheets or blankets that are too big for your bed. They should not hang down onto the floor. Have a firm chair that has side arms. You can use this for support while you get dressed. Do not have throw rugs and other things on the floor that can make you trip. What can I do in the kitchen? Clean up any spills right away. Avoid walking on wet floors. Keep items that you use a lot in easy-to-reach places. If you need to reach something above you, use a strong step stool that has a grab bar. Keep electrical cords out of the way. Do not use floor polish or  wax that makes floors slippery. If you must use wax, use non-skid floor wax. Do not have throw rugs and other things on the floor that can make you trip. What can I do with my stairs? Do not leave any items on the stairs. Make sure that there are handrails on both sides of the stairs and use them. Fix handrails that are broken or loose. Make sure that handrails are as long as the stairways. Check any carpeting to make sure that it is firmly attached to the stairs. Fix any carpet that is loose or worn. Avoid having throw rugs at the top or bottom of the stairs. If you do have throw rugs, attach them to the floor with carpet tape. Make sure that you have a light switch at the top of the stairs and the bottom of the stairs. If you do not have them, ask someone to add them for you. What else can I do to help prevent falls? Wear shoes that: Do not have high heels. Have rubber bottoms. Are comfortable and fit you well. Are closed  at the toe. Do not wear sandals. If you use a stepladder: Make sure that it is fully opened. Do not climb a closed stepladder. Make sure that both sides of the stepladder are locked into place. Ask someone to hold it for you, if possible. Clearly mark and make sure that you can see: Any grab bars or handrails. First and last steps. Where the edge of each step is. Use tools that help you move around (mobility aids) if they are needed. These include: Canes. Walkers. Scooters. Crutches. Turn on the lights when you go into a dark area. Replace any light bulbs as soon as they burn out. Set up your furniture so you have a clear path. Avoid moving your furniture around. If any of your floors are uneven, fix them. If there are any pets around you, be aware of where they are. Review your medicines with your doctor. Some medicines can make you feel dizzy. This can increase your chance of falling. Ask your doctor what other things that you can do to help prevent falls. This information is not intended to replace advice given to you by your health care provider. Make sure you discuss any questions you have with your health care provider. Document Released: 10/17/2008 Document Revised: 05/29/2015 Document Reviewed: 01/25/2014 Elsevier Interactive Patient Education  2017 [employer].

## 2022-06-30 NOTE — Progress Notes (Signed)
Subjective:   Natalie Gibbs is a 82 y.o. female who presents for Medicare Annual (Subsequent) preventive examination.  Visit Complete: Virtual  I connected with  [name] on 06/30/22 by a audio enabled telemedicine application and verified that I am speaking with the correct person using two identifiers.  Patient Location: Home  Provider Location: Office/Clinic  I discussed the limitations of evaluation and management by telemedicine. The patient expressed understanding and agreed to proceed.    Review of Systems     Cardiac Risk Factors include: hypertension;advanced age (>42men, >73 women);dyslipidemia     Objective:    Today's Vitals   06/30/22 0932 06/30/22 0947  Weight:  169 lb (76.7 kg)  Height:  5\' 4"  (1.626 m)  PainSc: 4     Body mass index is 29.01 kg/m.     06/30/2022    9:39 AM 06/22/2021    8:18 AM 04/23/2020    9:36 AM 04/23/2019    9:31 AM 03/29/2018   11:25 AM 10/11/2017   10:50 AM 03/23/2017    8:25 AM  Advanced Directives  Does Patient Have a Medical Advance Directive? No Yes Yes No Yes Yes Yes  Type of Furniture conservator/restorer;Living will Healthcare Power of [place];Living will  Living will;Healthcare Power of Attorney Living will Healthcare Power of [place];Living will  Copy of Healthcare Power of Attorney in Chart?  No - copy requested No - copy requested  No - copy requested  No - copy requested  Would patient like information on creating a medical advance directive? No - Patient declined   Yes (MAU/Ambulatory/Procedural Areas - Information given)       Current Medications (verified) Outpatient Encounter Medications as of 06/30/2022  Medication Sig   Alcohol Swabs (B-D SINGLE USE SWABS REGULAR) PADS USE AS DIRECTED   aspirin 81 MG tablet Take 81 mg by mouth daily.   blood glucose meter kit and supplies Dispense based on patient and insurance preference. Use up to four times daily as directed. (FOR  ICD-10 E10.9, E11.9).   brimonidine (ALPHAGAN) 0.2 % ophthalmic solution Place 1 drop into both eyes 2 (two) times daily.    calcium-vitamin D (OSCAL WITH D) 500-200 MG-UNIT tablet Take 1 tablet by mouth.   dorzolamide-timolol (COSOPT) 22.3-6.8 MG/ML ophthalmic solution Place 1 drop into both eyes 2 (two) times daily.   glucose blood (ACCU-CHEK AVIVA) test strip Test blood sugar once daily   losartan (COZAAR) 25 MG tablet TAKE 1 TABLET EVERY DAY   meloxicam (MOBIC) 15 MG tablet Mobic 15 mg tablet  Take 1 tablet(s) every day by oral route.   No facility-administered encounter medications on file as of 06/30/2022.    Allergies (verified) Iodine, Shellfish allergy, and Codeine   History: Past Medical History:  Diagnosis Date   Allergy    Arthritis    Diffuse cystic mastopathy    Glaucoma    Hypertension    Obesity, unspecified    Pre-diabetes    Special screening for malignant neoplasms, colon    Past Surgical History:  Procedure Laterality Date   BREAST BIOPSY Left 20 + yrs ago   benign   BREAST CYST ASPIRATION Left 2015   neg   BREAST DUCTAL SYSTEM EXCISION Left 10/26/2016   Intraductal papilloma Surgeon: Kieth Brightly, MD;  Location: [facility] ORS;  Service: General;  Laterality: Left;   CATARACT EXTRACTION, BILATERAL Bilateral 2017   COLONOSCOPY  2010   Dr. Bluford Kaufmann, [facility]  COLONOSCOPY N/A 05/13/2014   Procedure: COLONOSCOPY;  Surgeon: Wallace Cullens, MD;  Location: [facility] ENDOSCOPY;  Service: Gastroenterology;  Laterality: N/A;   COLONOSCOPY  04/28/2016   Dr. Evette Cristal.   COLONOSCOPY WITH PROPOFOL N/A 10/11/2017   Procedure: COLONOSCOPY WITH PROPOFOL;  Surgeon: Toledo, Boykin Nearing, MD;  Location: [facility] ENDOSCOPY;  Service: Gastroenterology;  Laterality: N/A;   excision mass abdominal wall  1997   EYE SURGERY  2017   bilateral   TUBAL LIGATION     Family History  Problem Relation Age of Onset   Healthy Mother    Emphysema Father    Breast cancer Other        niece/breast    Diabetes Sister    Diabetes Sister    Breast cancer Other        niece   Kidney failure Brother    Multiple sclerosis Daughter    Social History   Socioeconomic History   Marital status: Married    Spouse name: Not on file   Number of children: 4   Years of education: Not on file   Highest education level: 10th grade  Occupational History   Occupation: Retired    Associate Professor: OTHER  Tobacco Use   Smoking status: Never   Smokeless tobacco: Never   Tobacco comments:    smoking cessation materials not required  Vaping Use   Vaping Use: Never used  Substance and Sexual Activity   Alcohol use: No    Alcohol/week: 0.0 standard drinks of alcohol   Drug use: No   Sexual activity: Never  Other Topics Concern   Not on file  Social History Narrative   Not on file   Social Determinants of Health   Financial Resource Strain: Low Risk  (06/30/2022)   Overall Financial Resource Strain (CARDIA)    Difficulty of Paying Living Expenses: Not hard at all  Food Insecurity: No Food Insecurity (06/30/2022)   Hunger Vital Sign    Worried About Running Out of Food in the Last Year: Never true    Ran Out of Food in the Last Year: Never true  Transportation Needs: No Transportation Needs (06/30/2022)   PRAPARE - Administrator, Civil Service (Medical): No    Lack of Transportation (Non-Medical): No  Physical Activity: Insufficiently Active (06/30/2022)   Exercise Vital Sign    Days of Exercise per Week: 3 days    Minutes of Exercise per Session: 30 min  Stress: No Stress Concern Present (06/30/2022)   [employer] of Occupational Health - Occupational Stress Questionnaire    Feeling of Stress : Only a little  Social Connections: Socially Isolated (06/30/2022)   Social Connection and Isolation Panel [NHANES]    Frequency of Communication with Friends and Family: More than three times a week    Frequency of Social Gatherings with Friends and Family: More than three times a week     Attends Religious Services: Never    Database administrator or Organizations: No    Attends Banker Meetings: Never    Marital Status: Widowed    Tobacco Counseling Counseling given: Not Answered Tobacco comments: smoking cessation materials not required   Clinical Intake:  Pre-visit preparation completed: Yes  Pain : 0-10 Pain Score: 4  Pain Type: Chronic pain Pain Location: Back     Nutritional Risks: None Diabetes: No  How often do you need to have someone help you when you read instructions, pamphlets, or other written materials from your  doctor or pharmacy?: 1 - Never  Interpreter Needed?: No  Information entered by :: Kennedy Bucker, LPN   Activities of Daily Living    06/30/2022    9:33 AM  In your present state of health, do you have any difficulty performing the following activities:  Hearing? 1  Comment has hearing aids- left ear  Vision? 0  Difficulty concentrating or making decisions? 0  Walking or climbing stairs? 0  Dressing or bathing? 0  Doing errands, shopping? 0  Preparing Food and eating ? N  Using the Toilet? N  In the past six months, have you accidently leaked urine? N  Do you have problems with loss of bowel control? N  Managing your Medications? N  Managing your Finances? N  Housekeeping or managing your Housekeeping? N    Patient Care Team: Reubin Milan, MD as PCP - General (Internal Medicine) Irene Limbo., MD (Ophthalmology)  Indicate any recent Medical Services you may have received from other than [facility] providers in the past year (date may be approximate).     Assessment:   This is a routine wellness examination for Lesly Pontarelli.  Hearing/Vision screen Hearing Screening - Comments:: No aids  Vision Screening - Comments:: Glasses= Dr.Bell   Dietary issues and exercise activities discussed:     Goals Addressed             This Visit's Progress    DIET - EAT MORE FRUITS AND VEGETABLES          Depression Screen    06/30/2022    9:37 AM 03/22/2022   11:16 AM 02/03/2022    1:14 PM 06/22/2021    8:17 AM 04/13/2021    8:42 AM 02/06/2021    3:55 PM 02/02/2021   10:44 AM  PHQ 2/9 Scores  PHQ - 2 Score 0 0 0 0 0 0 0  PHQ- 9 Score 0 0 0  0 0 0    Fall Risk    06/30/2022    9:39 AM 03/22/2022   11:16 AM 02/03/2022    1:14 PM 06/22/2021    8:19 AM 04/13/2021    8:42 AM  Fall Risk   Falls in the past year? 0 0 0 0 0  Number falls in past yr: 0 0 0 0 0  Injury with Fall? 0 0 0 0 0  Risk for fall due to : No Fall Risks History of fall(s) No Fall Risks No Fall Risks No Fall Risks  Follow up Falls prevention discussed;Falls evaluation completed Falls evaluation completed Falls evaluation completed Falls prevention discussed Falls evaluation completed    MEDICARE RISK AT HOME:  Medicare Risk at Home - 06/30/22 0939     Any stairs in or around the home? No    If so, are there any without handrails? No    Home free of loose throw rugs in walkways, pet beds, electrical cords, etc? Yes    Adequate lighting in your home to reduce risk of falls? Yes    Life alert? No    Use of a cane, walker or w/c? No    Grab bars in the bathroom? Yes    Shower chair or bench in shower? Yes    Elevated toilet seat or a handicapped toilet? Yes             TIMED UP AND GO:  Was the test performed?  No    Cognitive Function:        06/30/2022  9:40 AM 04/23/2019    9:34 AM 03/29/2018   11:29 AM 03/23/2017    8:30 AM 03/31/2016   10:06 AM  6CIT Screen  What Year? 0 points 0 points 0 points 0 points 0 points  What month? 0 points 0 points 0 points 0 points 0 points  What time? 0 points 0 points 0 points 3 points 0 points  Count back from 20 0 points 0 points 0 points 0 points 0 points  Months in reverse 0 points 4 points 4 points 0 points 0 points  Repeat phrase 0 points 6 points 2 points 8 points 2 points  Total Score 0 points 10 points 6 points 11 points 2 points     Immunizations Immunization History  Administered Date(s) Administered   Fluad Quad(high Dose 65+) 10/22/2019, 10/08/2020, 02/06/2021, 10/27/2021   Influenza,inj,Quad PF,6+ Mos 09/10/2015, 03/23/2017   PFIZER(Purple Top)SARS-COV-2 Vaccination 01/31/2019, 02/16/2019   Pneumococcal Conjugate-13 03/06/2014   Pneumococcal Polysaccharide-23 03/06/2008   Tdap 03/10/2015    TDAP status: Up to date  Flu Vaccine status: Up to date  Pneumococcal vaccine status: Up to date  Covid-19 vaccine status: Completed vaccines  Qualifies for Shingles Vaccine? Yes   Zostavax completed No   Shingrix Completed?: No.    Education has been provided regarding the importance of this vaccine. Patient has been advised to call insurance company to determine out of pocket expense if they have not yet received this vaccine. Advised may also receive vaccine at local pharmacy or Health Dept. Verbalized acceptance and understanding.  Screening Tests Health Maintenance  Topic Date Due   Zoster Vaccines- Shingrix (1 of 2) Never done   COVID-19 Vaccine (3 - Pfizer risk series) 03/16/2019   Colonoscopy  10/12/2022   INFLUENZA VACCINE  08/05/2022   MAMMOGRAM  06/14/2023   Medicare Annual Wellness (AWV)  06/30/2023   DTaP/Tdap/Td (2 - Td or Tdap) 03/09/2025   Pneumonia Vaccine 55+ Years old  Completed   DEXA SCAN  Completed   HPV VACCINES  Aged Out   Hepatitis C Screening  Discontinued    Health Maintenance  Health Maintenance Due  Topic Date Due   Zoster Vaccines- Shingrix (1 of 2) Never done   COVID-19 Vaccine (3 - Pfizer risk series) 03/16/2019   Colonoscopy  10/12/2022    Colorectal cancer screening: No longer required.   Mammogram status: No longer required due to age.  Bone Density status: Completed 06/09/21. Results reflect: Bone density results: OSTEOPENIA. Repeat every 5 years.  Lung Cancer Screening: (Low Dose CT Chest recommended if Age 59-80 years, 20 pack-year currently smoking OR have  quit w/in 15years.) does not qualify.     Additional Screening:  Hepatitis C Screening: does not qualify; Completed 10/22/19  Vision Screening: Recommended annual ophthalmology exams for early detection of glaucoma and other disorders of the eye. Is the patient up to date with their annual eye exam?  Yes  Who is the provider or what is the name of the office in which the patient attends annual eye exams? Dr.Bell If pt is not established with a provider, would they like to be referred to a provider to establish care? No .   Dental Screening: Recommended annual dental exams for proper oral hygiene    Community Resource Referral / Chronic Care Management: CRR required this visit?  No   CCM required this visit?  No     Plan:     I have personally reviewed and noted the following in the patient's  chart:   Medical and social history Use of alcohol, tobacco or illicit drugs  Current medications and supplements including opioid prescriptions. Patient is not currently taking opioid prescriptions. Functional ability and status Nutritional status Physical activity Advanced directives List of other physicians Hospitalizations, surgeries, and ER visits in previous 12 months Vitals Screenings to include cognitive, depression, and falls Referrals and appointments  In addition, I have reviewed and discussed with patient certain preventive protocols, quality metrics, and best practice recommendations. A written personalized care plan for preventive services as well as general preventive health recommendations were provided to patient.     Hal Hope, LPN   [id number]   After Visit Summary: (MyChart) Due to this being a telephonic visit, the after visit summary with patients personalized plan was offered to patient via MyChart   Nurse Notes: none

## 2022-07-12 ENCOUNTER — Other Ambulatory Visit: Payer: Self-pay | Admitting: Internal Medicine

## 2022-07-12 DIAGNOSIS — R809 Proteinuria, unspecified: Secondary | ICD-10-CM

## 2022-12-06 ENCOUNTER — Other Ambulatory Visit: Payer: Self-pay | Admitting: Internal Medicine

## 2022-12-06 DIAGNOSIS — R809 Proteinuria, unspecified: Secondary | ICD-10-CM

## 2022-12-08 ENCOUNTER — Other Ambulatory Visit: Payer: Self-pay

## 2022-12-08 NOTE — Telephone Encounter (Signed)
Requested medication (s) are due for refill today - no  Requested medication (s) are on the active medication list -yes  Future visit scheduled -yes  Last refill: 07/12/22 #90 1RF  Notes to clinic: too early for RF- but fails lab protocol- over 1 year- 04/13/21  Requested Prescriptions  Pending Prescriptions Disp Refills   losartan (COZAAR) 25 MG tablet [Pharmacy Med Name: Losartan Potassium Oral Tablet 25 MG] 90 tablet 3    Sig: TAKE 1 TABLET EVERY DAY     Cardiovascular:  Angiotensin Receptor Blockers Failed - 12/06/2022  2:39 AM      Failed - Cr in normal range and within 180 days    Creatinine  Date Value Ref Range Status  05/19/2012 0.70 0.60 - 1.30 mg/dL Final   Creatinine, Ser  Date Value Ref Range Status  04/13/2021 0.73 0.57 - 1.00 mg/dL Final         Failed - K in normal range and within 180 days    Potassium  Date Value Ref Range Status  04/13/2021 4.5 3.5 - 5.2 mmol/L Final  05/19/2012 4.0 3.5 - 5.1 mmol/L Final         Passed - Patient is not pregnant      Passed - Last BP in normal range    BP Readings from Last 1 Encounters:  03/22/22 122/68         Passed - Valid encounter within last 6 months    Recent Outpatient Visits           8 months ago UTI symptoms   [place] [facility] at [facility], Nyoka Cowden, MD   10 months ago UTI symptoms   [facility] at [facility], Nyoka Cowden, MD   1 year ago Essential hypertension   [place] [facility] at [facility], Nyoka Cowden, MD   1 year ago Annual physical exam   [facility] at [facility], Nyoka Cowden, MD   1 year ago UTI symptoms   [place] [facility] at [facility], Nyoka Cowden, MD       Future Appointments             In 1 month Reubin Milan, MD [facility] Care & Sports Medicine at [facility], [facility]                Requested Prescriptions  Pending Prescriptions Disp Refills   losartan (COZAAR) 25 MG tablet [Pharmacy Med Name: Losartan Potassium Oral Tablet 25 MG] 90 tablet 3    Sig: TAKE 1 TABLET EVERY DAY     Cardiovascular:  Angiotensin Receptor Blockers Failed - 12/06/2022  2:39 AM      Failed - Cr in normal range and within 180 days    Creatinine  Date Value Ref Range Status  05/19/2012 0.70 0.60 - 1.30 mg/dL Final   Creatinine, Ser  Date Value Ref Range Status  04/13/2021 0.73 0.57 - 1.00 mg/dL Final         Failed - K in normal range and within 180 days    Potassium  Date Value Ref Range Status  04/13/2021 4.5 3.5 - 5.2 mmol/L Final  05/19/2012 4.0 3.5 - 5.1 mmol/L Final         Passed - Patient is not pregnant      Passed - Last BP in  normal range    BP Readings from Last 1 Encounters:  03/22/22 122/68         Passed - Valid encounter within last 6 months    Recent Outpatient Visits           8 months ago UTI symptoms   [place] [facility] at [facility], Nyoka Cowden, MD   10 months ago UTI symptoms   [facility] at [facility], Nyoka Cowden, MD   1 year ago Essential hypertension   [place] [facility] at [facility], Nyoka Cowden, MD   1 year ago Annual physical exam   [facility] Medicine at [facility], Nyoka Cowden, MD   1 year ago UTI symptoms   [facility] at [facility], Nyoka Cowden, MD       Future Appointments             In 1 month Judithann Graves, Nyoka Cowden, MD [facility] Primary Care & Sports Medicine at [facility], [facility]

## 2022-12-29 ENCOUNTER — Other Ambulatory Visit: Payer: Self-pay | Admitting: Internal Medicine

## 2022-12-29 DIAGNOSIS — R809 Proteinuria, unspecified: Secondary | ICD-10-CM

## 2022-12-30 NOTE — Telephone Encounter (Signed)
Requested medication (s) are due for refill today: yes   Requested medication (s) are on the active medication list: yes   Last refill:  12/08/22 #30 0 refills  Future visit scheduled: yes in 1 week   Notes to clinic:  courtesy refill given 12/08/22. Do you want to give a 2nd courtesy refill?     Requested Prescriptions  Pending Prescriptions Disp Refills   losartan (COZAAR) 25 MG tablet [Pharmacy Med Name: Losartan Potassium Oral Tablet 25 MG] 30 tablet 11    Sig: TAKE 1 TABLET EVERY DAY (NEED MD APPOINTMENT FOR REFILLS)     Cardiovascular:  Angiotensin Receptor Blockers Failed - 12/30/2022  3:43 PM      Failed - Cr in normal range and within 180 days    Creatinine  Date Value Ref Range Status  05/19/2012 0.70 0.60 - 1.30 mg/dL Final   Creatinine, Ser  Date Value Ref Range Status  04/13/2021 0.73 0.57 - 1.00 mg/dL Final         Failed - K in normal range and within 180 days    Potassium  Date Value Ref Range Status  04/13/2021 4.5 3.5 - 5.2 mmol/L Final  05/19/2012 4.0 3.5 - 5.1 mmol/L Final         Failed - Valid encounter within last 6 months    Recent Outpatient Visits           9 months ago UTI symptoms   [place] [facility] at [facility], Nyoka Cowden, MD   11 months ago UTI symptoms   [facility] & [facility] Medicine at [facility], Nyoka Cowden, MD   1 year ago Essential hypertension   [place] [facility] at [facility], Nyoka Cowden, MD   1 year ago Annual physical exam   [facility] Medicine at [facility], Nyoka Cowden, MD   1 year ago UTI symptoms   [facility] at [facility], Nyoka Cowden, MD       Future Appointments             In 1 week Judithann Graves Nyoka Cowden, MD [facility] Primary Care & Sports Medicine at [facility], [facility]            Passed - Patient is not pregnant      Passed - Last  BP in normal range    BP Readings from Last 1 Encounters:  03/22/22 122/68

## 2023-01-10 ENCOUNTER — Telehealth: Payer: Self-pay | Admitting: Internal Medicine

## 2023-01-10 ENCOUNTER — Encounter: Payer: Medicare HMO | Admitting: Internal Medicine

## 2023-01-10 NOTE — Telephone Encounter (Signed)
 Copied from CRM [id number]. Topic: Appointments - Appointment Cancel/Reschedule >> Jan 10, 2023  8:20 AM Rosaria A wrote: Reason for CRM: Pt called to cancel her appt today due to the weather and was wanting to reschedule. Next available appt with PCP for  a physical is showing for May. Pt is wanting to be seen before May. Please call pt back.

## 2023-01-10 NOTE — Telephone Encounter (Signed)
 Please review.  KP

## 2023-01-10 NOTE — Telephone Encounter (Signed)
 Scheduled for Feb 10th at 10:20.  [facility]

## 2023-01-22 ENCOUNTER — Other Ambulatory Visit: Payer: Self-pay | Admitting: Internal Medicine

## 2023-01-22 DIAGNOSIS — R809 Proteinuria, unspecified: Secondary | ICD-10-CM

## 2023-01-24 NOTE — Telephone Encounter (Signed)
Pt given enough med to last until upcoming appt  Requested Prescriptions  Pending Prescriptions Disp Refills   losartan (COZAAR) 25 MG tablet [Pharmacy Med Name: Losartan Potassium Oral Tablet 25 MG] 24 tablet 0    Sig: TAKE 1 TABLET EVERY DAY (NEED MD APPOINTMENT FOR REFILLS)     Cardiovascular:  Angiotensin Receptor Blockers Failed - 01/24/2023  9:30 AM      Failed - Cr in normal range and within 180 days    Creatinine  Date Value Ref Range Status  05/19/2012 0.70 0.60 - 1.30 mg/dL Final   Creatinine, Ser  Date Value Ref Range Status  04/13/2021 0.73 0.57 - 1.00 mg/dL Final         Failed - K in normal range and within 180 days    Potassium  Date Value Ref Range Status  04/13/2021 4.5 3.5 - 5.2 mmol/L Final  05/19/2012 4.0 3.5 - 5.1 mmol/L Final         Failed - Valid encounter within last 6 months    Recent Outpatient Visits           10 months ago UTI symptoms   [place] [facility] at [facility], Nyoka Cowden, MD   11 months ago UTI symptoms   [facility] Primary Care & Sports Medicine at [facility], Nyoka Cowden, MD   1 year ago Essential hypertension   [place] [facility] at [facility], Nyoka Cowden, MD   1 year ago Annual physical exam   [facility] Medicine at [facility], Nyoka Cowden, MD   1 year ago UTI symptoms   [facility] Care & Sports Medicine at [facility], Nyoka Cowden, MD       Future Appointments             In 3 weeks Judithann Graves Nyoka Cowden, MD [facility] Primary Care [facility] Sports Medicine at [facility], [facility]            Passed - Patient is not pregnant      Passed - Last BP in normal range    BP Readings from Last 1 Encounters:  03/22/22 122/68

## 2023-02-14 ENCOUNTER — Ambulatory Visit (INDEPENDENT_AMBULATORY_CARE_PROVIDER_SITE_OTHER): Payer: Medicare HMO | Admitting: Internal Medicine

## 2023-02-14 ENCOUNTER — Encounter: Payer: Self-pay | Admitting: Internal Medicine

## 2023-02-14 VITALS — BP 122/76 | HR 72 | Ht 64.0 in | Wt 161.2 lb

## 2023-02-14 DIAGNOSIS — I10 Essential (primary) hypertension: Secondary | ICD-10-CM | POA: Diagnosis not present

## 2023-02-14 DIAGNOSIS — R809 Proteinuria, unspecified: Secondary | ICD-10-CM

## 2023-02-14 DIAGNOSIS — Z0000 Encounter for general adult medical examination without abnormal findings: Secondary | ICD-10-CM | POA: Diagnosis not present

## 2023-02-14 DIAGNOSIS — R7303 Prediabetes: Secondary | ICD-10-CM

## 2023-02-14 DIAGNOSIS — E785 Hyperlipidemia, unspecified: Secondary | ICD-10-CM

## 2023-02-14 DIAGNOSIS — Z1231 Encounter for screening mammogram for malignant neoplasm of breast: Secondary | ICD-10-CM

## 2023-02-14 MED ORDER — LOSARTAN POTASSIUM 25 MG PO TABS: 25.0000 mg | ORAL_TABLET | Freq: Every day | ORAL | 3 refills | Status: AC

## 2023-02-14 NOTE — Assessment & Plan Note (Signed)
Managed with diet changes.

## 2023-02-14 NOTE — Assessment & Plan Note (Signed)
 Controlled BP with normal exam. Current regimen is losartan. Will continue same medications; encourage continued reduced sodium diet.

## 2023-02-14 NOTE — Progress Notes (Signed)
 Date:  02/14/2023   Name:  Natalie Gibbs   DOB:  02-15-1940   MRN:  [id number]   Chief Complaint: Annual Exam Natalie Gibbs is a 83 y.o. female who presents today for her Complete Annual Exam. She feels fairly well. She reports exercising - some. She reports she is sleeping well. Breast complaints - none.  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  Health Maintenance  Topic Date Due   COVID-19 Vaccine (3 - Pfizer risk series) 03/02/2023*   Flu Shot  04/04/2023*   Zoster (Shingles) Vaccine (1 of 2) 05/14/2023*   Mammogram  06/14/2023   Medicare Annual Wellness Visit  06/30/2023   DTaP/Tdap/Td vaccine (2 - Td or Tdap) 03/09/2025   Pneumonia Vaccine  Completed   DEXA scan (bone density measurement)  Completed   HPV Vaccine  Aged Out   Colon Cancer Screening  Discontinued   Hepatitis C Screening  Discontinued  *Topic was postponed. The date shown is not the original due date.    Hypertension This is a chronic problem. The problem is controlled. Pertinent negatives include no chest pain, headaches, palpitations or shortness of breath. Past treatments include angiotensin blockers.    Review of Systems  Constitutional:  Negative for fatigue and unexpected weight change.  HENT:  Negative for nosebleeds.   Eyes:  Negative for visual disturbance.  Respiratory:  Negative for cough, chest tightness, shortness of breath and wheezing.   Cardiovascular:  Negative for chest pain, palpitations and leg swelling.  Gastrointestinal:  Negative for abdominal pain, constipation and diarrhea.  Neurological:  Negative for dizziness, weakness, light-headedness and headaches.     Lab Results  Component Value Date   NA 139 04/13/2021   K 4.5 04/13/2021   CO2 25 04/13/2021   GLUCOSE 119 (H) 04/13/2021   BUN 13 04/13/2021   CREATININE 0.73 04/13/2021   CALCIUM 9.3 04/13/2021   EGFR 84 04/13/2021   GFRNONAA 72 04/05/2019    Lab Results  Component Value Date   CHOL 141 04/13/2021   HDL 37 (L) 04/13/2021   LDLCALC 89 04/13/2021   TRIG 74 04/13/2021   CHOLHDL 3.8 04/13/2021   Lab Results  Component Value Date   TSH 1.640 04/13/2021   Lab Results  Component Value Date   HGBA1C 5.1 10/27/2021   Lab Results  Component Value Date   WBC 4.5 04/13/2021   HGB 12.0 04/13/2021   HCT 37.7 04/13/2021   MCV 90 04/13/2021   PLT 249 04/13/2021   Lab Results  Component Value Date   ALT 18 04/13/2021   AST 19 04/13/2021   ALKPHOS 91 04/13/2021   BILITOT 0.3 04/13/2021   Lab Results  Component Value Date   VD25OH 32.5 04/13/2021     Patient Active Problem List   Diagnosis Date Noted   Essential hypertension 10/27/2021   Osteoarthritis of knees, bilateral 08/20/2021   Dyslipidemia 06/02/2021   Urine test positive for microalbuminuria 10/08/2020   Osteopenia determined by x-ray 04/05/2019   Abnormal mammogram of right breast 04/19/2017   Intraductal papilloma of breast 10/27/2016   Endometrial hyperplasia 06/14/2016   Prediabetes 05/15/2014   Allergy 05/15/2014   Fibrocystic breast 05/15/2014   Glaucoma 05/15/2014    Allergies  Allergen Reactions   Iodine Itching   Shellfish Allergy Itching   Codeine Rash    Past Surgical History:  Procedure Laterality Date   BREAST BIOPSY  Left 20 + yrs ago   benign   BREAST CYST ASPIRATION Left 2015   neg   BREAST DUCTAL SYSTEM EXCISION Left 10/26/2016   Intraductal papilloma Surgeon: Jerlean Mood, MD;  Location: [facility] ORS;  Service: General;  Laterality: Left;   CATARACT EXTRACTION, BILATERAL Bilateral 2017   COLONOSCOPY  2010   Dr. Janine Melbourne, [facility]   COLONOSCOPY N/A 05/13/2014   Procedure: COLONOSCOPY;  Surgeon: Stephens Eis, MD;  Location: [facility] ENDOSCOPY;  Service: Gastroenterology;  Laterality: N/A;   COLONOSCOPY  04/28/2016   Dr. Sankar.   COLONOSCOPY WITH PROPOFOL  N/A 10/11/2017   Procedure: COLONOSCOPY WITH PROPOFOL ;  Surgeon: Toledo, Alphonsus Jeans, MD;  Location: [facility] ENDOSCOPY;  Service: Gastroenterology;  Laterality: N/A;   excision mass abdominal wall  1997   EYE SURGERY  2017   bilateral   TUBAL LIGATION      Social History   Tobacco Use   Smoking status: Never   Smokeless tobacco: Never   Tobacco comments:    smoking cessation materials not required  Vaping Use   Vaping status: Never Used  Substance Use Topics   Alcohol use: No    Alcohol/week: 0.0 standard drinks of alcohol   Drug use: No     Medication list has been reviewed and updated.  Current Meds  Medication Sig   Alcohol Swabs (B-D SINGLE USE SWABS REGULAR) PADS USE AS DIRECTED   aspirin 81 MG tablet Take 81 mg by mouth daily.   blood glucose meter kit and supplies Dispense based on patient and insurance preference. Use up to four times daily as directed. (FOR ICD-10 E10.9, E11.9).   brimonidine (ALPHAGAN) 0.2 % ophthalmic solution Place 1 drop into both eyes 2 (two) times daily.    calcium-vitamin D  (OSCAL WITH D) 500-200 MG-UNIT tablet Take 1 tablet by mouth.   dorzolamide-timolol (COSOPT) 22.3-6.8 MG/ML ophthalmic solution Place 1 drop into both eyes 2 (two) times daily.   glucose blood (ACCU-CHEK AVIVA) test strip Test blood sugar once daily   meloxicam (MOBIC) 15 MG tablet Mobic 15 mg tablet  Take 1 tablet(s) every day by oral route.   methocarbamol (ROBAXIN) 500 MG tablet Take 500 mg by mouth 3 (three) times daily.   [DISCONTINUED] losartan  (COZAAR ) 25 MG tablet TAKE 1 TABLET EVERY DAY (NEED MD APPOINTMENT FOR REFILLS)   [DISCONTINUED] traMADol  (ULTRAM ) 50 MG tablet Take 50 mg by mouth every 6 (six) hours as needed.       03/22/2022   11:16 AM 02/03/2022    1:14 PM 04/13/2021    8:42 AM 02/06/2021    3:55 PM  GAD 7 : Generalized Anxiety Score  Nervous, Anxious, on Edge 0 0 0 0  Control/stop worrying 0 0 0 0  Worry too much - different things 0 0 0 0  Trouble relaxing 0 0 0 0  Restless 0 0 0 0  Easily annoyed or irritable 0 0 0 0  Afraid -  awful might happen 0 0 0 0  Total GAD 7 Score 0 0 0 0  Anxiety Difficulty Not difficult at all Not difficult at all Not difficult at all        06/30/2022    9:37 AM 03/22/2022   11:16 AM 02/03/2022    1:14 PM  Depression screen PHQ 2/9  Decreased Interest 0 0 0  Down, Depressed, Hopeless 0 0 0  PHQ - 2 Score 0 0 0  Altered sleeping 0 0 0  Tired, decreased energy 0  0 0  Change in appetite 0 0 0  Feeling bad or failure about yourself  0 0 0  Trouble concentrating 0 0 0  Moving slowly or fidgety/restless 0 0 0  Suicidal thoughts 0 0 0  PHQ-9 Score 0 0 0  Difficult doing work/chores Not difficult at all Not difficult at all Not difficult at all    BP Readings from Last 3 Encounters:  02/14/23 122/76  03/22/22 122/68  02/03/22 122/60    Physical Exam Vitals and nursing note reviewed.  Constitutional:      General: She is not in acute distress.    Appearance: She is well-developed.  HENT:     Head: Normocephalic and atraumatic.     Right Ear: Tympanic membrane and ear canal normal.     Left Ear: Tympanic membrane and ear canal normal.     Nose:     Right Sinus: No maxillary sinus tenderness.     Left Sinus: No maxillary sinus tenderness.  Eyes:     General: No scleral icterus.       Right eye: No discharge.        Left eye: No discharge.     Conjunctiva/sclera: Conjunctivae normal.  Neck:     Thyroid : No thyromegaly.     Vascular: No carotid bruit.  Cardiovascular:     Rate and Rhythm: Normal rate and regular rhythm.     Pulses: Normal pulses.     Heart sounds: Normal heart sounds.  Pulmonary:     Effort: Pulmonary effort is normal. No respiratory distress.     Breath sounds: No wheezing.  Abdominal:     General: Bowel sounds are normal.     Palpations: Abdomen is soft.     Tenderness: There is no abdominal tenderness.  Musculoskeletal:     Cervical back: Normal range of motion. No erythema.     Right lower leg: No edema.     Left lower leg: No edema.   Lymphadenopathy:     Cervical: No cervical adenopathy.  Skin:    General: Skin is warm and dry.     Findings: No rash.  Neurological:     Mental Status: She is alert and oriented to person, place, and time.     Cranial Nerves: No cranial nerve deficit.     Sensory: No sensory deficit.     Deep Tendon Reflexes: Reflexes are normal and symmetric.  Psychiatric:        Attention and Perception: Attention normal.        Mood and Affect: Mood normal.     Wt Readings from Last 3 Encounters:  02/14/23 161 lb 3.2 oz (73.1 kg)  06/30/22 169 lb (76.7 kg)  03/22/22 169 lb (76.7 kg)    BP 122/76   Pulse 72   Ht 5\' 4"  (1.626 m)   Wt 161 lb 3.2 oz (73.1 kg)   SpO2 97%   BMI 27.67 kg/m   Assessment and Plan:  Problem List Items Addressed This Visit       Unprioritized   Dyslipidemia (Chronic)   Managed with diet changes      Relevant Orders   Lipid panel   Essential hypertension   Controlled BP with normal exam. Current regimen is losartan . Will continue same medications; encourage continued reduced sodium diet.       Relevant Medications   losartan  (COZAAR ) 25 MG tablet   Other Relevant Orders   TSH   Comprehensive metabolic panel   CBC  with Differential/Platelet   Prediabetes (Chronic)   Controlled with diet only.      Relevant Orders   Hemoglobin A1c   Comprehensive metabolic panel   Urine test positive for microalbuminuria   Relevant Medications   losartan  (COZAAR ) 25 MG tablet   Other Visit Diagnoses       Annual physical exam    -  Primary     Encounter for screening mammogram for breast cancer       Relevant Orders   MM 3D SCREENING MAMMOGRAM BILATERAL BREAST       Return in about 6 months (around 08/14/2023) for HTN.    Sheron Dixons, MD [facility] Medicine [place]

## 2023-02-14 NOTE — Patient Instructions (Signed)
 Call [facility] to schedule your mammogram at [phone].

## 2023-02-14 NOTE — Assessment & Plan Note (Signed)
 Controlled with diet only.

## 2023-02-15 LAB — LIPID PANEL
Chol/HDL Ratio: 3.3 {ratio} (ref 0.0–4.4)
Cholesterol, Total: 166 mg/dL (ref 100–199)
HDL: 51 mg/dL (ref >39)
LDL Chol Calc (NIH): 100 mg/dL — ABNORMAL HIGH (ref 0–99)
Triglycerides: 80 mg/dL (ref 0–149)
VLDL Cholesterol Cal: 15 mg/dL (ref 5–40)

## 2023-02-15 LAB — COMPREHENSIVE METABOLIC PANEL
ALT: 14 [IU]/L (ref 0–32)
AST: 16 [IU]/L (ref 0–40)
Albumin: 4.2 g/dL (ref 3.8–4.8)
Alkaline Phosphatase: 114 [IU]/L (ref 44–121)
BUN/Creatinine Ratio: 15 (ref 12–28)
BUN: 11 mg/dL (ref 8–27)
Bilirubin Total: 0.4 mg/dL (ref 0.0–1.2)
CO2: 24 mmol/L (ref 20–29)
Calcium: 9.9 mg/dL (ref 8.7–10.3)
Chloride: 104 mmol/L (ref 96–106)
Creatinine, Ser: 0.73 mg/dL (ref 0.57–1.00)
Globulin, Total: 3 g/dL (ref 1.5–4.5)
Glucose: 89 mg/dL (ref 70–99)
Potassium: 4.6 mmol/L (ref 3.5–5.2)
Sodium: 140 mmol/L (ref 134–144)
Total Protein: 7.2 g/dL (ref 6.0–8.5)
eGFR: 83 mL/min/{1.73_m2} (ref >59)

## 2023-02-15 LAB — CBC WITH DIFFERENTIAL/PLATELET
Basophils Absolute: 0 10*3/uL (ref 0.0–0.2)
Basos: 1 %
EOS (ABSOLUTE): 0.1 10*3/uL (ref 0.0–0.4)
Eos: 2 %
Hematocrit: 36.7 % (ref 34.0–46.6)
Hemoglobin: 11.4 g/dL (ref 11.1–15.9)
Immature Grans (Abs): 0 10*3/uL (ref 0.0–0.1)
Immature Granulocytes: 0 %
Lymphocytes Absolute: 1.7 10*3/uL (ref 0.7–3.1)
Lymphs: 34 %
MCH: 28.7 pg (ref 26.6–33.0)
MCHC: 31.1 g/dL — ABNORMAL LOW (ref 31.5–35.7)
MCV: 92 fL (ref 79–97)
Monocytes Absolute: 0.3 10*3/uL (ref 0.1–0.9)
Monocytes: 6 %
Neutrophils Absolute: 2.7 10*3/uL (ref 1.4–7.0)
Neutrophils: 57 %
Platelets: 250 10*3/uL (ref 150–450)
RBC: 3.97 x10E6/uL (ref 3.77–5.28)
RDW: 13 % (ref 11.7–15.4)
WBC: 4.8 10*3/uL (ref 3.4–10.8)

## 2023-02-15 LAB — TSH: TSH: 2.1 u[IU]/mL (ref 0.450–4.500)

## 2023-02-15 LAB — HEMOGLOBIN A1C
Est. average glucose Bld gHb Est-mCnc: 103 mg/dL
Hgb A1c MFr Bld: 5.2 % (ref 4.8–5.6)

## 2023-06-20 ENCOUNTER — Ambulatory Visit

## 2023-07-04 ENCOUNTER — Ambulatory Visit
Admission: RE | Admit: 2023-07-04 | Discharge: 2023-07-04 | Disposition: A | Discharge status: Home / Self Care | Source: Ambulatory Visit | Attending: Internal Medicine | Admitting: Internal Medicine

## 2023-07-04 DIAGNOSIS — Z1231 Encounter for screening mammogram for malignant neoplasm of breast: Secondary | ICD-10-CM | POA: Diagnosis present

## 2023-07-06 ENCOUNTER — Ambulatory Visit: Payer: Medicare HMO | Admitting: Emergency Medicine

## 2023-07-06 VITALS — Ht 64.0 in | Wt 165.0 lb

## 2023-07-06 DIAGNOSIS — Z0000 Encounter for general adult medical examination without abnormal findings: Secondary | ICD-10-CM

## 2023-07-06 NOTE — Patient Instructions (Signed)
 Natalie Gibbs , Thank you for taking time out of your busy schedule to complete your Annual Wellness Visit with me. I enjoyed our conversation and look forward to speaking with you again next year. I, as well as your care team,  appreciate your ongoing commitment to your health goals. Please review the following plan we discussed and let me know if I can assist you in the future. Your Game plan/ To Do List    Referrals:  None  Follow up Visits: Next Medicare AWV with our clinical staff: 07/11/24 @ 8:10am (PHONE VISIT)   Have you seen your provider in the last 6 months (3 months if uncontrolled diabetes)? Yes Next Office Visit with your provider: 09/19/23 @ 9:40am with Dr. Justus  Clinician Recommendations:  Aim for 30 minutes of exercise or brisk walking, 6-8 glasses of water, and 5 servings of fruits and vegetables each day.       This is a list of the screening recommended for you and due dates:  Health Maintenance  Topic Date Due   Zoster (Shingles) Vaccine (1 of 2) Never done   COVID-19 Vaccine (3 - Pfizer risk series) 03/16/2019   Mammogram  06/14/2023   Flu Shot  08/05/2023   Medicare Annual Wellness Visit  07/05/2024   DTaP/Tdap/Td vaccine (2 - Td or Tdap) 03/09/2025   DEXA scan (bone density measurement)  06/10/2026   Pneumococcal Vaccine for age over 62  Completed   Hepatitis B Vaccine  Aged Out   HPV Vaccine  Aged Out   Meningitis B Vaccine  Aged Out   Colon Cancer Screening  Discontinued   Hepatitis C Screening  Discontinued    Advanced directives: (Copy Requested) Please bring a copy of your health care power of attorney and living will to the office to be added to your chart at your convenience. You can mail to [facility] [address] [place], [place] [address] or email to [email or web address] Advance Care Planning is important because it:  [x]  Makes sure you receive the medical care that is consistent with your values, goals, and  preferences  [x]  It provides guidance to your family and loved ones and reduces their decisional burden about whether or not they are making the right decisions based on your wishes.  Follow the link provided in your after visit summary or read over the paperwork we have mailed to you to help you started getting your Advance Directives in place. If you need assistance in completing these, please reach out to us  so that we can help you!  See attachments for Preventive Care and Fall Prevention Tips.   Fall Prevention in the Home, Adult Falls can cause injuries and affect people of all ages. There are many simple things that you can do to make your home safe and to help prevent falls. If you need it, ask for help making these changes. What actions can I take to prevent falls? General information Use good lighting in all rooms. Make sure to: Replace any light bulbs that burn out. Turn on lights if it is dark and use night-lights. Keep items that you use often in easy-to-reach places. Lower the shelves around your home if needed. Move furniture so that there are clear paths around it. Do not keep throw rugs or other things on the floor that can make you trip. If any of your floors are uneven, fix them. Add color or contrast paint or tape to clearly mark and help you  see: Grab bars or handrails. First and last steps of staircases. Where the edge of each step is. If you use a ladder or stepladder: Make sure that it is fully opened. Do not climb a closed ladder. Make sure the sides of the ladder are locked in place. Have someone hold the ladder while you use it. Know where your pets are as you move through your home. What can I do in the bathroom?     Keep the floor dry. Clean up any water that is on the floor right away. Remove soap buildup in the bathtub or shower. Buildup makes bathtubs and showers slippery. Use non-skid mats or decals on the floor of the bathtub or shower. Attach bath  mats securely with double-sided, non-slip rug tape. If you need to sit down while you are in the shower, use a non-slip stool. Install grab bars by the toilet and in the bathtub and shower. Do not use towel bars as grab bars. What can I do in the bedroom? Make sure that you have a light by your bed that is easy to reach. Do not use any sheets or blankets on your bed that hang to the floor. Have a firm bench or chair with side arms that you can use for support when you get dressed. What can I do in the kitchen? Clean up any spills right away. If you need to reach something above you, use a sturdy step stool that has a grab bar. Keep electrical cables out of the way. Do not use floor polish or wax that makes floors slippery. What can I do with my stairs? Do not leave anything on the stairs. Make sure that you have a light switch at the top and the bottom of the stairs. Have them installed if you do not have them. Make sure that there are handrails on both sides of the stairs. Fix handrails that are broken or loose. Make sure that handrails are as long as the staircases. Install non-slip stair treads on all stairs in your home if they do not have carpet. Avoid having throw rugs at the top or bottom of stairs, or secure the rugs with carpet tape to prevent them from moving. Choose a carpet design that does not hide the edge of steps on the stairs. Make sure that carpet is firmly attached to the stairs. Fix any carpet that is loose or worn. What can I do on the outside of my home? Use bright outdoor lighting. Repair the edges of walkways and driveways and fix any cracks. Clear paths of anything that can make you trip, such as tools or rocks. Add color or contrast paint or tape to clearly mark and help you see high doorway thresholds. Trim any bushes or trees on the main path into your home. Check that handrails are securely fastened and in good repair. Both sides of all steps should have  handrails. Install guardrails along the edges of any raised decks or porches. Have leaves, snow, and ice cleared regularly. Use sand, salt, or ice melt on walkways during winter months if you live where there is ice and snow. In the garage, clean up any spills right away, including grease or oil spills. What other actions can I take? Review your medicines with your health care provider. Some medicines can make you confused or feel dizzy. This can increase your chance of falling. Wear closed-toe shoes that fit well and support your feet. Wear shoes that have rubber soles and  low heels. Use a cane, walker, scooter, or crutches that help you move around if needed. Talk with your provider about other ways that you can decrease your risk of falls. This may include seeing a physical therapist to learn to do exercises to improve movement and strength. Where to find more information Centers for Disease Control and Prevention, STEADI: TonerPromos.no [employer] on Aging: BaseRingTones.pl National Institute on Aging: BaseRingTones.pl Contact a health care provider if: You are afraid of falling at home. You feel weak, drowsy, or dizzy at home. You fall at home. Get help right away if you: Lose consciousness or have trouble moving after a fall. Have a fall that causes a head injury. These symptoms may be an emergency. Get help right away. Call 911. Do not wait to see if the symptoms will go away. Do not drive yourself to the hospital. This information is not intended to replace advice given to you by your health care provider. Make sure you discuss any questions you have with your health care provider. Document Revised: 08/24/2021 Document Reviewed: 08/24/2021 Elsevier Patient Education  2024 [employer].

## 2023-07-06 NOTE — Progress Notes (Signed)
 Subjective:   Natalie Gibbs is a 83 y.o. who presents for a Medicare Wellness preventive visit.  As a reminder, Annual Wellness Visits don't include a physical exam, and some assessments may be limited, especially if this visit is performed virtually. We may recommend an in-person follow-up visit with your provider if needed.  Visit Complete: Virtual I connected with  Natalie Gibbs on 07/06/23 by a audio enabled telemedicine application and verified that I am speaking with the correct person using two identifiers.  Patient Location: Home  Provider Location: Home Office  I discussed the limitations of evaluation and management by telemedicine. The patient expressed understanding and agreed to proceed.  Vital Signs: Because this visit was a virtual/telehealth visit, some criteria may be missing or patient reported. Any vitals not documented were not able to be obtained and vitals that have been documented are patient reported.  VideoDeclined- This patient declined Librarian, academic. Therefore the visit was completed with audio only.  Persons Participating in Visit: Patient.  AWV Questionnaire: No: Patient Medicare AWV questionnaire was not completed prior to this visit.  Cardiac Risk Factors include: advanced age (>66men, >82 women);dyslipidemia;hypertension;Other (see comment), Risk factor comments: prediabetic     Objective:    Today's Vitals   07/06/23 0847  Weight: 165 lb (74.8 kg)  Height: 5' 4 (1.626 m)   Body mass index is 28.32 kg/m.     07/06/2023    9:00 AM 06/30/2022    9:39 AM 06/22/2021    8:18 AM 04/23/2020    9:36 AM 04/23/2019    9:31 AM 03/29/2018   11:25 AM 10/11/2017   10:50 AM  Advanced Directives  Does Patient Have a Medical Advance Directive? Yes No Yes Yes No Yes  Yes   Type of Estate agent of [place];Living will  Healthcare Power of [place];Living will Healthcare Power of  [place];Living will  Living will;Healthcare Power of Attorney Living will  Does patient want to make changes to medical advance directive? No - Patient declined        Copy of Healthcare Power of Attorney in Chart? No - copy requested  No - copy requested No - copy requested  No - copy requested    Would patient like information on creating a medical advance directive?  No - Patient declined   Yes (MAU/Ambulatory/Procedural Areas - Information given)       Data saved with a previous flowsheet row definition    Current Medications (verified) Outpatient Encounter Medications as of 07/06/2023  Medication Sig   acetaminophen (TYLENOL) 650 MG CR tablet Take 1,300 mg by mouth every 8 (eight) hours as needed for pain.   aspirin 81 MG tablet Take 81 mg by mouth daily.   brimonidine (ALPHAGAN) 0.2 % ophthalmic solution Place 1 drop into both eyes 2 (two) times daily.    calcium-vitamin D  (OSCAL WITH D) 500-200 MG-UNIT tablet Take 1 tablet by mouth.   dorzolamide-timolol (COSOPT) 22.3-6.8 MG/ML ophthalmic solution Place 1 drop into both eyes 2 (two) times daily.   losartan  (COZAAR ) 25 MG tablet Take 1 tablet (25 mg total) by mouth daily.   meloxicam (MOBIC) 15 MG tablet Mobic 15 mg tablet  Take 1 tablet(s) every day by oral route.   methocarbamol (ROBAXIN) 500 MG tablet Take 500 mg by mouth 3 (three) times daily.   Alcohol Swabs (B-D SINGLE USE SWABS REGULAR) PADS USE AS DIRECTED (Patient not taking: Reported on 07/06/2023)   blood  glucose meter kit and supplies Dispense based on patient and insurance preference. Use up to four times daily as directed. (FOR ICD-10 E10.9, E11.9). (Patient not taking: Reported on 07/06/2023)   glucose blood (ACCU-CHEK AVIVA) test strip Test blood sugar once daily (Patient not taking: Reported on 07/06/2023)   No facility-administered encounter medications on file as of 07/06/2023.    Allergies (verified) Iodine, Shellfish allergy, and Codeine   History: Past Medical  History:  Diagnosis Date   Allergy    Arthritis    Diffuse cystic mastopathy    Glaucoma    Hypertension    Obesity, unspecified    Pre-diabetes    Special screening for malignant neoplasms, colon    Past Surgical History:  Procedure Laterality Date   BREAST BIOPSY Left 20 + yrs ago   benign   BREAST CYST ASPIRATION Left 2015   neg   BREAST DUCTAL SYSTEM EXCISION Left 10/26/2016   Intraductal papilloma Surgeon: Dellie Louanne MATSU, MD;  Location: [facility] ORS;  Service: General;  Laterality: Left;   CATARACT EXTRACTION, BILATERAL Bilateral 2017   COLONOSCOPY  2010   Dr. Ora, [facility]   COLONOSCOPY N/A 05/13/2014   Procedure: COLONOSCOPY;  Surgeon: Deward CINDERELLA Ora, MD;  [facility];  Service: Gastroenterology;  Laterality: N/A;   COLONOSCOPY  04/28/2016   Dr. Sankar.   COLONOSCOPY WITH PROPOFOL  N/A 10/11/2017   Procedure: COLONOSCOPY WITH PROPOFOL ;  Surgeon: Toledo, Ladell POUR, MD;  Location: [facility] ENDOSCOPY;  Service: Gastroenterology;  Laterality: N/A;   excision mass abdominal wall  1997   EYE SURGERY  2017   bilateral   TUBAL LIGATION     Family History  Problem Relation Age of Onset   Healthy Mother    Emphysema Father    Breast cancer Other        niece/breast   Diabetes Sister    Diabetes Sister    Breast cancer Other        niece   Kidney failure Brother    Multiple sclerosis Daughter    Social History   Socioeconomic History   Marital status: Widowed    Spouse name: Not on file   Number of children: 4   Years of education: Not on file   Highest education level: 10th grade  Occupational History   Occupation: Retired    Associate Professor: OTHER  Tobacco Use   Smoking status: Never   Smokeless tobacco: Never   Tobacco comments:    smoking cessation materials not required  Vaping Use   Vaping status: Never Used  Substance and Sexual Activity   Alcohol use: No    Alcohol/week: 0.0 standard drinks of alcohol   Drug use: No   Sexual activity: Never  Other  Topics Concern   Not on file  Social History Narrative   Daughter passed 05/31/23, 3 boys living   Social Drivers of Corporate investment banker Strain: Low Risk  (07/06/2023)   Overall Financial Resource Strain (CARDIA)    Difficulty of Paying Living Expenses: Not hard at all  Food Insecurity: No Food Insecurity (07/06/2023)   Hunger Vital Sign    Worried About Running Out of Food in the Last Year: Never true    Ran Out of Food in the Last Year: Never true  Transportation Needs: No Transportation Needs (07/06/2023)   PRAPARE - Administrator, Civil Service (Medical): No    Lack of Transportation (Non-Medical): No  Physical Activity: Insufficiently Active (07/06/2023)   Exercise Vital  Sign    Days of Exercise per Week: 5 days    Minutes of Exercise per Session: 20 min  Stress: No Stress Concern Present (07/06/2023)   [employer] of Occupational Health - Occupational Stress Questionnaire    Feeling of Stress: Not at all  Social Connections: Moderately Isolated (07/06/2023)   Social Connection and Isolation Panel    Frequency of Communication with Friends and Family: More than three times a week    Frequency of Social Gatherings with Friends and Family: More than three times a week    Attends Religious Services: More than 4 times per year    Active Member of [employer] or Organizations: No    Attends Banker Meetings: Never    Marital Status: Widowed    Tobacco Counseling Counseling given: Not Answered Tobacco comments: smoking cessation materials not required    Clinical Intake:  Pre-visit preparation completed: Yes  Pain : No/denies pain     BMI - recorded: 28.32 Nutritional Status: BMI 25 -29 Overweight Nutritional Risks: None Diabetes: No  Lab Results  Component Value Date   HGBA1C 5.2 02/14/2023   HGBA1C 5.1 10/27/2021   HGBA1C 5.9 (H) 04/13/2021     How often do you need to have someone help you when you read instructions, pamphlets, or  other written materials from your doctor or pharmacy?: 1 - Never  Interpreter Needed?: No  Information entered by :: Vina Ned, CMA   Activities of Daily Living     07/06/2023    8:50 AM  In your present state of health, do you have any difficulty performing the following activities:  Hearing? 1  Comment hearing aids  Vision? 0  Difficulty concentrating or making decisions? 0  Walking or climbing stairs? 1  Comment uses a cane  Dressing or bathing? 0  Doing errands, shopping? 0  Preparing Food and eating ? N  Using the Toilet? N  In the past six months, have you accidently leaked urine? N  Do you have problems with loss of bowel control? N  Managing your Medications? N  Managing your Finances? N  Housekeeping or managing your Housekeeping? N    Patient Care Team: Justus Leita DEL, MD as PCP - General (Internal Medicine) Carolee Manus DASEN., MD (Ophthalmology)  I have updated your Care Teams any recent Medical Services you may have received from other providers in the past year.     Assessment:   This is a routine wellness examination for Starla Deller.  Hearing/Vision screen Hearing Screening - Comments:: Wears hearing aids Vision Screening - Comments:: Gets routine eye exams, Dr. Manus Carolee, [place]   Goals Addressed             This Visit's Progress    Patient Stated       Would like health to get better and better       Depression Screen     07/06/2023    8:56 AM 06/30/2022    9:37 AM 03/22/2022   11:16 AM 02/03/2022    1:14 PM 06/22/2021    8:17 AM 04/13/2021    8:42 AM 02/06/2021    3:55 PM  PHQ 2/9 Scores  PHQ - 2 Score 0 0 0 0 0 0 0  PHQ- 9 Score 1 0 0 0  0 0    Fall Risk     07/06/2023    9:01 AM 06/30/2022    9:39 AM 03/22/2022   11:16 AM 02/03/2022  1:14 PM 06/22/2021    8:19 AM  Fall Risk   Falls in the past year? 0 0 0 0 0  Number falls in past yr: 0 0 0 0 0  Injury with Fall? 0 0 0 0 0  Risk for fall due to : History of  fall(s);Impaired balance/gait;Orthopedic patient;Impaired mobility No Fall Risks History of fall(s) No Fall Risks No Fall Risks  Follow up Falls evaluation completed;Education provided Falls prevention discussed;Falls evaluation completed Falls evaluation completed Falls evaluation completed Falls prevention discussed      Data saved with a previous flowsheet row definition    MEDICARE RISK AT HOME:  Medicare Risk at Home Any stairs in or around the home?: Yes (also has a ramp) If so, are there any without handrails?: No Home free of loose throw rugs in walkways, pet beds, electrical cords, etc?: Yes Adequate lighting in your home to reduce risk of falls?: Yes Life alert?: No Use of a cane, walker or w/c?: Yes (cane) Grab bars in the bathroom?: Yes Shower chair or bench in shower?: Yes Elevated toilet seat or a handicapped toilet?: Yes  TIMED UP AND GO:  Was the test performed?  No  Cognitive Function: 6CIT completed        07/06/2023    9:02 AM 06/30/2022    9:40 AM 04/23/2019    9:34 AM 03/29/2018   11:29 AM 03/23/2017    8:30 AM  6CIT Screen  What Year? 0 points 0 points 0 points 0 points 0 points  What month? 0 points 0 points 0 points 0 points 0 points  What time? 0 points 0 points 0 points 0 points 3 points  Count back from 20 0 points 0 points 0 points 0 points 0 points  Months in reverse 0 points 0 points 4 points 4 points 0 points  Repeat phrase 0 points 0 points 6 points 2 points 8 points  Total Score 0 points 0 points 10 points 6 points 11 points    Immunizations Immunization History  Administered Date(s) Administered   Fluad Quad(high Dose 65+) 10/22/2019, 10/08/2020, 02/06/2021, 10/27/2021   Influenza,inj,Quad PF,6+ Mos 09/10/2015, 03/23/2017   PFIZER(Purple Top)SARS-COV-2 Vaccination 01/31/2019, 02/16/2019   Pneumococcal Conjugate-13 03/06/2014   Pneumococcal Polysaccharide-23 03/06/2008   Tdap 03/10/2015    Screening Tests Health Maintenance  Topic Date  Due   Zoster Vaccines- Shingrix (1 of 2) Never done   COVID-19 Vaccine (3 - Pfizer risk series) 03/16/2019   MAMMOGRAM  06/14/2023   INFLUENZA VACCINE  08/05/2023   Medicare Annual Wellness (AWV)  07/05/2024   DTaP/Tdap/Td (2 - Td or Tdap) 03/09/2025   DEXA SCAN  06/10/2026   Pneumococcal Vaccine: 50+ Years  Completed   Hepatitis B Vaccines  Aged Out   HPV VACCINES  Aged Out   Meningococcal B Vaccine  Aged Out   Colonoscopy  Discontinued   Hepatitis C Screening  Discontinued    Health Maintenance  Health Maintenance Due  Topic Date Due   Zoster Vaccines- Shingrix (1 of 2) Never done   COVID-19 Vaccine (3 - Pfizer risk series) 03/16/2019   MAMMOGRAM  06/14/2023   Health Maintenance Items Addressed: See Nurse Notes at the end of this note  Additional Screening:  Vision Screening: Recommended annual ophthalmology exams for early detection of glaucoma and other disorders of the eye. Would you like a referral to an eye doctor? No    Dental Screening: Recommended annual dental exams for proper oral hygiene  [employer]  Referral / Chronic Care Management: CRR required this visit?  No   CCM required this visit?  No   Plan:    I have personally reviewed and noted the following in the patient's chart:   Medical and social history Use of alcohol, tobacco or illicit drugs  Current medications and supplements including opioid prescriptions. Patient is not currently taking opioid prescriptions. Functional ability and status Nutritional status Physical activity Advanced directives List of other physicians Hospitalizations, surgeries, and ER visits in previous 12 months Vitals Screenings to include cognitive, depression, and falls Referrals and appointments  In addition, I have reviewed and discussed with patient certain preventive protocols, quality metrics, and best practice recommendations. A written personalized care plan for preventive services as well as general  preventive health recommendations were provided to patient.   Vina Ned, CMA   07/06/2023   After Visit Summary: (Mail) Due to this being a telephonic visit, the after visit summary with patients personalized plan was offered to patient via mail   Notes:  Had [facility] 07/04/23 Screening colonoscopy no longer recommended due to age Declined Covid and Shingles vaccines

## 2023-08-08 ENCOUNTER — Ambulatory Visit: Payer: Medicare HMO | Admitting: Internal Medicine

## 2023-09-13 ENCOUNTER — Encounter: Payer: Self-pay | Admitting: Internal Medicine

## 2023-09-13 ENCOUNTER — Ambulatory Visit (INDEPENDENT_AMBULATORY_CARE_PROVIDER_SITE_OTHER): Admitting: Internal Medicine

## 2023-09-13 VITALS — BP 122/72 | HR 60 | Ht 64.0 in | Wt 161.0 lb

## 2023-09-13 DIAGNOSIS — M1611 Unilateral primary osteoarthritis, right hip: Secondary | ICD-10-CM | POA: Diagnosis not present

## 2023-09-13 DIAGNOSIS — Z23 Encounter for immunization: Secondary | ICD-10-CM | POA: Diagnosis not present

## 2023-09-13 DIAGNOSIS — I10 Essential (primary) hypertension: Secondary | ICD-10-CM

## 2023-09-13 NOTE — Assessment & Plan Note (Signed)
 Blood pressure is well controlled on losartan . No medication side effects noted. Plan to continue current medications.

## 2023-09-13 NOTE — Progress Notes (Signed)
 Date:  09/13/2023   Name:  Natalie Gibbs   DOB:  October 08, 1940   MRN:  [id number]   Chief Complaint: Hypertension  Hypertension This is a chronic problem. The problem is controlled. Pertinent negatives include no chest pain, headaches, palpitations or shortness of breath. Past treatments include angiotensin blockers.  She is a bit lonely since her daughter passed away in 05/29/2023 after a long battle with MS.  Her husband passed away in Sep 28, 2021 but she tries to stay busy with church and other family.  Review of Systems  Constitutional:  Negative for fatigue and unexpected weight change.  HENT:  Positive for hearing loss. Negative for trouble swallowing.   Eyes:  Negative for visual disturbance.  Respiratory:  Negative for cough, chest tightness, shortness of breath and wheezing.   Cardiovascular:  Negative for chest pain, palpitations and leg swelling.  Gastrointestinal:  Negative for abdominal pain, constipation and diarrhea.  Musculoskeletal:  Negative for arthralgias and myalgias.  Neurological:  Negative for dizziness, weakness, light-headedness and headaches.  Psychiatric/Behavioral:  Negative for dysphoric mood. The patient is not nervous/anxious.      Lab Results  Component Value Date   NA 140 02/14/2023   K 4.6 02/14/2023   CO2 24 02/14/2023   GLUCOSE 89 02/14/2023   BUN 11 02/14/2023   CREATININE 0.73 02/14/2023   CALCIUM 9.9 02/14/2023   EGFR 83 02/14/2023   GFRNONAA 72 04/05/2019   Lab Results  Component Value Date   CHOL 166 02/14/2023   HDL 51 02/14/2023   LDLCALC 100 (H) 02/14/2023   TRIG 80 02/14/2023   CHOLHDL 3.3 02/14/2023   Lab Results  Component Value Date   TSH 2.100 02/14/2023   Lab Results  Component Value Date   HGBA1C 5.2 02/14/2023   Lab Results  Component Value Date   WBC 4.8 02/14/2023   HGB 11.4 02/14/2023   HCT 36.7 02/14/2023   MCV 92 02/14/2023   PLT 250 02/14/2023   Lab Results  Component Value Date   ALT 14 02/14/2023    AST 16 02/14/2023   ALKPHOS 114 02/14/2023   BILITOT 0.4 02/14/2023   Lab Results  Component Value Date   VD25OH 32.5 04/13/2021     Patient Active Problem List   Diagnosis Date Noted   Primary osteoarthritis of right hip 09/13/2023   Essential hypertension 10/27/2021   Osteoarthritis of knees, bilateral 08/20/2021   Dyslipidemia 06/02/2021   Urine test positive for microalbuminuria 10/08/2020   Osteopenia determined by x-ray 04/05/2019   Abnormal mammogram of right breast 04/19/2017   Intraductal papilloma of breast 10/27/2016   Endometrial hyperplasia 06/14/2016   Prediabetes 05/15/2014   Allergy 05/15/2014   Fibrocystic breast 05/15/2014   Glaucoma 05/15/2014    Allergies  Allergen Reactions   Iodine Itching   Shellfish Allergy Itching   Codeine Rash    Past Surgical History:  Procedure Laterality Date   BREAST BIOPSY Left 20 + yrs ago   benign   BREAST CYST ASPIRATION Left 2015   neg   BREAST DUCTAL SYSTEM EXCISION Left 10/26/2016   Intraductal papilloma Surgeon: Dellie Louanne MATSU, MD;  Location: [facility] ORS;  Service: General;  Laterality: Left;   CATARACT EXTRACTION, BILATERAL Bilateral 09/29/15   COLONOSCOPY  September 28, 2008   Dr. Ora, [facility]   COLONOSCOPY N/A 05/13/2014   Procedure: COLONOSCOPY;  Surgeon: Deward CINDERELLA Ora, MD;  Location: [facility] ENDOSCOPY;  Service: Gastroenterology;  Laterality: N/A;   COLONOSCOPY  04/28/2016   Dr.  Sankar.   COLONOSCOPY WITH PROPOFOL  N/A 10/11/2017   Procedure: COLONOSCOPY WITH PROPOFOL ;  Surgeon: Toledo, Ladell POUR, MD;  Location: [facility] ENDOSCOPY;  Service: Gastroenterology;  Laterality: N/A;   excision mass abdominal wall  1997   EYE SURGERY  2017   bilateral   TUBAL LIGATION      Social History   Tobacco Use   Smoking status: Never   Smokeless tobacco: Never   Tobacco comments:    smoking cessation materials not required  Vaping Use   Vaping status: Never Used  Substance Use Topics   Alcohol use: No    Alcohol/week: 0.0 standard drinks  of alcohol   Drug use: No     Medication list has been reviewed and updated.  Current Meds  Medication Sig   acetaminophen (TYLENOL) 650 MG CR tablet Take 1,300 mg by mouth every 8 (eight) hours as needed for pain.   Alcohol Swabs (B-D SINGLE USE SWABS REGULAR) PADS USE AS DIRECTED   aspirin 81 MG tablet Take 81 mg by mouth daily.   blood glucose meter kit and supplies Dispense based on patient and insurance preference. Use up to four times daily as directed. (FOR ICD-10 E10.9, E11.9).   brimonidine (ALPHAGAN) 0.2 % ophthalmic solution Place 1 drop into both eyes 2 (two) times daily.    calcium-vitamin D  (OSCAL WITH D) 500-200 MG-UNIT tablet Take 1 tablet by mouth.   dorzolamide-timolol (COSOPT) 22.3-6.8 MG/ML ophthalmic solution Place 1 drop into both eyes 2 (two) times daily.   glucose blood (ACCU-CHEK AVIVA) test strip Test blood sugar once daily   losartan  (COZAAR ) 25 MG tablet Take 1 tablet (25 mg total) by mouth daily.   meloxicam (MOBIC) 15 MG tablet Mobic 15 mg tablet  Take 1 tablet(s) every day by oral route.   methocarbamol (ROBAXIN) 500 MG tablet Take 500 mg by mouth 3 (three) times daily.       09/13/2023    9:09 AM 03/22/2022   11:16 AM 02/03/2022    1:14 PM 04/13/2021    8:42 AM  GAD 7 : Generalized Anxiety Score  Nervous, Anxious, on Edge 0 0 0 0  Control/stop worrying 0 0 0 0  Worry too much - different things 0 0 0 0  Trouble relaxing 0 0 0 0  Restless 0 0 0 0  Easily annoyed or irritable 0 0 0 0  Afraid - awful might happen 0 0 0 0  Total GAD 7 Score 0 0 0 0  Anxiety Difficulty Not difficult at all Not difficult at all Not difficult at all Not difficult at all       09/13/2023    9:09 AM 07/06/2023    8:56 AM 06/30/2022    9:37 AM  Depression screen PHQ 2/9  Decreased Interest 0 0 0  Down, Depressed, Hopeless 0 0 0  PHQ - 2 Score 0 0 0  Altered sleeping 0 0 0  Tired, decreased energy 0 0 0  Change in appetite 0 1 0  Feeling bad or failure about yourself  0  0 0  Trouble concentrating 0 0 0  Moving slowly or fidgety/restless 0 0 0  Suicidal thoughts 0 0 0  PHQ-9 Score 0 1 0  Difficult doing work/chores Not difficult at all Not difficult at all Not difficult at all    BP Readings from Last 3 Encounters:  09/13/23 122/72  02/14/23 122/76  03/22/22 122/68    Physical Exam Vitals and nursing note reviewed.  Constitutional:      General: She is not in acute distress.    Appearance: She is well-developed.  HENT:     Head: Normocephalic and atraumatic.  Cardiovascular:     Rate and Rhythm: Normal rate and regular rhythm.     Heart sounds: No murmur heard. Pulmonary:     Effort: Pulmonary effort is normal. No respiratory distress.     Breath sounds: No wheezing or rhonchi.  Musculoskeletal:     Cervical back: Normal range of motion.     Right lower leg: No edema.     Left lower leg: No edema.  Lymphadenopathy:     Cervical: No cervical adenopathy.  Skin:    General: Skin is warm and dry.     Findings: No rash.  Neurological:     General: No focal deficit present.     Mental Status: She is alert and oriented to person, place, and time.     Gait: Gait abnormal (uses cane due to hip pain).  Psychiatric:        Mood and Affect: Mood normal.        Behavior: Behavior normal.     Wt Readings from Last 3 Encounters:  09/13/23 161 lb (73 kg)  07/06/23 165 lb (74.8 kg)  02/14/23 161 lb 3.2 oz (73.1 kg)    BP 122/72   Pulse 60   Ht 5' 4 (1.626 m)   Wt 161 lb (73 kg)   SpO2 98%   BMI 27.64 kg/m   Assessment and Plan:  Problem List Items Addressed This Visit       Unprioritized   Essential hypertension - Primary (Chronic)   Blood pressure is well controlled on losartan . No medication side effects noted. Plan to continue current medications.       Primary osteoarthritis of right hip (Chronic)   Followed by Orthopedics She is taking Robaxin and Mobic as needed. No falls or leg weakness.      Other Visit  Diagnoses       Encounter for immunization       Relevant Orders   Flu vaccine HIGH DOSE PF(Fluzone Trivalent) (Completed)       Return in about 6 months (around 03/12/2024) for [facility] CPX Dr. Lemon.    Leita HILARIO Adie, MD [facility] [place]

## 2023-09-13 NOTE — Assessment & Plan Note (Signed)
 Followed by Orthopedics She is taking Robaxin and Mobic as needed. No falls or leg weakness.

## 2023-09-19 ENCOUNTER — Ambulatory Visit: Admitting: Internal Medicine

## 2023-11-11 ENCOUNTER — Ambulatory Visit: Admission: EM | Admit: 2023-11-11 | Discharge: 2023-11-11 | Disposition: A | Discharge status: Home / Self Care

## 2023-11-11 ENCOUNTER — Encounter: Payer: Self-pay | Admitting: Emergency Medicine

## 2023-11-11 DIAGNOSIS — M542 Cervicalgia: Secondary | ICD-10-CM | POA: Diagnosis not present

## 2023-11-11 NOTE — ED Provider Notes (Signed)
 [facility]    CSN: [id number] Arrival date & time: 11/11/23  9060      History   Chief Complaint Chief Complaint  Patient presents with   Neck Pain    HPI Else Natalie Gibbs is a 83 y.o. female with history of obesity, pre-diabetes, hypertension, and arthritis. Today, she is presenting for bilateral neck pain x 3 days. Patient reports waking up with the pain one morning. She believes she slept wrong. She has been applying ice and topical alcohol to neck. Pain does not radiate. No headaches, dizziness, chest pain, shortness of breath, or numbness/weakness. Increased pain with any movement of neck.   Patient takes meloxicam 15 mg and methocarbamol 500 mg 3 times daily as needed for hip osteoarthritis. She is unsure if she has recently taken these meds. She has a container with pills in it and is not sure if she includes these meds in her daily meds.  No other concerns.  HPI  Past Medical History:  Diagnosis Date   Allergy    Arthritis    Diffuse cystic mastopathy    Glaucoma    Hypertension    Obesity, unspecified    Pre-diabetes    Special screening for malignant neoplasms, colon     Patient Active Problem List   Diagnosis Date Noted   Primary osteoarthritis of right hip 09/13/2023   Essential hypertension 10/27/2021   Osteoarthritis of knees, bilateral 08/20/2021   Dyslipidemia 06/02/2021   Urine test positive for microalbuminuria 10/08/2020   Osteopenia determined by x-ray 04/05/2019   Abnormal mammogram of right breast 04/19/2017   Intraductal papilloma of breast 10/27/2016   Endometrial hyperplasia 06/14/2016   Prediabetes 05/15/2014   Allergy 05/15/2014   Fibrocystic breast 05/15/2014   Glaucoma 05/15/2014    Past Surgical History:  Procedure Laterality Date   BREAST BIOPSY Left 20 + yrs ago   benign   BREAST CYST ASPIRATION Left 2015   neg   BREAST DUCTAL SYSTEM EXCISION Left 10/26/2016   Intraductal papilloma Surgeon: Dellie Louanne MATSU, MD;  Location: [facility] ORS;  Service: General;  Laterality: Left;   CATARACT EXTRACTION, BILATERAL Bilateral 2017   COLONOSCOPY  2010   Dr. Ora, [facility]   COLONOSCOPY N/A 05/13/2014   Procedure: COLONOSCOPY;  Surgeon: Deward CINDERELLA Ora, MD;  Location: [facility] ENDOSCOPY;  Service: Gastroenterology;  Laterality: N/A;   COLONOSCOPY  04/28/2016   Dr. Sankar.   COLONOSCOPY WITH PROPOFOL  N/A 10/11/2017   Procedure: COLONOSCOPY WITH PROPOFOL ;  Surgeon: Toledo, Ladell POUR, MD;  Location: [facility] ENDOSCOPY;  Service: Gastroenterology;  Laterality: N/A;   excision mass abdominal wall  1997   EYE SURGERY  2017   bilateral   TUBAL LIGATION      OB History     Gravida  4   Para      Term      Preterm      AB      Living  4      SAB      IAB      Ectopic      Multiple      Live Births           Obstetric Comments  Age with first pregnancy-24          Home Medications    Prior to Admission medications   Medication Sig Start Date End Date Taking? Authorizing Provider  acetaminophen (TYLENOL) 650 MG CR tablet Take 1,300 mg by mouth every 8 (eight) hours  as needed for pain.    [provider]  Alcohol Swabs (B-D SINGLE USE SWABS REGULAR) PADS USE AS DIRECTED 05/19/18   Justus Leita DEL, MD  aspirin 81 MG tablet Take 81 mg by mouth daily.    [provider]  blood glucose meter kit and supplies Dispense based on patient and insurance preference. Use up to four times daily as directed. (FOR ICD-10 E10.9, E11.9). 10/19/21   Justus Leita DEL, MD  brimonidine (ALPHAGAN) 0.2 % ophthalmic solution Place 1 drop into both eyes 2 (two) times daily.     [provider]  calcium-vitamin D  (OSCAL WITH D) 500-200 MG-UNIT tablet Take 1 tablet by mouth.    [provider]  dorzolamide-timolol (COSOPT) 22.3-6.8 MG/ML ophthalmic solution Place 1 drop into both eyes 2 (two) times daily.    [provider]  glucose blood (ACCU-CHEK AVIVA) test strip Test  blood sugar once daily 08/07/19   Justus Leita DEL, MD  losartan  (COZAAR ) 25 MG tablet Take 1 tablet (25 mg total) by mouth daily. 02/14/23   Justus Leita DEL, MD  meloxicam (MOBIC) 15 MG tablet Mobic 15 mg tablet  Take 1 tablet(s) every day by oral route.    [provider]  methocarbamol (ROBAXIN) 500 MG tablet Take 500 mg by mouth 3 (three) times daily. 01/07/23   [provider]    Family History Family History  Problem Relation Age of Onset   Healthy Mother    Emphysema Father    Breast cancer Other        niece/breast   Diabetes Sister    Diabetes Sister    Breast cancer Other        niece   Kidney failure Brother    Multiple sclerosis Daughter     Social History Social History   Tobacco Use   Smoking status: Never   Smokeless tobacco: Never   Tobacco comments:    smoking cessation materials not required  Vaping Use   Vaping status: Never Used  Substance Use Topics   Alcohol use: No    Alcohol/week: 0.0 standard drinks of alcohol   Drug use: No     Allergies   Iodine, Shellfish allergy, and Codeine   Review of Systems Review of Systems  Constitutional:  Negative for fatigue.  Respiratory:  Negative for shortness of breath.   Cardiovascular:  Negative for chest pain.  Musculoskeletal:  Positive for neck pain and neck stiffness.  Skin:  Negative for color change, rash and wound.  Neurological:  Negative for dizziness, weakness, numbness and headaches.     Physical Exam Triage Vital Signs ED Triage Vitals  Encounter Vitals Group     BP 11/11/23 0948 125/79     Girls Systolic BP Percentile --      Girls Diastolic BP Percentile --      Boys Systolic BP Percentile --      Boys Diastolic BP Percentile --      Pulse Rate 11/11/23 0948 67     Resp 11/11/23 0948 14     Temp 11/11/23 0948 98.1 F (36.7 C)     Temp Source 11/11/23 0948 Oral     SpO2 11/11/23 0948 94 %     Weight 11/11/23 0946 162 lb 12.8 oz (73.8 kg)     Height 11/11/23  0946 5' 4 (1.626 m)     Head Circumference --      Peak Flow --      Pain Score 11/11/23  0945 8     Pain Loc --      Pain Education --      Exclude from Growth Chart --    No data found.  Updated Vital Signs BP 125/79 (BP Location: Right Arm)   Pulse 67   Temp 98.1 F (36.7 C) (Oral)   Resp 14   Ht 5' 4 (1.626 m)   Wt 162 lb 12.8 oz (73.8 kg)   SpO2 94%   BMI 27.94 kg/m    Physical Exam Vitals and nursing note reviewed.  Constitutional:      General: She is not in acute distress.    Appearance: Normal appearance. She is not ill-appearing or toxic-appearing.  HENT:     Head: Normocephalic and atraumatic.     Nose: Nose normal.     Mouth/Throat:     Mouth: Mucous membranes are moist.  Eyes:     General: No scleral icterus.       Right eye: No discharge.        Left eye: No discharge.     Conjunctiva/sclera: Conjunctivae normal.  Cardiovascular:     Rate and Rhythm: Normal rate and regular rhythm.     Heart sounds: Normal heart sounds.  Pulmonary:     Effort: Pulmonary effort is normal. No respiratory distress.     Breath sounds: Normal breath sounds.  Musculoskeletal:     Cervical back: Neck supple. Tenderness (bilateral paracervical muscles and traps) present. No deformity or bony tenderness. Pain with movement present. Decreased range of motion.  Skin:    General: Skin is dry.  Neurological:     General: No focal deficit present.     Mental Status: She is alert and oriented to person, place, and time. Mental status is at baseline.     Cranial Nerves: No cranial nerve deficit.     Motor: No weakness.     Coordination: Coordination normal.     Gait: Gait abnormal (walks with cane).  Psychiatric:        Mood and Affect: Mood normal.        Behavior: Behavior normal.      UC Treatments / Results  Labs (all labs ordered are listed, but only abnormal results are displayed) Labs Reviewed - No data to display  EKG   Radiology No results  found.  Procedures Procedures (including critical care time)  Medications Ordered in UC Medications - No data to display  Initial Impression / Assessment and Plan / UC Course  I have reviewed the triage vital signs and the nursing notes.  Pertinent labs & imaging results that were available during my care of the patient were reviewed by me and considered in my medical decision making (see chart for details).   83 y/o female presents for 3 day history of atraumatic bilateral neck pain. No radiation of pain, numbness/weakness, chest pain. Has been icing neck and applying topical alcohol. She is prescribed meloxicam and methocarbamol for hip OA. She is unsure if she has recently taken it.   On evaluation she has tenderness of bilateral paracervical muscles/traps. Reduced ROM of neck. Full ROM of shoulders. Normal neuro exam. Chest clear. Heart RRR.   Cervical neck pain/stiffness. Advised taking Meloxicam, methocarbamol, Tylenol and using heat, muscle rubs. Reviewed return and ED precautions.   Final Clinical Impressions(s) / UC Diagnoses   Final diagnoses:  Cervicalgia     Discharge Instructions      - You may take Tylenol or extra strength Tylenol  as needed for pain. - We also discussed topical muscle rubs like Bengay or IcyHot. - Use a heating pad to help relax the muscles. - Check at home medications and see if you are taking meloxicam/Mobic and Robaxin/methocarbamol.  These medications are anti-inflammatory medicines and muscle relaxers.  If you are not taking these medications go ahead and start them as it could be helpful.  Do not take any other NSAIDs such as Aleve or ibuprofen while taking meloxicam/Mobic.   NECK PAIN RED FLAGS: If symptoms get worse than they are right now, you should come back sooner for re-evaluation. If you have increased numbness/ tingling or notice that the numbness/tingling is affecting the legs or saddle region, go to ER. If you ever lose continence  go to ER.         ED Prescriptions   None    I have reviewed the PDMP during this encounter.   Arvis Jolan NOVAK, PA-C 11/11/23 [phone]

## 2023-11-11 NOTE — Discharge Instructions (Addendum)
-   You may take Tylenol or extra strength Tylenol as needed for pain. - We also discussed topical muscle rubs like Bengay or IcyHot. - Use a heating pad to help relax the muscles. - Check at home medications and see if you are taking meloxicam/Mobic and Robaxin/methocarbamol.  These medications are anti-inflammatory medicines and muscle relaxers.  If you are not taking these medications go ahead and start them as it could be helpful.  Do not take any other NSAIDs such as Aleve or ibuprofen while taking meloxicam/Mobic.   NECK PAIN RED FLAGS: If symptoms get worse than they are right now, you should come back sooner for re-evaluation. If you have increased numbness/ tingling or notice that the numbness/tingling is affecting the legs or saddle region, go to ER. If you ever lose continence go to ER.

## 2023-11-11 NOTE — ED Triage Notes (Signed)
 Patient c/o neck pain that started Tuesday morning when she woke up and got out of bed.  Patient put alcohol and ice on her neck for pain.

## 2023-12-08 ENCOUNTER — Other Ambulatory Visit: Payer: Self-pay | Admitting: Internal Medicine

## 2023-12-08 DIAGNOSIS — R809 Proteinuria, unspecified: Secondary | ICD-10-CM

## 2023-12-10 NOTE — Telephone Encounter (Signed)
 Requested Prescriptions  Refused Prescriptions Disp Refills   losartan  (COZAAR ) 25 MG tablet [Pharmacy Med Name: LOSARTAN  POTASSIUM 25 MG Oral Tablet] 90 tablet 3    Sig: TAKE 1 TABLET EVERY DAY     Cardiovascular:  Angiotensin Receptor Blockers Failed - 12/10/2023  6:39 PM      Failed - Cr in normal range and within 180 days    Creatinine  Date Value Ref Range Status  05/19/2012 0.70 0.60 - 1.30 mg/dL Final   Creatinine, Ser  Date Value Ref Range Status  02/14/2023 0.73 0.57 - 1.00 mg/dL Final         Failed - K in normal range and within 180 days    Potassium  Date Value Ref Range Status  02/14/2023 4.6 3.5 - 5.2 mmol/L Final  05/19/2012 4.0 3.5 - 5.1 mmol/L Final         Passed - Patient is not pregnant      Passed - Last BP in normal range    BP Readings from Last 1 Encounters:  11/11/23 125/79         Passed - Valid encounter within last 6 months    Recent Outpatient Visits           2 months ago Essential hypertension   [facility], Leita DEL, MD   9 months ago Annual physical exam   [facility], Leita DEL, MD

## 2024-03-12 ENCOUNTER — Ambulatory Visit: Admitting: Student

## 2024-03-12 ENCOUNTER — Encounter: Admitting: Student

## 2024-07-11 ENCOUNTER — Ambulatory Visit
